# Patient Record
Sex: Male | Born: 1935 | Race: White | Hispanic: No | Marital: Single | Smoking: Former smoker
Health system: Southern US, Community
[De-identification: ages and names within clinical notes are randomized; demographics above are authoritative.]

## PROBLEM LIST (undated history)

## (undated) DIAGNOSIS — L308 Other specified dermatitis: Secondary | ICD-10-CM

## (undated) DIAGNOSIS — I509 Heart failure, unspecified: Secondary | ICD-10-CM

## (undated) DIAGNOSIS — I781 Nevus, non-neoplastic: Secondary | ICD-10-CM

## (undated) DIAGNOSIS — C4A9 Merkel cell carcinoma, unspecified: Principal | ICD-10-CM

## (undated) DIAGNOSIS — R269 Unspecified abnormalities of gait and mobility: Secondary | ICD-10-CM

## (undated) DIAGNOSIS — Z973 Presence of spectacles and contact lenses: Secondary | ICD-10-CM

## (undated) DIAGNOSIS — C4359 Malignant melanoma of other part of trunk: Secondary | ICD-10-CM

## (undated) DIAGNOSIS — F419 Anxiety disorder, unspecified: Secondary | ICD-10-CM

## (undated) DIAGNOSIS — K219 Gastro-esophageal reflux disease without esophagitis: Secondary | ICD-10-CM

## (undated) DIAGNOSIS — R296 Repeated falls: Secondary | ICD-10-CM

## (undated) DIAGNOSIS — I10 Essential (primary) hypertension: Secondary | ICD-10-CM

## (undated) DIAGNOSIS — M1990 Unspecified osteoarthritis, unspecified site: Secondary | ICD-10-CM

## (undated) DIAGNOSIS — L814 Other melanin hyperpigmentation: Secondary | ICD-10-CM

## (undated) DIAGNOSIS — M21969 Unspecified acquired deformity of unspecified lower leg: Secondary | ICD-10-CM

## (undated) DIAGNOSIS — L578 Other skin changes due to chronic exposure to nonionizing radiation: Secondary | ICD-10-CM

## (undated) DIAGNOSIS — H3530 Unspecified macular degeneration: Secondary | ICD-10-CM

## (undated) DIAGNOSIS — D649 Anemia, unspecified: Secondary | ICD-10-CM

## (undated) DIAGNOSIS — C4490 Unspecified malignant neoplasm of skin, unspecified: Secondary | ICD-10-CM

## (undated) DIAGNOSIS — Z923 Personal history of irradiation: Secondary | ICD-10-CM

## (undated) DIAGNOSIS — L821 Other seborrheic keratosis: Secondary | ICD-10-CM

## (undated) DIAGNOSIS — K5900 Constipation, unspecified: Secondary | ICD-10-CM

## (undated) DIAGNOSIS — Z8709 Personal history of other diseases of the respiratory system: Secondary | ICD-10-CM

## (undated) DIAGNOSIS — E871 Hypo-osmolality and hyponatremia: Secondary | ICD-10-CM

## (undated) DIAGNOSIS — Z8619 Personal history of other infectious and parasitic diseases: Secondary | ICD-10-CM

## (undated) DIAGNOSIS — E876 Hypokalemia: Secondary | ICD-10-CM

## (undated) DIAGNOSIS — K449 Diaphragmatic hernia without obstruction or gangrene: Secondary | ICD-10-CM

## (undated) DIAGNOSIS — D229 Melanocytic nevi, unspecified: Secondary | ICD-10-CM

## (undated) DIAGNOSIS — I350 Nonrheumatic aortic (valve) stenosis: Secondary | ICD-10-CM

## (undated) DIAGNOSIS — R0602 Shortness of breath: Secondary | ICD-10-CM

## (undated) DIAGNOSIS — I4891 Unspecified atrial fibrillation: Secondary | ICD-10-CM

## (undated) DIAGNOSIS — J189 Pneumonia, unspecified organism: Secondary | ICD-10-CM

## (undated) DIAGNOSIS — L0390 Cellulitis, unspecified: Secondary | ICD-10-CM

## (undated) DIAGNOSIS — D1801 Hemangioma of skin and subcutaneous tissue: Secondary | ICD-10-CM

## (undated) HISTORY — DX: Repeated falls: R29.6

## (undated) HISTORY — DX: Hypo-osmolality and hyponatremia: E87.1

## (undated) HISTORY — DX: Diaphragmatic hernia without obstruction or gangrene: K44.9

## (undated) HISTORY — DX: Anemia, unspecified: D64.9

## (undated) HISTORY — DX: Constipation, unspecified: K59.00

## (undated) HISTORY — PX: KNEE SURGERY: SHX244

## (undated) HISTORY — PX: MELANOMA EXCISION: SHX5266

## (undated) HISTORY — DX: Unspecified abnormalities of gait and mobility: R26.9

## (undated) HISTORY — PX: CATARACT EXTRACTION W/ INTRAOCULAR LENS  IMPLANT, BILATERAL: SHX1307

## (undated) HISTORY — DX: Nonrheumatic aortic (valve) stenosis: I35.0

## (undated) HISTORY — DX: Personal history of irradiation: Z92.3

---

## 1998-10-08 ENCOUNTER — Encounter: Payer: Self-pay | Admitting: Emergency Medicine

## 1998-10-08 ENCOUNTER — Emergency Department (HOSPITAL_COMMUNITY): Admission: EM | Admit: 1998-10-08 | Discharge: 1998-10-08 | Payer: Self-pay | Admitting: Emergency Medicine

## 1999-11-05 ENCOUNTER — Ambulatory Visit (HOSPITAL_COMMUNITY): Admission: RE | Admit: 1999-11-05 | Discharge: 1999-11-05 | Payer: Self-pay | Admitting: Gastroenterology

## 1999-11-05 ENCOUNTER — Encounter: Payer: Self-pay | Admitting: Gastroenterology

## 1999-11-12 ENCOUNTER — Encounter (INDEPENDENT_AMBULATORY_CARE_PROVIDER_SITE_OTHER): Payer: Self-pay | Admitting: Specialist

## 1999-11-12 ENCOUNTER — Other Ambulatory Visit: Admission: RE | Admit: 1999-11-12 | Discharge: 1999-11-12 | Payer: Self-pay | Admitting: Urology

## 1999-12-01 ENCOUNTER — Encounter: Payer: Self-pay | Admitting: Gastroenterology

## 1999-12-01 ENCOUNTER — Encounter: Admission: RE | Admit: 1999-12-01 | Discharge: 1999-12-01 | Payer: Self-pay | Admitting: Gastroenterology

## 2000-02-02 ENCOUNTER — Encounter: Admission: RE | Admit: 2000-02-02 | Discharge: 2000-05-02 | Payer: Self-pay | Admitting: Internal Medicine

## 2000-02-08 ENCOUNTER — Encounter: Payer: Self-pay | Admitting: Orthopedic Surgery

## 2000-05-10 ENCOUNTER — Encounter: Admission: RE | Admit: 2000-05-10 | Discharge: 2000-08-08 | Payer: Self-pay | Admitting: Internal Medicine

## 2000-08-08 ENCOUNTER — Encounter: Admission: RE | Admit: 2000-08-08 | Discharge: 2000-11-06 | Payer: Self-pay | Admitting: Orthopedic Surgery

## 2000-08-15 ENCOUNTER — Encounter: Payer: Self-pay | Admitting: Orthopedic Surgery

## 2000-11-14 ENCOUNTER — Encounter: Admission: RE | Admit: 2000-11-14 | Discharge: 2000-12-13 | Payer: Self-pay | Admitting: Orthopedic Surgery

## 2000-12-14 ENCOUNTER — Encounter: Admission: RE | Admit: 2000-12-14 | Discharge: 2001-03-14 | Payer: Self-pay | Admitting: Orthopedic Surgery

## 2001-03-17 ENCOUNTER — Encounter: Admission: RE | Admit: 2001-03-17 | Discharge: 2001-06-15 | Payer: Self-pay | Admitting: Orthopedic Surgery

## 2001-06-21 ENCOUNTER — Encounter: Admission: RE | Admit: 2001-06-21 | Discharge: 2001-08-17 | Payer: Self-pay | Admitting: Orthopedic Surgery

## 2001-08-21 ENCOUNTER — Encounter (HOSPITAL_BASED_OUTPATIENT_CLINIC_OR_DEPARTMENT_OTHER): Admission: RE | Admit: 2001-08-21 | Discharge: 2001-11-19 | Payer: Self-pay | Admitting: Orthopedic Surgery

## 2001-12-10 ENCOUNTER — Encounter (HOSPITAL_BASED_OUTPATIENT_CLINIC_OR_DEPARTMENT_OTHER): Admission: RE | Admit: 2001-12-10 | Discharge: 2001-12-13 | Payer: Self-pay | Admitting: Internal Medicine

## 2002-01-29 ENCOUNTER — Encounter (HOSPITAL_BASED_OUTPATIENT_CLINIC_OR_DEPARTMENT_OTHER): Admission: RE | Admit: 2002-01-29 | Discharge: 2002-04-17 | Payer: Self-pay | Admitting: Internal Medicine

## 2002-02-26 ENCOUNTER — Encounter (HOSPITAL_BASED_OUTPATIENT_CLINIC_OR_DEPARTMENT_OTHER): Admission: RE | Admit: 2002-02-26 | Discharge: 2002-05-27 | Payer: Self-pay | Admitting: Internal Medicine

## 2002-06-18 ENCOUNTER — Encounter (HOSPITAL_BASED_OUTPATIENT_CLINIC_OR_DEPARTMENT_OTHER): Admission: RE | Admit: 2002-06-18 | Discharge: 2002-09-16 | Payer: Self-pay | Admitting: Internal Medicine

## 2002-09-30 ENCOUNTER — Encounter (HOSPITAL_BASED_OUTPATIENT_CLINIC_OR_DEPARTMENT_OTHER): Admission: RE | Admit: 2002-09-30 | Discharge: 2002-12-29 | Payer: Self-pay | Admitting: Internal Medicine

## 2003-01-01 ENCOUNTER — Encounter (HOSPITAL_BASED_OUTPATIENT_CLINIC_OR_DEPARTMENT_OTHER): Admission: RE | Admit: 2003-01-01 | Discharge: 2003-04-01 | Payer: Self-pay | Admitting: Internal Medicine

## 2003-04-03 ENCOUNTER — Encounter (HOSPITAL_BASED_OUTPATIENT_CLINIC_OR_DEPARTMENT_OTHER): Admission: RE | Admit: 2003-04-03 | Discharge: 2003-06-27 | Payer: Self-pay | Admitting: Internal Medicine

## 2003-07-04 ENCOUNTER — Encounter (HOSPITAL_BASED_OUTPATIENT_CLINIC_OR_DEPARTMENT_OTHER): Admission: RE | Admit: 2003-07-04 | Discharge: 2003-10-02 | Payer: Self-pay | Admitting: Internal Medicine

## 2004-01-19 ENCOUNTER — Emergency Department (HOSPITAL_COMMUNITY): Admission: EM | Admit: 2004-01-19 | Discharge: 2004-01-19 | Payer: Self-pay | Admitting: Emergency Medicine

## 2004-04-06 ENCOUNTER — Encounter (HOSPITAL_BASED_OUTPATIENT_CLINIC_OR_DEPARTMENT_OTHER): Admission: RE | Admit: 2004-04-06 | Discharge: 2004-07-05 | Payer: Self-pay | Admitting: Internal Medicine

## 2004-07-06 ENCOUNTER — Encounter (HOSPITAL_BASED_OUTPATIENT_CLINIC_OR_DEPARTMENT_OTHER): Admission: RE | Admit: 2004-07-06 | Discharge: 2004-08-06 | Payer: Self-pay | Admitting: Internal Medicine

## 2004-07-21 ENCOUNTER — Ambulatory Visit: Payer: Self-pay | Admitting: Family Medicine

## 2004-08-16 ENCOUNTER — Encounter (HOSPITAL_BASED_OUTPATIENT_CLINIC_OR_DEPARTMENT_OTHER): Admission: RE | Admit: 2004-08-16 | Discharge: 2004-11-14 | Payer: Self-pay | Admitting: Surgery

## 2004-11-22 ENCOUNTER — Encounter (HOSPITAL_BASED_OUTPATIENT_CLINIC_OR_DEPARTMENT_OTHER): Admission: RE | Admit: 2004-11-22 | Discharge: 2005-02-20 | Payer: Self-pay | Admitting: Surgery

## 2005-02-16 ENCOUNTER — Encounter (HOSPITAL_BASED_OUTPATIENT_CLINIC_OR_DEPARTMENT_OTHER): Admission: RE | Admit: 2005-02-16 | Discharge: 2005-05-17 | Payer: Self-pay | Admitting: Surgery

## 2005-02-18 ENCOUNTER — Ambulatory Visit: Payer: Self-pay | Admitting: Family Medicine

## 2005-05-20 ENCOUNTER — Encounter (HOSPITAL_BASED_OUTPATIENT_CLINIC_OR_DEPARTMENT_OTHER): Admission: RE | Admit: 2005-05-20 | Discharge: 2005-08-18 | Payer: Self-pay | Admitting: Surgery

## 2005-07-19 ENCOUNTER — Ambulatory Visit: Payer: Self-pay | Admitting: Family Medicine

## 2005-08-19 ENCOUNTER — Encounter (HOSPITAL_BASED_OUTPATIENT_CLINIC_OR_DEPARTMENT_OTHER): Admission: RE | Admit: 2005-08-19 | Discharge: 2005-11-17 | Payer: Self-pay | Admitting: Internal Medicine

## 2005-11-22 ENCOUNTER — Encounter (HOSPITAL_BASED_OUTPATIENT_CLINIC_OR_DEPARTMENT_OTHER): Admission: RE | Admit: 2005-11-22 | Discharge: 2006-02-02 | Payer: Self-pay | Admitting: Surgery

## 2006-01-12 ENCOUNTER — Ambulatory Visit: Payer: Self-pay | Admitting: Family Medicine

## 2006-02-07 ENCOUNTER — Encounter (HOSPITAL_BASED_OUTPATIENT_CLINIC_OR_DEPARTMENT_OTHER): Admission: RE | Admit: 2006-02-07 | Discharge: 2006-02-09 | Payer: Self-pay | Admitting: Surgery

## 2006-02-15 ENCOUNTER — Encounter (HOSPITAL_BASED_OUTPATIENT_CLINIC_OR_DEPARTMENT_OTHER): Admission: RE | Admit: 2006-02-15 | Discharge: 2006-02-22 | Payer: Self-pay | Admitting: Surgery

## 2006-03-03 ENCOUNTER — Ambulatory Visit: Payer: Self-pay | Admitting: Family Medicine

## 2006-03-28 ENCOUNTER — Ambulatory Visit: Payer: Self-pay | Admitting: Family Medicine

## 2006-03-31 ENCOUNTER — Encounter (HOSPITAL_BASED_OUTPATIENT_CLINIC_OR_DEPARTMENT_OTHER): Admission: RE | Admit: 2006-03-31 | Discharge: 2006-05-15 | Payer: Self-pay | Admitting: Internal Medicine

## 2006-04-14 ENCOUNTER — Ambulatory Visit: Payer: Self-pay | Admitting: Family Medicine

## 2006-05-16 ENCOUNTER — Encounter (HOSPITAL_BASED_OUTPATIENT_CLINIC_OR_DEPARTMENT_OTHER): Admission: RE | Admit: 2006-05-16 | Discharge: 2006-08-14 | Payer: Self-pay | Admitting: Surgery

## 2006-07-27 ENCOUNTER — Ambulatory Visit: Payer: Self-pay | Admitting: Family Medicine

## 2006-08-17 ENCOUNTER — Encounter (HOSPITAL_BASED_OUTPATIENT_CLINIC_OR_DEPARTMENT_OTHER): Admission: RE | Admit: 2006-08-17 | Discharge: 2006-11-15 | Payer: Self-pay | Admitting: Internal Medicine

## 2006-11-16 ENCOUNTER — Encounter (HOSPITAL_BASED_OUTPATIENT_CLINIC_OR_DEPARTMENT_OTHER): Admission: RE | Admit: 2006-11-16 | Discharge: 2007-02-01 | Payer: Self-pay | Admitting: Surgery

## 2007-01-17 ENCOUNTER — Inpatient Hospital Stay (HOSPITAL_COMMUNITY): Admission: EM | Admit: 2007-01-17 | Discharge: 2007-01-25 | Payer: Self-pay | Admitting: Emergency Medicine

## 2007-01-17 ENCOUNTER — Ambulatory Visit: Payer: Self-pay | Admitting: Internal Medicine

## 2007-01-18 ENCOUNTER — Ambulatory Visit: Payer: Self-pay | Admitting: Vascular Surgery

## 2007-07-02 ENCOUNTER — Ambulatory Visit: Payer: Self-pay | Admitting: Internal Medicine

## 2007-07-02 ENCOUNTER — Encounter (INDEPENDENT_AMBULATORY_CARE_PROVIDER_SITE_OTHER): Payer: Self-pay | Admitting: Family Medicine

## 2007-07-02 LAB — CONVERTED CEMR LAB
ALT: 13 units/L (ref 0–53)
AST: 17 units/L (ref 0–37)
Albumin: 4.7 g/dL (ref 3.5–5.2)
Alkaline Phosphatase: 57 units/L (ref 39–117)
BUN: 15 mg/dL (ref 6–23)
Basophils Absolute: 0.1 10*3/uL (ref 0.0–0.1)
Basophils Relative: 1 % (ref 0–1)
CO2: 23 meq/L (ref 19–32)
Calcium: 9.4 mg/dL (ref 8.4–10.5)
Chloride: 94 meq/L — ABNORMAL LOW (ref 96–112)
Cholesterol: 182 mg/dL (ref 0–200)
Creatinine, Ser: 0.76 mg/dL (ref 0.40–1.50)
Eosinophils Absolute: 0.4 10*3/uL (ref 0.0–0.7)
Eosinophils Relative: 3 % (ref 0–5)
Glucose, Bld: 91 mg/dL (ref 70–99)
HCT: 37 % — ABNORMAL LOW (ref 39.0–52.0)
HDL: 65 mg/dL (ref >39)
Hemoglobin: 12.5 g/dL — ABNORMAL LOW (ref 13.0–17.0)
LDL Cholesterol: 95 mg/dL (ref 0–99)
Lymphocytes Relative: 21 % (ref 12–46)
Lymphs Abs: 2.2 10*3/uL (ref 0.7–4.0)
MCHC: 33.8 g/dL (ref 30.0–36.0)
MCV: 87.5 fL (ref 78.0–100.0)
Monocytes Absolute: 0.7 10*3/uL (ref 0.1–1.0)
Monocytes Relative: 6 % (ref 3–12)
Neutro Abs: 7.3 10*3/uL (ref 1.7–7.7)
Neutrophils Relative %: 69 % (ref 43–77)
Platelets: 319 10*3/uL (ref 150–400)
Potassium: 4.4 meq/L (ref 3.5–5.3)
RBC: 4.23 M/uL (ref 4.22–5.81)
RDW: 12.8 % (ref 11.5–15.5)
Sodium: 133 meq/L — ABNORMAL LOW (ref 135–145)
Total Bilirubin: 0.4 mg/dL (ref 0.3–1.2)
Total CHOL/HDL Ratio: 2.8
Total Protein: 7.4 g/dL (ref 6.0–8.3)
Triglycerides: 112 mg/dL (ref <150)
VLDL: 22 mg/dL (ref 0–40)
WBC: 10.6 10*3/uL — ABNORMAL HIGH (ref 4.0–10.5)

## 2007-07-11 ENCOUNTER — Encounter (INDEPENDENT_AMBULATORY_CARE_PROVIDER_SITE_OTHER): Payer: Self-pay | Admitting: Nurse Practitioner

## 2007-07-11 ENCOUNTER — Ambulatory Visit: Payer: Self-pay | Admitting: Internal Medicine

## 2007-07-11 LAB — CONVERTED CEMR LAB
Ferritin: 142 ng/mL (ref 22–322)
Iron: 33 ug/dL — ABNORMAL LOW (ref 42–165)
Osmolality: 275 mOsm/kg (ref 275–300)
Saturation Ratios: 11 % — ABNORMAL LOW (ref 20–55)
TIBC: 291 ug/dL (ref 215–435)
UIBC: 258 ug/dL

## 2009-04-02 ENCOUNTER — Encounter (HOSPITAL_BASED_OUTPATIENT_CLINIC_OR_DEPARTMENT_OTHER): Admission: RE | Admit: 2009-04-02 | Discharge: 2009-05-18 | Payer: Self-pay | Admitting: Internal Medicine

## 2009-04-08 ENCOUNTER — Ambulatory Visit (HOSPITAL_COMMUNITY): Admission: RE | Admit: 2009-04-08 | Discharge: 2009-04-08 | Payer: Self-pay | Admitting: General Surgery

## 2009-05-19 ENCOUNTER — Encounter (HOSPITAL_BASED_OUTPATIENT_CLINIC_OR_DEPARTMENT_OTHER): Admission: RE | Admit: 2009-05-19 | Discharge: 2009-08-17 | Payer: Self-pay | Admitting: Internal Medicine

## 2009-07-20 ENCOUNTER — Encounter (HOSPITAL_BASED_OUTPATIENT_CLINIC_OR_DEPARTMENT_OTHER): Payer: Self-pay | Admitting: Internal Medicine

## 2009-07-20 ENCOUNTER — Ambulatory Visit: Payer: Self-pay | Admitting: Vascular Surgery

## 2009-07-20 ENCOUNTER — Ambulatory Visit: Admission: RE | Admit: 2009-07-20 | Discharge: 2009-07-20 | Payer: Self-pay | Admitting: Internal Medicine

## 2009-07-22 ENCOUNTER — Inpatient Hospital Stay (HOSPITAL_COMMUNITY): Admission: EM | Admit: 2009-07-22 | Discharge: 2009-07-27 | Payer: Self-pay | Admitting: Emergency Medicine

## 2009-08-21 ENCOUNTER — Encounter (HOSPITAL_BASED_OUTPATIENT_CLINIC_OR_DEPARTMENT_OTHER): Admission: RE | Admit: 2009-08-21 | Discharge: 2009-11-13 | Payer: Self-pay | Admitting: Internal Medicine

## 2009-08-31 ENCOUNTER — Emergency Department (HOSPITAL_COMMUNITY): Admission: EM | Admit: 2009-08-31 | Discharge: 2009-08-31 | Payer: Self-pay | Admitting: Emergency Medicine

## 2009-09-10 ENCOUNTER — Ambulatory Visit: Payer: Self-pay | Admitting: Surgery

## 2009-09-10 ENCOUNTER — Inpatient Hospital Stay (HOSPITAL_COMMUNITY): Admission: EM | Admit: 2009-09-10 | Discharge: 2009-09-15 | Payer: Self-pay | Admitting: Emergency Medicine

## 2009-09-10 ENCOUNTER — Encounter (INDEPENDENT_AMBULATORY_CARE_PROVIDER_SITE_OTHER): Payer: Self-pay | Admitting: Internal Medicine

## 2009-09-14 ENCOUNTER — Encounter (INDEPENDENT_AMBULATORY_CARE_PROVIDER_SITE_OTHER): Payer: Self-pay | Admitting: Internal Medicine

## 2009-11-20 ENCOUNTER — Encounter (HOSPITAL_BASED_OUTPATIENT_CLINIC_OR_DEPARTMENT_OTHER): Admission: RE | Admit: 2009-11-20 | Discharge: 2010-02-18 | Payer: Self-pay | Admitting: Internal Medicine

## 2010-02-22 ENCOUNTER — Encounter (HOSPITAL_BASED_OUTPATIENT_CLINIC_OR_DEPARTMENT_OTHER)
Admission: RE | Admit: 2010-02-22 | Discharge: 2010-05-20 | Payer: Self-pay | Source: Home / Self Care | Attending: Internal Medicine | Admitting: Internal Medicine

## 2010-05-21 ENCOUNTER — Encounter (HOSPITAL_BASED_OUTPATIENT_CLINIC_OR_DEPARTMENT_OTHER)
Admission: RE | Admit: 2010-05-21 | Discharge: 2010-06-15 | Payer: Self-pay | Source: Home / Self Care | Attending: Internal Medicine | Admitting: Internal Medicine

## 2010-05-30 ENCOUNTER — Emergency Department (HOSPITAL_COMMUNITY)
Admission: EM | Admit: 2010-05-30 | Discharge: 2010-05-31 | Disposition: A | Discharge status: Other Acute Inpatient Hospital | Payer: Self-pay | Source: Home / Self Care | Admitting: Emergency Medicine

## 2010-05-31 LAB — CBC
HCT: 32.5 % — ABNORMAL LOW (ref 39.0–52.0)
Hemoglobin: 10.8 g/dL — ABNORMAL LOW (ref 13.0–17.0)
MCH: 30.2 pg (ref 26.0–34.0)
MCHC: 33.2 g/dL (ref 30.0–36.0)
MCV: 90.8 fL (ref 78.0–100.0)
Platelets: 214 10*3/uL (ref 150–400)
RBC: 3.58 MIL/uL — ABNORMAL LOW (ref 4.22–5.81)
RDW: 13.7 % (ref 11.5–15.5)
WBC: 8.9 10*3/uL (ref 4.0–10.5)

## 2010-05-31 LAB — URINALYSIS, ROUTINE W REFLEX MICROSCOPIC
Bilirubin Urine: NEGATIVE
Hgb urine dipstick: NEGATIVE
Ketones, ur: NEGATIVE mg/dL
Nitrite: NEGATIVE
Protein, ur: NEGATIVE mg/dL
Specific Gravity, Urine: 1.009 (ref 1.005–1.030)
Urine Glucose, Fasting: NEGATIVE mg/dL
Urobilinogen, UA: 0.2 mg/dL (ref 0.0–1.0)
pH: 7.5 (ref 5.0–8.0)

## 2010-05-31 LAB — COMPREHENSIVE METABOLIC PANEL
ALT: 19 U/L (ref 0–53)
AST: 35 U/L (ref 0–37)
Albumin: 3.7 g/dL (ref 3.5–5.2)
Alkaline Phosphatase: 53 U/L (ref 39–117)
BUN: 18 mg/dL (ref 6–23)
CO2: 27 mEq/L (ref 19–32)
Calcium: 8.9 mg/dL (ref 8.4–10.5)
Chloride: 102 mEq/L (ref 96–112)
Creatinine, Ser: 0.81 mg/dL (ref 0.4–1.5)
GFR calc Af Amer: >60 mL/min (ref >60)
GFR calc non Af Amer: >60 mL/min (ref >60)
Glucose, Bld: 113 mg/dL — ABNORMAL HIGH (ref 70–99)
Potassium: 4.5 mEq/L (ref 3.5–5.1)
Sodium: 137 mEq/L (ref 135–145)
Total Bilirubin: 0.9 mg/dL (ref 0.3–1.2)
Total Protein: 7.1 g/dL (ref 6.0–8.3)

## 2010-05-31 LAB — POCT CARDIAC MARKERS
CKMB, poc: 2.3 ng/mL (ref 1.0–8.0)
CKMB, poc: 2.7 ng/mL (ref 1.0–8.0)
Myoglobin, poc: 129 ng/mL (ref 12–200)
Myoglobin, poc: 81 ng/mL (ref 12–200)
Troponin i, poc: <0.05 ng/mL (ref 0.00–0.09)
Troponin i, poc: <0.05 ng/mL (ref 0.00–0.09)

## 2010-05-31 LAB — DIFFERENTIAL
Basophils Absolute: 0 10*3/uL (ref 0.0–0.1)
Basophils Relative: 0 % (ref 0–1)
Eosinophils Absolute: 0.2 10*3/uL (ref 0.0–0.7)
Eosinophils Relative: 2 % (ref 0–5)
Lymphocytes Relative: 12 % (ref 12–46)
Lymphs Abs: 1.1 10*3/uL (ref 0.7–4.0)
Monocytes Absolute: 0.5 10*3/uL (ref 0.1–1.0)
Monocytes Relative: 6 % (ref 3–12)
Neutro Abs: 7 10*3/uL (ref 1.7–7.7)
Neutrophils Relative %: 79 % — ABNORMAL HIGH (ref 43–77)

## 2010-06-02 LAB — BRAIN NATRIURETIC PEPTIDE: Pro B Natriuretic peptide (BNP): 407 pg/mL — ABNORMAL HIGH (ref 0.0–100.0)

## 2010-07-05 ENCOUNTER — Encounter (HOSPITAL_BASED_OUTPATIENT_CLINIC_OR_DEPARTMENT_OTHER): Payer: Medicare Other | Attending: Plastic Surgery

## 2010-07-05 DIAGNOSIS — L97409 Non-pressure chronic ulcer of unspecified heel and midfoot with unspecified severity: Secondary | ICD-10-CM | POA: Insufficient documentation

## 2010-07-05 DIAGNOSIS — G5790 Unspecified mononeuropathy of unspecified lower limb: Secondary | ICD-10-CM | POA: Insufficient documentation

## 2010-08-02 ENCOUNTER — Encounter (HOSPITAL_BASED_OUTPATIENT_CLINIC_OR_DEPARTMENT_OTHER): Payer: Medicare Other | Attending: Plastic Surgery

## 2010-08-02 DIAGNOSIS — G5790 Unspecified mononeuropathy of unspecified lower limb: Secondary | ICD-10-CM | POA: Insufficient documentation

## 2010-08-02 DIAGNOSIS — L97409 Non-pressure chronic ulcer of unspecified heel and midfoot with unspecified severity: Secondary | ICD-10-CM | POA: Insufficient documentation

## 2010-08-03 LAB — DIFFERENTIAL
Basophils Absolute: 0 10*3/uL (ref 0.0–0.1)
Basophils Relative: 0 % (ref 0–1)
Eosinophils Absolute: 0 10*3/uL (ref 0.0–0.7)
Lymphocytes Relative: 1 % — ABNORMAL LOW (ref 12–46)
Lymphocytes Relative: 2 % — ABNORMAL LOW (ref 12–46)
Monocytes Relative: 2 % — ABNORMAL LOW (ref 3–12)
Monocytes Relative: 3 % (ref 3–12)
Neutro Abs: 21.5 10*3/uL — ABNORMAL HIGH (ref 1.7–7.7)
Neutro Abs: 26 10*3/uL — ABNORMAL HIGH (ref 1.7–7.7)
Neutrophils Relative %: 96 % — ABNORMAL HIGH (ref 43–77)
WBC Morphology: INCREASED

## 2010-08-03 LAB — HEMOGLOBIN A1C: Mean Plasma Glucose: 123 mg/dL — ABNORMAL HIGH (ref <117)

## 2010-08-03 LAB — BASIC METABOLIC PANEL
BUN: 14 mg/dL (ref 6–23)
BUN: 18 mg/dL (ref 6–23)
CO2: 30 mEq/L (ref 19–32)
Calcium: 8.1 mg/dL — ABNORMAL LOW (ref 8.4–10.5)
Calcium: 8.2 mg/dL — ABNORMAL LOW (ref 8.4–10.5)
Calcium: 8.9 mg/dL (ref 8.4–10.5)
Creatinine, Ser: 0.96 mg/dL (ref 0.4–1.5)
Creatinine, Ser: 1.04 mg/dL (ref 0.4–1.5)
GFR calc Af Amer: >60 mL/min (ref >60)
GFR calc non Af Amer: >60 mL/min (ref >60)
GFR calc non Af Amer: >60 mL/min (ref >60)
Glucose, Bld: 142 mg/dL — ABNORMAL HIGH (ref 70–99)
Potassium: 3.7 mEq/L (ref 3.5–5.1)
Sodium: 137 mEq/L (ref 135–145)

## 2010-08-03 LAB — CBC
HCT: 26.3 % — ABNORMAL LOW (ref 39.0–52.0)
HCT: 22.2 % — ABNORMAL LOW (ref 39.0–52.0)
Hemoglobin: 8.8 g/dL — ABNORMAL LOW (ref 13.0–17.0)
Hemoglobin: 7.7 g/dL — ABNORMAL LOW (ref 13.0–17.0)
MCHC: 33.3 g/dL (ref 30.0–36.0)
MCHC: 33.7 g/dL (ref 30.0–36.0)
MCV: 91 fL (ref 78.0–100.0)
MCV: 90.6 fL (ref 78.0–100.0)
Platelets: 232 10*3/uL (ref 150–400)
Platelets: 174 10*3/uL (ref 150–400)
Platelets: 200 10*3/uL (ref 150–400)
Platelets: 235 10*3/uL (ref 150–400)
RBC: 2.89 MIL/uL — ABNORMAL LOW (ref 4.22–5.81)
RBC: 2.5 MIL/uL — ABNORMAL LOW (ref 4.22–5.81)
RDW: 15.6 % — ABNORMAL HIGH (ref 11.5–15.5)
RDW: 15.7 % — ABNORMAL HIGH (ref 11.5–15.5)
RDW: 16.1 % — ABNORMAL HIGH (ref 11.5–15.5)
WBC: 7.4 10*3/uL (ref 4.0–10.5)
WBC: 11.7 10*3/uL — ABNORMAL HIGH (ref 4.0–10.5)
WBC: 27.1 10*3/uL — ABNORMAL HIGH (ref 4.0–10.5)

## 2010-08-03 LAB — CULTURE, BLOOD (ROUTINE X 2)

## 2010-08-03 LAB — GLUCOSE, CAPILLARY
Glucose-Capillary: 117 mg/dL — ABNORMAL HIGH (ref 70–99)
Glucose-Capillary: 124 mg/dL — ABNORMAL HIGH (ref 70–99)

## 2010-08-03 LAB — POCT CARDIAC MARKERS
CKMB, poc: 1.1 ng/mL (ref 1.0–8.0)
Myoglobin, poc: 112 ng/mL (ref 12–200)

## 2010-08-03 LAB — COMPREHENSIVE METABOLIC PANEL
AST: 17 U/L (ref 0–37)
Albumin: 3 g/dL — ABNORMAL LOW (ref 3.5–5.2)
Alkaline Phosphatase: 38 U/L — ABNORMAL LOW (ref 39–117)
BUN: 17 mg/dL (ref 6–23)
Creatinine, Ser: 1.17 mg/dL (ref 0.4–1.5)
GFR calc Af Amer: >60 mL/min (ref >60)
Potassium: 3.7 mEq/L (ref 3.5–5.1)
Total Protein: 6.2 g/dL (ref 6.0–8.3)

## 2010-08-03 LAB — BRAIN NATRIURETIC PEPTIDE: Pro B Natriuretic peptide (BNP): 682 pg/mL — ABNORMAL HIGH (ref 0.0–100.0)

## 2010-08-03 LAB — URINE CULTURE: Colony Count: 7000

## 2010-08-03 LAB — IRON AND TIBC
Iron: 17 ug/dL — ABNORMAL LOW (ref 42–135)
Saturation Ratios: 10 % — ABNORMAL LOW (ref 20–55)
TIBC: 178 ug/dL — ABNORMAL LOW (ref 215–435)

## 2010-08-03 LAB — URINALYSIS, ROUTINE W REFLEX MICROSCOPIC
Bilirubin Urine: NEGATIVE
Glucose, UA: NEGATIVE mg/dL
Specific Gravity, Urine: 1.014 (ref 1.005–1.030)
Urobilinogen, UA: 0.2 mg/dL (ref 0.0–1.0)

## 2010-08-03 LAB — VITAMIN B12: Vitamin B-12: 434 pg/mL (ref 211–911)

## 2010-08-03 LAB — RETICULOCYTES: Retic Ct Pct: 1.5 % (ref 0.4–3.1)

## 2010-08-03 LAB — LACTIC ACID, PLASMA: Lactic Acid, Venous: 1.4 mmol/L (ref 0.5–2.2)

## 2010-08-03 LAB — TSH: TSH: 0.86 u[IU]/mL (ref 0.350–4.500)

## 2010-08-03 LAB — URINE MICROSCOPIC-ADD ON

## 2010-08-08 LAB — BASIC METABOLIC PANEL
BUN: 12 mg/dL (ref 6–23)
CO2: 25 mEq/L (ref 19–32)
CO2: 25 mEq/L (ref 19–32)
CO2: 26 mEq/L (ref 19–32)
Calcium: 8 mg/dL — ABNORMAL LOW (ref 8.4–10.5)
Calcium: 8.2 mg/dL — ABNORMAL LOW (ref 8.4–10.5)
Calcium: 8.2 mg/dL — ABNORMAL LOW (ref 8.4–10.5)
Chloride: 95 mEq/L — ABNORMAL LOW (ref 96–112)
Chloride: 98 mEq/L (ref 96–112)
Chloride: 98 mEq/L (ref 96–112)
Creatinine, Ser: 1.47 mg/dL (ref 0.4–1.5)
Creatinine, Ser: 1.5 mg/dL (ref 0.4–1.5)
Creatinine, Ser: 1.62 mg/dL — ABNORMAL HIGH (ref 0.4–1.5)
GFR calc Af Amer: 50 mL/min — ABNORMAL LOW (ref >60)
GFR calc Af Amer: 54 mL/min — ABNORMAL LOW (ref >60)
GFR calc Af Amer: 54 mL/min — ABNORMAL LOW (ref >60)
GFR calc non Af Amer: 41 mL/min — ABNORMAL LOW (ref >60)
GFR calc non Af Amer: 45 mL/min — ABNORMAL LOW (ref >60)
Glucose, Bld: 109 mg/dL — ABNORMAL HIGH (ref 70–99)
Glucose, Bld: 118 mg/dL — ABNORMAL HIGH (ref 70–99)
Glucose, Bld: 89 mg/dL (ref 70–99)
Potassium: 4.3 mEq/L (ref 3.5–5.1)
Potassium: 4.4 mEq/L (ref 3.5–5.1)
Potassium: 4.6 mEq/L (ref 3.5–5.1)
Sodium: 129 mEq/L — ABNORMAL LOW (ref 135–145)
Sodium: 129 mEq/L — ABNORMAL LOW (ref 135–145)
Sodium: 131 mEq/L — ABNORMAL LOW (ref 135–145)
Sodium: 133 mEq/L — ABNORMAL LOW (ref 135–145)

## 2010-08-08 LAB — URINE CULTURE: Culture: NO GROWTH

## 2010-08-08 LAB — CULTURE, BLOOD (ROUTINE X 2): Culture: NO GROWTH

## 2010-08-08 LAB — URINALYSIS, ROUTINE W REFLEX MICROSCOPIC
Leukocytes, UA: NEGATIVE
Nitrite: NEGATIVE
Protein, ur: NEGATIVE mg/dL
Specific Gravity, Urine: 1.011 (ref 1.005–1.030)
Urobilinogen, UA: 0.2 mg/dL (ref 0.0–1.0)

## 2010-08-08 LAB — DIFFERENTIAL
Basophils Absolute: 0 10*3/uL (ref 0.0–0.1)
Basophils Relative: 0 % (ref 0–1)
Eosinophils Absolute: 0.2 10*3/uL (ref 0.0–0.7)
Eosinophils Relative: 2 % (ref 0–5)
Monocytes Absolute: 0.6 10*3/uL (ref 0.1–1.0)

## 2010-08-08 LAB — CBC
HCT: 27.9 % — ABNORMAL LOW (ref 39.0–52.0)
HCT: 30.4 % — ABNORMAL LOW (ref 39.0–52.0)
Hemoglobin: 9.1 g/dL — ABNORMAL LOW (ref 13.0–17.0)
Hemoglobin: 9.2 g/dL — ABNORMAL LOW (ref 13.0–17.0)
Hemoglobin: 9.9 g/dL — ABNORMAL LOW (ref 13.0–17.0)
MCHC: 32.4 g/dL (ref 30.0–36.0)
MCV: 90 fL (ref 78.0–100.0)
MCV: 90.7 fL (ref 78.0–100.0)
Platelets: 393 10*3/uL (ref 150–400)
RBC: 3.09 MIL/uL — ABNORMAL LOW (ref 4.22–5.81)
RDW: 14.2 % (ref 11.5–15.5)
RDW: 14.2 % (ref 11.5–15.5)
WBC: 9 10*3/uL (ref 4.0–10.5)

## 2010-08-08 LAB — WOUND CULTURE: Culture: NO GROWTH

## 2010-08-08 LAB — TSH: TSH: 0.721 u[IU]/mL (ref 0.350–4.500)

## 2010-08-08 LAB — URINE MICROSCOPIC-ADD ON

## 2010-08-21 ENCOUNTER — Inpatient Hospital Stay (HOSPITAL_COMMUNITY)
Admission: EM | Admit: 2010-08-21 | Discharge: 2010-08-26 | DRG: 309 | Disposition: A | Discharge status: Home Health | Payer: Medicare Other | Attending: Internal Medicine | Admitting: Internal Medicine

## 2010-08-21 ENCOUNTER — Emergency Department (HOSPITAL_COMMUNITY): Payer: Medicare Other

## 2010-08-21 DIAGNOSIS — I4891 Unspecified atrial fibrillation: Principal | ICD-10-CM | POA: Diagnosis present

## 2010-08-21 DIAGNOSIS — J209 Acute bronchitis, unspecified: Secondary | ICD-10-CM | POA: Diagnosis present

## 2010-08-21 DIAGNOSIS — I080 Rheumatic disorders of both mitral and aortic valves: Secondary | ICD-10-CM | POA: Diagnosis present

## 2010-08-21 DIAGNOSIS — A5211 Tabes dorsalis: Secondary | ICD-10-CM | POA: Diagnosis present

## 2010-08-21 DIAGNOSIS — K7689 Other specified diseases of liver: Secondary | ICD-10-CM | POA: Diagnosis present

## 2010-08-21 DIAGNOSIS — E871 Hypo-osmolality and hyponatremia: Secondary | ICD-10-CM | POA: Diagnosis present

## 2010-08-21 DIAGNOSIS — N400 Enlarged prostate without lower urinary tract symptoms: Secondary | ICD-10-CM | POA: Diagnosis present

## 2010-08-21 DIAGNOSIS — F411 Generalized anxiety disorder: Secondary | ICD-10-CM | POA: Diagnosis present

## 2010-08-21 DIAGNOSIS — F341 Dysthymic disorder: Secondary | ICD-10-CM | POA: Diagnosis present

## 2010-08-21 DIAGNOSIS — D649 Anemia, unspecified: Secondary | ICD-10-CM | POA: Diagnosis present

## 2010-08-21 DIAGNOSIS — I10 Essential (primary) hypertension: Secondary | ICD-10-CM | POA: Diagnosis present

## 2010-08-21 DIAGNOSIS — K449 Diaphragmatic hernia without obstruction or gangrene: Secondary | ICD-10-CM | POA: Diagnosis present

## 2010-08-22 ENCOUNTER — Inpatient Hospital Stay (HOSPITAL_COMMUNITY): Payer: Medicare Other

## 2010-08-22 DIAGNOSIS — I059 Rheumatic mitral valve disease, unspecified: Secondary | ICD-10-CM

## 2010-08-22 DIAGNOSIS — I4891 Unspecified atrial fibrillation: Secondary | ICD-10-CM

## 2010-08-22 LAB — POCT I-STAT, CHEM 8
Calcium, Ion: 1.14 mmol/L (ref 1.12–1.32)
Chloride: 97 mEq/L (ref 96–112)
Creatinine, Ser: 1 mg/dL (ref 0.4–1.5)
Glucose, Bld: 102 mg/dL — ABNORMAL HIGH (ref 70–99)
HCT: 35 % — ABNORMAL LOW (ref 39.0–52.0)
Potassium: 4 mEq/L (ref 3.5–5.1)

## 2010-08-22 LAB — CBC
HCT: 33.1 % — ABNORMAL LOW (ref 39.0–52.0)
HCT: 33.3 % — ABNORMAL LOW (ref 39.0–52.0)
MCH: 30.3 pg (ref 26.0–34.0)
MCHC: 32.6 g/dL (ref 30.0–36.0)
MCHC: 33 g/dL (ref 30.0–36.0)
MCV: 91.7 fL (ref 78.0–100.0)
Platelets: 198 10*3/uL (ref 150–400)
Platelets: 209 10*3/uL (ref 150–400)
RDW: 13 % (ref 11.5–15.5)
RDW: 13.1 % (ref 11.5–15.5)

## 2010-08-22 LAB — PROTIME-INR: INR: 1.07 (ref 0.00–1.49)

## 2010-08-22 LAB — BASIC METABOLIC PANEL
BUN: 15 mg/dL (ref 6–23)
Calcium: 8.3 mg/dL — ABNORMAL LOW (ref 8.4–10.5)
Chloride: 97 mEq/L (ref 96–112)
Creatinine, Ser: 0.78 mg/dL (ref 0.4–1.5)

## 2010-08-22 LAB — CARDIAC PANEL(CRET KIN+CKTOT+MB+TROPI)
Relative Index: INVALID (ref 0.0–2.5)
Total CK: 92 U/L (ref 7–232)

## 2010-08-22 LAB — CK TOTAL AND CKMB (NOT AT ARMC)
CK, MB: 3.6 ng/mL (ref 0.3–4.0)
Relative Index: INVALID (ref 0.0–2.5)
Relative Index: INVALID (ref 0.0–2.5)
Total CK: 85 U/L (ref 7–232)

## 2010-08-22 LAB — DIFFERENTIAL
Eosinophils Absolute: 0.2 10*3/uL (ref 0.0–0.7)
Eosinophils Relative: 2 % (ref 0–5)
Lymphocytes Relative: 18 % (ref 12–46)
Lymphs Abs: 1.6 10*3/uL (ref 0.7–4.0)
Monocytes Absolute: 0.6 10*3/uL (ref 0.1–1.0)

## 2010-08-22 LAB — TROPONIN I: Troponin I: 0.01 ng/mL (ref 0.00–0.06)

## 2010-08-22 LAB — MRSA PCR SCREENING: MRSA by PCR: NEGATIVE

## 2010-08-22 LAB — HEPARIN LEVEL (UNFRACTIONATED): Heparin Unfractionated: 0.34 IU/mL (ref 0.30–0.70)

## 2010-08-22 LAB — TSH: TSH: 1.179 u[IU]/mL (ref 0.350–4.500)

## 2010-08-22 MED ORDER — IOHEXOL 300 MG/ML  SOLN
100.0000 mL | Freq: Once | INTRAMUSCULAR | Status: AC | PRN
  Administered 2010-08-22: 100 mL via INTRAVENOUS

## 2010-08-23 LAB — CBC
MCH: 29.8 pg (ref 26.0–34.0)
MCHC: 32.8 g/dL (ref 30.0–36.0)
Platelets: 234 10*3/uL (ref 150–400)
RBC: 3.79 MIL/uL — ABNORMAL LOW (ref 4.22–5.81)

## 2010-08-23 LAB — PROTIME-INR: INR: 1.07 (ref 0.00–1.49)

## 2010-08-23 LAB — BASIC METABOLIC PANEL
BUN: 11 mg/dL (ref 6–23)
Creatinine, Ser: 0.72 mg/dL (ref 0.4–1.5)
GFR calc non Af Amer: >60 mL/min (ref >60)

## 2010-08-23 LAB — HEPARIN LEVEL (UNFRACTIONATED)
Heparin Unfractionated: <0.1 IU/mL — ABNORMAL LOW (ref 0.30–0.70)
Heparin Unfractionated: 0.25 IU/mL — ABNORMAL LOW (ref 0.30–0.70)

## 2010-08-23 LAB — LIPID PANEL: Cholesterol: 122 mg/dL (ref 0–200)

## 2010-08-23 LAB — CARDIAC PANEL(CRET KIN+CKTOT+MB+TROPI)
CK, MB: 5.3 ng/mL — ABNORMAL HIGH (ref 0.3–4.0)
Total CK: 253 U/L — ABNORMAL HIGH (ref 7–232)
Troponin I: 0.02 ng/mL (ref 0.00–0.06)

## 2010-08-24 ENCOUNTER — Inpatient Hospital Stay (HOSPITAL_COMMUNITY): Payer: Medicare Other

## 2010-08-24 LAB — CARDIAC PANEL(CRET KIN+CKTOT+MB+TROPI)
CK, MB: 3.9 ng/mL (ref 0.3–4.0)
Relative Index: 2.1 (ref 0.0–2.5)
Total CK: 204 U/L (ref 7–232)
Troponin I: 0.03 ng/mL (ref 0.00–0.06)

## 2010-08-24 LAB — BASIC METABOLIC PANEL
BUN: 10 mg/dL (ref 6–23)
CO2: 28 mEq/L (ref 19–32)
Calcium: 8 mg/dL — ABNORMAL LOW (ref 8.4–10.5)
Chloride: 96 mEq/L (ref 96–112)
Creatinine, Ser: 0.74 mg/dL (ref 0.4–1.5)

## 2010-08-24 LAB — OSMOLALITY: Osmolality: 260 mOsm/kg — ABNORMAL LOW (ref 275–300)

## 2010-08-25 LAB — CBC
HCT: 34 % — ABNORMAL LOW (ref 39.0–52.0)
Hemoglobin: 11.3 g/dL — ABNORMAL LOW (ref 13.0–17.0)
MCH: 29.8 pg (ref 26.0–34.0)
MCV: 89.7 fL (ref 78.0–100.0)
RBC: 3.79 MIL/uL — ABNORMAL LOW (ref 4.22–5.81)

## 2010-08-25 LAB — BASIC METABOLIC PANEL
Chloride: 96 mEq/L (ref 96–112)
Creatinine, Ser: 0.73 mg/dL (ref 0.4–1.5)
GFR calc Af Amer: >60 mL/min (ref >60)
Potassium: 4.4 mEq/L (ref 3.5–5.1)
Sodium: 132 mEq/L — ABNORMAL LOW (ref 135–145)

## 2010-08-25 LAB — PROTIME-INR: Prothrombin Time: 16.6 seconds — ABNORMAL HIGH (ref 11.6–15.2)

## 2010-08-25 LAB — BRAIN NATRIURETIC PEPTIDE: Pro B Natriuretic peptide (BNP): 215 pg/mL — ABNORMAL HIGH (ref 0.0–100.0)

## 2010-08-26 LAB — BASIC METABOLIC PANEL
BUN: 10 mg/dL (ref 6–23)
Calcium: 8.9 mg/dL (ref 8.4–10.5)
GFR calc non Af Amer: >60 mL/min (ref >60)
Glucose, Bld: 96 mg/dL (ref 70–99)
Sodium: 132 mEq/L — ABNORMAL LOW (ref 135–145)

## 2010-08-26 LAB — PROTIME-INR
INR: 1.47 (ref 0.00–1.49)
Prothrombin Time: 18 seconds — ABNORMAL HIGH (ref 11.6–15.2)

## 2010-08-28 NOTE — Discharge Summary (Signed)
NAME:  [name], [name]               ACCOUNT NO.:  [id number]  MEDICAL RECORD NO.:  [id number]           PATIENT TYPE:  I  LOCATION:  1424                         FACILITY:  [facility]  PHYSICIAN:  Hillery Aldo, M.D.   DATE OF BIRTH:  [birth year]  DATE OF ADMISSION:  08/21/2010 DATE OF DISCHARGE:  08/26/2010                              DISCHARGE SUMMARY   PRIMARY CARE PHYSICIAN:  Dr. Florentina Jenny.  CARDIOLOGIST:  Dr. Roger Shelter.  DISCHARGE DIAGNOSES: 1. Atrial fibrillation with rapid ventricular response. 2. Acute bronchitis. 3. Chronic mild hyponatremia. 4. Charcot joint with chronic wound of the bilateral plantar surface     of the feet. 5. Hypertension. 6. Benign prostatic hypertrophy. 7. Hiatal hernia. 8. Chronic normocytic anemia. 9. Generalized anxiety disorder. 10.Indeterminate liver lesion, outpatient MRI with contrast     recommended for further characterization.  DISCHARGE MEDICATIONS: 1. Azithromycin 250 mg 2 tabs p.o. daily x2 more days. 2. Tessalon Perles 100 mg p.o. q.8 h. p.r.n. cough. 3. Lovenox 70 mg subcutaneously b.i.d. until INR is therapeutic x48     hours. 4. Warfarin 7.5 mg p.o. daily or as directed by primary care physician     to maintain an INR between 2 and 3. 5. Xanax 0.25 mg p.o. b.i.d. p.r.n. anxiety. 6. Atenolol 25 mg p.o. b.i.d. 7. Tylenol 500 mg p.o. q.6 h. p.r.n. pain. 8. Aspirin 81 mg p.o. daily. 9. Avodart 0.5 mg p.o. daily. 10.Bacitracin one application topically to feet three times a week. 11.Chloraseptic throat spray, 2 sprays by mouth q.2 h. p.r.n. throat     pain. 12.Decubi-Vite 400/50/500 one capsule p.o. daily. 13.Docusate sodium 100 mg p.o. b.i.d. 14.Ferrous sulfate 325 mg p.o. b.i.d. 15.Hydrochlorothiazide 12.5 mg p.o. daily. 16.Losartan 100 mg p.o. daily. 17.Mylanta 30 cc p.o. q.4 h. p.r.n. indigestion. 18.Ocuvite 1 tablet p.o. daily. 19.Polyethylene glycol 3350, 17 g p.o. daily. 20.Protein powder, one scoop in 8  ounces of liquid p.o. b.i.d. 21.Systane Ultra Lubricant Eye Drops, one drop in both eyes daily     p.r.n. 22.Triamcinolone acetonide 0.1% lotion/Cetaphil, one lotion to rash     topically b.i.d.  CONSULTATIONS:  Dr. Roger Shelter of Cardiology.  BRIEF ADMISSION HPI:  The patient is an 77 year old male who was brought to the hospital for evaluation due to complaints of palpitations and racing heart.  He was also short of breath, and upon initial evaluation in the emergency department, was noted to be in atrial fibrillation. The patient subsequently was referred to the hospitalist service for further inpatient evaluation and treatment.  For the full details, please see the dictated report done by Dr. Lovell Sheehan.  PROCEDURES AND DIAGNOSTIC STUDIES: 1. Chest x-ray on August 22, 2010, showed bibasilar atelectasis     reflecting the patient's very large hiatal hernia.  Lungs are     otherwise grossly clear.  Cardiomegaly. 2. CT angiogram of the chest on August 22, 2010, showed no evidence of     pulmonary embolism.  Similar appearance of large hiatal hernia     containing the majority of the stomach and a large portion of the     transverse colon.  Gastroesophageal reflux versus esophageal     dysmotility.  Indeterminate liver lesion, for which malignancy is     not excluded.  Outpatient nonemergent contrast-enhanced abdominal     MRI should be considered. 3. Two-dimensional echocardiogram on August 22, 2010, showed normal     systolic function with an estimated ejection fraction of 55% to     60%.  No regional wall motion abnormalities.  Mild concentric     hypertrophy. 4. Chest x-ray on August 24, 2010, showed stable large hiatal hernia     with mild cardiomegaly and no active lung disease.  DISCHARGE LABORATORY VALUES:  Sodium was 132, potassium 4.6, chloride 96, bicarb 28, BUN 10, creatinine 0.70, glucose 96, calcium 8.9.  PT was 18 with an INR of 1.47.  BNP was 215.  Cardiac markers were  negative x3 sets.  Cholesterol was 122, triglycerides 39, HDL 56, and LDL 58.  TSH was 1.179.  HOSPITAL COURSE BY PROBLEM: 1. Atrial fibrillation with rapid ventricular response:  This is most     likely triggered by his acute bronchitis and is new in onset.  The     only risk factor for thromboembolic disease is his age.     Nevertheless, the patient was put on therapeutic anticoagulation     with heparin and Cardiology consultation was requested.  Dr.     Dietrich Pates initially saw the patient in consultation and recommended     Coumadin versus aspirin long term and recommended a two-dimensional     echocardiogram.  Because of findings on the two-dimensional     echocardiogram, Cardiology did recommend that the patient be     anticoagulated due to an increased risk in thromboembolic disease     and the patient was put on both heparin and Coumadin, which is     managed by Pharmacy.  The patient's INR is not yet currently     therapeutic and he will be discharged home on a combination of     subcutaneous Lovenox and Coumadin until his INR has been     therapeutic x48 hours.  He was monitored closely while initiating     therapeutic anticoagulation, without any adverse effects.  His rate     was mostly controlled through the hospital stay, but he did have     periods of rapid ventricular response and, accordingly, we have     gone up on his atenolol to help reduce these occurrences. 2. Acute bronchitis:  The patient did develop bronchitis-type symptoms     with cough and coarse breath sounds.  He was put initially on both     Rocephin and azithromycin.  At this point, his antibiotic coverage     has been narrowed to azithromycin monotherapy, as repeat chest     radiographs have not shown any evidence of underlying pneumonia. 3. Chronic mild hyponatremia:  The patient's hyponatremia is stable.     He does have a dilute urine suggestive that this is probably from     SIADH. 4. Charcot joint:   The patient does have bilateral foot wounds, for     which the wound care nurse has seen and evaluated him.     Recommendations were to apply Adaptic, contact layer with Vaseline     gauze and Kerlix, covered by Coban dressings, and to be changed     three times per week.  This wound care should continue post     discharge. 5. Hypertension:  The patient  does have some elevated blood pressures,     for which we have gone up on his atenolol.  He will need close     followup by his outpatient treating physicians. 6. Benign prostatic hypertrophy:  The patient was maintained on     Avodart and had no problems with voiding while in the hospital. 7. Hiatal hernia:  The patient is stable with regard to his hiatal     hernia.  He should follow up with his primary care physician for     further treatment recommendations as needed. 8. Chronic normocytic anemia:  The patient is on iron and his     hemoglobin/hematocrit have been stable.  If he has not had a GI     evaluation, recommend outpatient GI evaluation. 9. Anxiety:  The patient did confide that he has problems with     intermittent depression and feeling anxious.  The patient was put     on low-dose Xanax to see if this helps alleviate his symptoms. 10.Indeterminate liver lesion:  The patient does have an indeterminate     liver lesion noted on CT angiography.  Recommendations are for an     outpatient nonemergent contrast-enhanced abdominal MRI to rule out     underlying malignancy.  DISPOSITION:  The patient is medically stable and will be discharged back to his assisted living facility.  Time spent on discharging the patient including face-to-face time equals 40 minutes.  CONDITION ON DISCHARGE:  Improved.     Hillery Aldo, M.D.     CR/MEDQ  D:  08/26/2010  T:  08/26/2010  Job:  [id number]  cc:   Dr. Neita Garnet. Deborah Chalk, M.D. Fax: [phone]  Electronically Signed by Hillery Aldo M.D. on 08/28/2010 07:14:54  AM

## 2010-09-02 ENCOUNTER — Emergency Department (HOSPITAL_COMMUNITY)
Admission: EM | Admit: 2010-09-02 | Discharge: 2010-09-02 | Disposition: A | Discharge status: Home / Self Care | Payer: Medicare Other | Attending: Emergency Medicine | Admitting: Emergency Medicine

## 2010-09-02 DIAGNOSIS — R5383 Other fatigue: Secondary | ICD-10-CM | POA: Insufficient documentation

## 2010-09-02 DIAGNOSIS — R5381 Other malaise: Secondary | ICD-10-CM | POA: Insufficient documentation

## 2010-09-02 DIAGNOSIS — I10 Essential (primary) hypertension: Secondary | ICD-10-CM | POA: Insufficient documentation

## 2010-09-02 DIAGNOSIS — I4891 Unspecified atrial fibrillation: Secondary | ICD-10-CM | POA: Insufficient documentation

## 2010-09-02 DIAGNOSIS — R42 Dizziness and giddiness: Secondary | ICD-10-CM | POA: Insufficient documentation

## 2010-09-02 DIAGNOSIS — E871 Hypo-osmolality and hyponatremia: Secondary | ICD-10-CM | POA: Insufficient documentation

## 2010-09-02 LAB — URINALYSIS, ROUTINE W REFLEX MICROSCOPIC
Bilirubin Urine: NEGATIVE
Glucose, UA: NEGATIVE mg/dL
Ketones, ur: NEGATIVE mg/dL
pH: 7 (ref 5.0–8.0)

## 2010-09-02 LAB — BASIC METABOLIC PANEL
BUN: 16 mg/dL (ref 6–23)
Chloride: 89 mEq/L — ABNORMAL LOW (ref 96–112)
GFR calc non Af Amer: >60 mL/min (ref >60)
Glucose, Bld: 100 mg/dL — ABNORMAL HIGH (ref 70–99)
Potassium: 4.2 mEq/L (ref 3.5–5.1)

## 2010-09-02 LAB — DIFFERENTIAL
Basophils Absolute: 0 10*3/uL (ref 0.0–0.1)
Eosinophils Absolute: 0.1 10*3/uL (ref 0.0–0.7)
Eosinophils Relative: 2 % (ref 0–5)
Lymphocytes Relative: 14 % (ref 12–46)

## 2010-09-02 LAB — POCT CARDIAC MARKERS
CKMB, poc: 2.8 ng/mL (ref 1.0–8.0)
Troponin i, poc: <0.05 ng/mL (ref 0.00–0.09)

## 2010-09-02 LAB — CBC
Platelets: 266 10*3/uL (ref 150–400)
RDW: 13 % (ref 11.5–15.5)
WBC: 9.5 10*3/uL (ref 4.0–10.5)

## 2010-09-02 LAB — PROTIME-INR
INR: 2.55 — ABNORMAL HIGH (ref 0.00–1.49)
Prothrombin Time: 27.5 seconds — ABNORMAL HIGH (ref 11.6–15.2)

## 2010-09-06 ENCOUNTER — Encounter (HOSPITAL_BASED_OUTPATIENT_CLINIC_OR_DEPARTMENT_OTHER): Payer: Medicare Other | Attending: Internal Medicine

## 2010-09-06 DIAGNOSIS — L97509 Non-pressure chronic ulcer of other part of unspecified foot with unspecified severity: Secondary | ICD-10-CM | POA: Insufficient documentation

## 2010-09-06 DIAGNOSIS — B965 Pseudomonas (aeruginosa) (mallei) (pseudomallei) as the cause of diseases classified elsewhere: Secondary | ICD-10-CM | POA: Insufficient documentation

## 2010-09-06 DIAGNOSIS — E1169 Type 2 diabetes mellitus with other specified complication: Secondary | ICD-10-CM | POA: Insufficient documentation

## 2010-09-06 DIAGNOSIS — L97409 Non-pressure chronic ulcer of unspecified heel and midfoot with unspecified severity: Secondary | ICD-10-CM | POA: Insufficient documentation
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
## 2010-09-20 ENCOUNTER — Encounter (HOSPITAL_BASED_OUTPATIENT_CLINIC_OR_DEPARTMENT_OTHER): Payer: Medicare Other | Attending: Internal Medicine

## 2010-09-20 DIAGNOSIS — R609 Edema, unspecified: Secondary | ICD-10-CM | POA: Insufficient documentation

## 2010-09-20 DIAGNOSIS — L97509 Non-pressure chronic ulcer of other part of unspecified foot with unspecified severity: Secondary | ICD-10-CM | POA: Insufficient documentation

## 2010-09-25 ENCOUNTER — Emergency Department (HOSPITAL_COMMUNITY)
Admission: EM | Admit: 2010-09-25 | Discharge: 2010-09-26 | Disposition: A | Discharge status: Home / Self Care | Payer: Medicare Other | Attending: Emergency Medicine | Admitting: Emergency Medicine

## 2010-09-25 DIAGNOSIS — R109 Unspecified abdominal pain: Secondary | ICD-10-CM | POA: Insufficient documentation

## 2010-09-25 DIAGNOSIS — I4891 Unspecified atrial fibrillation: Secondary | ICD-10-CM | POA: Insufficient documentation

## 2010-09-25 DIAGNOSIS — R0602 Shortness of breath: Secondary | ICD-10-CM | POA: Insufficient documentation

## 2010-09-25 DIAGNOSIS — K449 Diaphragmatic hernia without obstruction or gangrene: Secondary | ICD-10-CM | POA: Insufficient documentation

## 2010-09-25 DIAGNOSIS — Z7901 Long term (current) use of anticoagulants: Secondary | ICD-10-CM | POA: Insufficient documentation

## 2010-09-25 DIAGNOSIS — K4090 Unilateral inguinal hernia, without obstruction or gangrene, not specified as recurrent: Secondary | ICD-10-CM | POA: Insufficient documentation

## 2010-09-25 NOTE — H&P (Signed)
NAME:  GIOVAN, PINSKY NO.:  [id number]  MEDICAL RECORD NO.:  [id number]           PATIENT TYPE:  E  LOCATION:  WLED                         FACILITY:  [facility]  PHYSICIAN:  Della Goo, M.D. DATE OF BIRTH:  17-Feb-1927  DATE OF ADMISSION:  08/22/2010 DATE OF DISCHARGE:                             HISTORY & PHYSICAL   DATE OF ADMISSION:  August 22, 2010.  PRIMARY CARE PHYSICIAN:  Drue Dun. Tripp, M.D.  CHIEF COMPLAINT:  Palpitations.  HISTORY OF PRESENT ILLNESS:  This is an 77 year old male who resides at the [facility], who was brought to the emergency department secondary to complaints of palpitations, a feeling that his heart was racing.  The patient also had shortness of breath. He denies having any chest pain.  The patient denies having any fevers, chills, nausea, vomiting or diarrhea.  The patient thought his shortness of breath may have been due to the pollen in the air.  The patient was seen and evaluated in the emergency department.  He was found to be in atrial fibrillation.  The initial EKGs had rates of 60 and 80; however, he was observed and his heart rate did on occasion go up to the 130s.  The patient was placed on an IV Cardizem drip and referred for medical admission.  The patient denies having any nausea, vomiting, diarrhea.  PAST MEDICAL HISTORY:  Significant for: 1. Hypertension. 2. Venous stasis ulcers, bilateral lower extremities and feet. 3. Charcot deformity in both feet with neuropathy. 4. Hiatal hernia and esophageal ring. 5. Benign prostatic hypertrophy. 6. The patient has anemia as well.  PAST SURGICAL HISTORY:  None.  MEDICATIONS:  At this time include aspirin, atenolol, Avodart, Colace, ferrous sulfate, hydrochlorothiazide, losartan, MiraLax and Mylanta.  ALLERGIES:  ADHESIVE TAPE.  SOCIAL HISTORY:  The patient resides at the [facility].  He is a nonsmoker.   He reports occasional alcohol usage.  He denies any illicit drug usage.  FAMILY HISTORY:  Positive for hypertension in both parents.  No coronary artery disease, diabetes or cancer in his family that he knows of.  REVIEW OF SYSTEMS:  Pertinents are mentioned above.  PHYSICAL EXAMINATION FINDINGS:  GENERAL:  This is a thin elderly 77 year old Caucasian male who is in no acute distress. VITAL SIGNS:  Initially, temperature 98.4, blood pressure 131/90, heart rate 91 but ranging to 130, and respirations 18.  O2 saturations 94-96%. HEENT:  Normocephalic, atraumatic.  Pupils equally round and reactive to light.  Extraocular movements are intact.  Funduscopic benign.  Nares are patent bilaterally.  Oropharynx is clear.  The patient is edentulous with dentures on both levels. NECK:  Supple.  Full range of motion.  No thyromegaly, adenopathy, jugular venous distention. CARDIOVASCULAR:  Irregularly irregular rhythm.  No murmurs, gallops or rubs appreciated. LUNGS:  Clear to auscultation bilaterally.  No rales, rhonchi or wheezes.ABDOMEN:  Positive bowel sounds.  Soft, nontender, nondistended.  No hepatosplenomegaly or edema. EXTREMITIES:  The lower extremities are wrapped in compression bandages secondary to his venous stasis ulcers and Charcot foot deformities. NEUROLOGIC:  The patient is  alert and oriented x3.  He is able to move all 4 of his extremities but has generalized weakness.  LABORATORY STUDIES:  White blood cell count 8.8, hemoglobin 11.0, hematocrit 33.3, MCV 91.7, platelets 209, neutrophils 73%, lymphocytes 18%.  Sodium 132, potassium 4.0, chloride 97, CO2 26, BUN 21, creatinine 1.0 and glucose 102.  Ionized calcium 1.14.  Point-of-care cardiac markers with a myoglobin of 127, CK-MB 2.3 and troponin less than 0.05. EKG reveals an atrial fibrillation at a rate of 83 and an incomplete right bundle-branch block is seen and nonspecific ST-segment changes are also  seen.  ASSESSMENT:  An 77 year old male being admitted with: 1. New atrial fibrillation with rapid ventricular response. 2. Palpitations secondary to new atrial fibrillation with rapid     ventricular response. 3. Hypertension. 4. Bilateral lower extremity venous stasis ulcers. 5. Charcot foot deformities of both feet.  PLAN:  The patient will be admitted to the ICU and continued on the IV Cardizem drip.  The patient will also be placed on an IV heparin drip and Coumadin protocol for now.  Cardiac enzymes will also be performed, and a 2-D echo study will be ordered.  The patient's regular medications will be further reconciled and further workup will ensue pending results of the patient's clinical course and his studies.  The patient is a full code at this time.     Della Goo, M.D.     HJ/MEDQ  D:  08/22/2010  T:  08/22/2010  Job:  [id number]  Electronically Signed by Della Goo M.D. on 09/25/2010 07:45:04 PM

## 2010-09-26 ENCOUNTER — Emergency Department (HOSPITAL_COMMUNITY): Payer: Medicare Other

## 2010-09-26 LAB — LIPASE, BLOOD: Lipase: 34 U/L (ref 11–59)

## 2010-09-26 LAB — DIFFERENTIAL
Basophils Absolute: 0 10*3/uL (ref 0.0–0.1)
Basophils Relative: 0 % (ref 0–1)
Neutro Abs: 7 10*3/uL (ref 1.7–7.7)
Neutrophils Relative %: 75 % (ref 43–77)

## 2010-09-26 LAB — URINALYSIS, ROUTINE W REFLEX MICROSCOPIC
Nitrite: NEGATIVE
Specific Gravity, Urine: 1.01 (ref 1.005–1.030)
Urobilinogen, UA: 0.2 mg/dL (ref 0.0–1.0)

## 2010-09-26 LAB — COMPREHENSIVE METABOLIC PANEL
ALT: 41 U/L (ref 0–53)
Calcium: 8.9 mg/dL (ref 8.4–10.5)
Creatinine, Ser: 0.7 mg/dL (ref 0.4–1.5)
Glucose, Bld: 112 mg/dL — ABNORMAL HIGH (ref 70–99)
Sodium: 134 mEq/L — ABNORMAL LOW (ref 135–145)
Total Protein: 6.7 g/dL (ref 6.0–8.3)

## 2010-09-26 LAB — PROTIME-INR
INR: 2.09 — ABNORMAL HIGH (ref 0.00–1.49)
Prothrombin Time: 23.6 seconds — ABNORMAL HIGH (ref 11.6–15.2)

## 2010-09-26 LAB — CBC
Hemoglobin: 11.7 g/dL — ABNORMAL LOW (ref 13.0–17.0)
MCHC: 32.4 g/dL (ref 30.0–36.0)
Platelets: 220 10*3/uL (ref 150–400)
RBC: 3.94 MIL/uL — ABNORMAL LOW (ref 4.22–5.81)

## 2010-09-26 MED ORDER — IOHEXOL 300 MG/ML  SOLN
125.0000 mL | Freq: Once | INTRAMUSCULAR | Status: AC | PRN
  Administered 2010-09-26: 125 mL via INTRAVENOUS

## 2010-09-27 LAB — URINE CULTURE
Colony Count: 40000
Culture  Setup Time: 201205131114

## 2010-09-28 NOTE — Assessment & Plan Note (Signed)
[facility]   NAME:  SEDERICK, JACOBSEN NO.:  [id number]   MEDICAL RECORD NO.:  [id number]      DATE OF BIRTH:  18-Nov-1926   PHYSICIAN:  Maxwell Caul, M.D. VISIT DATE:  12/08/2006                                   OFFICE VISIT   [name] returns today for follow-up of his severe neuropathic ulcers  in both feet.  He finally has been able to get a hold of his custom-made  foot wear and inserts.  He also has his own graded pressure stockings.  He returns today in follow-up having no excessive drainage, malodor or  pain.   EXAMINATION:  Temperature is 98, pulse 88, respirations 22, blood  pressure 151/68.  He continues to have severe venous stasis changes in  his legs, however there are no ulcerations.  On the right foot there is  a linear ulceration in the middle of the Charcot deformity, however this  is  very small and thin, and the base of this appears clean.  There is  no associated wound on the left foot.   IMPRESSION:  Stable and improved neuropathic ulcers for both feet with  severe Charcot-like deformities.   He has received his shoes and his graded pressure stockings.  He is well  aware of his feet and I will give him one more appointment in 2 weeks  for [place] to review things.  After that hopefully if things are no worse he  can be discharged.           ______________________________  Maxwell Caul, M.D.     MGR/MEDQ  D:  12/08/2006  T:  12/09/2006  Job:  [id number]

## 2010-09-28 NOTE — Assessment & Plan Note (Signed)
[facility]   NAME:  [name], [name]               ACCOUNT NO.:  [id number]   MEDICAL RECORD NO.:  [id number]      DATE OF BIRTH:  Dec 09, 1926   PHYSICIAN:  Jake Shark A. Tanda Rockers, M.D. VISIT DATE:  12/01/2006                                   OFFICE VISIT   SUBJECTIVE:  [name] is a 77 year old man who we have followed for  neuropathic ulcers of both feet.  Over the past several months, we have  been assisting him with his attempts to procure custom footwear.  In the  interim, we have placed him in offloading healing sandals utilizing  modifications of felt padding.  He returns for followup.  There has been  no excessive drainage, malodor, pain or fever.   OBJECTIVE:  Blood pressure is 114/70, respirations 18, pulse rate 67,  temperature 98.6.  Inspection of the lower extremity shows the chronic  changes are persistent.  There are no ulcerations in the left foot.  On  the right foot, there is a linear superficial ulceration which is clean  with no evidence of active infection or ascending cellulitis.  There is  associated 1 to 2+ edema with chronic changes of stasis.  The pedal  pulse remains palpable.   ASSESSMENT:  Stable and  improved neuropathic ulcers, right lower foot.   PLAN:  During the clinic, we contacted [employer], his orthotics Saraih [employer]orton.  We have strongly encouraged the patient to make a visit to the company  to receive his shoes.  His wound is at the point now where he is ready  to go into his custom shoes.  We will reevaluate him in 2 weeks p.r.n.      [name], M.D.  Electronically Signed     HAN/MEDQ  D:  12/01/2006  T:  12/02/2006  Job:  [id number]

## 2010-09-28 NOTE — Assessment & Plan Note (Signed)
[facility]   NAME:  [name], [name]               ACCOUNT NO.:  [id number]   MEDICAL RECORD NO.:  [id number]      DATE OF BIRTH:  04/16/1927   PHYSICIAN:  Jake Shark A. Tanda Rockers, M.D. VISIT DATE:  01/02/2007                                   OFFICE VISIT   SUBJECTIVE:  [name] is a 77 year old man who is well-known in the  Wound Center.  We have followed him for neuropathic ulcers involving  both lower extremities.  He procured custom shoes and inserts  approximately a month ago.  He was most recently seen in the clinic on  August 8, at which time he had a blister on the medial aspect of the  left lower extremity.  He was advised to be reevaluated by the orthotist  to have adjustment of his foot wear.  He returns for interim evaluation.  He has not been seen by the orthotist.  He has complained of the blister  enlarging, but there has been no excessive drainage, malodor or fever.   OBJECTIVE:  Inspection of the lower extremity shows there are persistent  changes of Charcot arthropathy.  There is no evidence of ascending  cellulitis, lymphangitis or abscess.  The previously numbered and  cataloged wounds were 12 and 13.  These wounds are on the medial aspect  with overriding of the medial malleolus and the mid foot bones.  The  previously described blister has ruptured and drained, there is no  evidence of active infection.  The residual blister shell was shortly  excised with minimum hemorrhage.  The previous ulcers numbers 12 and 13  are completely re-epithelialized but the tissue is quite thin and  friable.  Benzoin was used to place about the wound and a silver  colloidal dressing was applied and adhered with Benzoin.  Wound #14 was  cleansed and a Band-Aid was applied.   ASSESSMENT:  Neuropathic ulcer, left foot, clinically improving.   PLAN:  Again we have suggested that the patient make his appointment  with the orthotist for readjustment of his footwear as  to offload the  offending pressure point from his new shoes.   We will reevaluate the patient in 1 week.  We have assigned him for  follow-up in the nurse only clinic for the next 4 weekly visits.  He  will be reevaluated by the physician in 1 month.  We have explained this  change in follow-up management to the patient in terms that he seems to  understand.  He has been clearly advised that he can request that the  physician evaluate him during his follow-up visits if he is  uncomfortable.  He expresses gratitude for having been seen in the  clinic and indicates that he will be compliant, particularly with  attending the orthotist for adjustment of his footwear.      [name], M.D.  Electronically Signed     HAN/MEDQ  D:  01/02/2007  T:  01/03/2007  Job:  [id number]

## 2010-09-28 NOTE — H&P (Signed)
NAME:  Daniel, [name] NO.:  [id number]   MEDICAL RECORD NO.:  [id number]          PATIENT TYPE:  INP   LOCATION:  0106                         FACILITY:  [facility]   PHYSICIAN:  Lonia Blood, M.D.DATE OF BIRTH:  Oct 09, 1926   DATE OF ADMISSION:  01/17/2007  DATE OF DISCHARGE:                              HISTORY & PHYSICAL   PRIMARY CARE PHYSICIAN:  [facility]/unassigned.   CHIEF COMPLAINT:  Left foot wound with cellulitis.   HISTORY OF PRESENT ILLNESS:  [name] is a very pleasant 77-  year-old gentleman who lives in the [place] area.  He has a long  history of malformed ankle joints of bilateral feet resulting in  malformation of the feet as well.  He was a Hydrographic surveyor  for many years and also suffered a traumatic injury to one of his ankles  and this resulted in the malformation of his feet.  As a result he has  suffered with chronic wounds to the lateral aspects of both feet.  He is  followed routinely in the Wound Care Center.  He has had a wound on his  left foot for some months now.  It was initially improving nicely.  He  presented for evaluation at the [facility] Care Center today, however, and  was found to have read streaks running up the anterior aspect of the  left leg with purulent drainage from the wound on the plantar aspect of  the left foot in the lateral region.  After further evaluation it was  felt that this likely represented a deep abscess and that inpatient  hospitalization with IV antibiotics would be most appropriate.  The  patient was sent to the emergency room for evaluation.  At that time, I  was called to evaluate the patient and determine his ultimate  disposition.   At the time of my evaluation the patient was resting comfortably on the  hospital stretcher in the [facility] emergency room.  He has no systemic complaints.  He states that he has very little pain  in his feet because he  has very little sensation there.  There has been  no nausea, vomiting, fever, nightsweats, chills.  His appetite has been  good.   REVIEW OF SYSTEMS:  Comprehensive review of systems is unremarkable with  exception of positive elements noted in the history of present illness  above.   PAST MEDICAL HISTORY:  1. Chronic neuropathic ulcers of bilateral feet secondary to multiple      years of ballet dancing as well as traumatic injuries.  2. Known large hiatal hernia by EGD.  3. Normal colonoscopy by Bernette Redbird, M.D. in June of 2001.  4. History of traumatic fracture of the right ankle in 1943.  5. Hypertension.  6. BPH.   OUTPATIENT MEDICATIONS:  1. Avodart 0.5 mg p.o. daily.  2. Hyzaar 100/25 mg p.o. daily.   ALLERGIES:  No known drug allergies.   FAMILY HISTORY:  Noncontributory secondary to age.   SOCIAL HISTORY:  The patient lives in [place].  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  He also works  regularly as an extra in movies.  He does not smoke.  He occasionally  partakes of alcohol, but not to excess.   DATA REVIEW:  Hemoglobin is low at 11.1 with a normal MCV and normal  white count and platelet count.  CMET is remarkable for a low potassium  at 3.2, low albumin at 3.1, but is otherwise unremarkable.   Left foot x-ray reveals probable abscess of the medial aspect of the  left foot with severe disorganized joints of the mid foot and heel with  no clinical decision able to be made as to the probability of  osteomyelitis on the plain film view.   PHYSICAL EXAMINATION:  VITAL SIGNS:  Temperature 98.1, blood pressure  138/97, heart rate 84, respiratory rate 20, O2 saturations 99% on room  air.  GENERAL:  Well-developed, well-nourished male in no acute respiratory  distress.  NECK:  No JVD, no lymphadenopathy.  LUNGS:  Clear to auscultation bilaterally without wheezes or rhonchi.  CARDIOVASCULAR:  Regular rate and rhythm  without murmurs, rubs, or  gallops.  Normal S1 and S2.  ABDOMEN:  Nontender, nondistended, soft, bowel sounds present.  No  hepatosplenomegaly.  No rebound and no ascites.  EXTREMITIES:  No significant cyanosis or clubbing bilateral lower  extremities.  The patient has severe deformity of both ankles and feet.  There is significant erythema of the left foot with a dime-sized ulcer  at the medial aspect on the plantar area of the foot.  There is an  iodoform wick placed in one of these wounds.  Erythema spreads across  onto the dorsum of the foot and up the anterior aspect of the leg.  There is no fluctuance appreciable and no crepitus on palpation of the  skin itself.  NEUROLOGY:  Alert and oriented x4.  Cranial nerves II-XII grossly intact  bilaterally.  5/5 strength bilateral upper and lower extremities.   IMPRESSION:  1. Severe left foot cellulitis with possible osteomyelitis.  At the      present time it is not clear if the patient is suffering from      cellulitis or not.  We will order an MRI to evaluate the patient's      foot to better investigate the possibility of osteomyelitis.  If      osteomyelitis is present, surgical debridement may be necessary.      For now, I will treat the patient empirically with Rocephin at 1      gram IV q.24 hours.  We will obtain wound cultures to help focus      our antibiotic therapy.  We will await the results of his MRI      before proceeding further.  Given the fact that there is increased      edema in the leg, I will also check a left lower extremity Doppler      to rule out DVT.  The patient's edema, however, could very easily      be explained with his cellulitis alone.  2. Hypertension.  The patient's blood pressure is reasonably      controlled at the present time.  I will continue his Hyzaar and      follow his blood pressure closely.  3. Normocytic anemia.  The patient has a normocytic anemia that is      likely secondary to  chronic inflammatory state with his chronic  wound infections of both feet.  I will check an anemia panel as      well as stool guaiacs for completeness sake.  It is notable that      the patient had a normal colonoscopy in June of 2001.  4. Hypokalemia.  The patient is suffering with a mild hypokalemia.  I      will replete him with p.o. potassium as well as IV.  We will check      a magnesium level.  This is likely secondary to intermittent Lasix      therapy for lower extremity edema.      Lonia Blood, M.D.  Electronically Signed     JTM/MEDQ  D:  01/17/2007  T:  01/17/2007  Job:  [id number]   cc:   Karene Fry Day, MD  Fax: [phone]

## 2010-09-28 NOTE — Consult Note (Signed)
NAME:  MOXON, MESSLER NO.:  [id number]   MEDICAL RECORD NO.:  [id number]         PATIENT TYPE:  LINP   LOCATION:                               FACILITY:  [facility]   PHYSICIAN:  Myrtie Neither, MD      DATE OF BIRTH:  1927/01/09   DATE OF CONSULTATION:  01/20/2007  DATE OF DISCHARGE:                                 CONSULTATION   REASON FOR CONSULTATION:  Cellulitis, abscess left foot.   PERTINENT HISTORY:  This is a 77 year old gentleman who has a long  history of bilateral foot and ankle deformities with valgus deformities.  Patient has a history of being a Forensic psychologist.  Patient states  recently he had been followed by wound care center because of small  ulcerations about the left foot that had developed pain, swelling,  fever, drainage on the left foot after wearing some new shoes.  Patient  felt that the shoes had rubbed his foot.  Patient was admitted for the  above problems.   PAST MEDICAL HISTORY:  1. Chronic neuropathy with ulcerations bilateral feet.  2. History of large hiatal hernia by EGD.  3. History of a right ankle fractures.  4. Hypertension.  5. Benign prostatic hypertrophy.   MEDICATIONS:  1. Avodart 0.5 mg daily.  2. Hyzaar 100/25 milligrams daily.   ALLERGIES:  Not known.   FAMILY HISTORY:  Noncontributory.   SOCIAL HISTORY:  Negative.   PERTINENT PHYSICAL:  Left foot.  Large soft tissue mass draining  purulent material, 3 wounds.  One __________  without a gauze.  Erythema.  Pes planus valgus deformity with hallux valgus bunion  deformities of the toes.  Dry, peely skin with thick callosity medial  aspect of the entire foot.  X-rays reveal soft-tissue swelling.  No  evidence of osteomyelitis.  MRI demonstrates abscess.  No evidence of  osteomyelitis.  Patient's white cell count was normal.  Hemoglobin and  hematocrit 11.1 and 32.   IMPRESSION:  Cellulitis.  Abscess left foot.  Rule out osteomyelitis.  Recommend sed rate,  C-reactive protein, wound care with contact  precautions, wet-to-dry dressings to the left foot.  Will most probably  need incision, debridement and drainage of the left foot.  Will follow.      Myrtie Neither, MD  Electronically Signed     AC/MEDQ  D:  01/20/2007  T:  01/20/2007  Job:  [id number]

## 2010-09-28 NOTE — Op Note (Signed)
NAME:  ERIQUE, KASER NO.:  [id number]   MEDICAL RECORD NO.:  [id number]          PATIENT TYPE:  INP   LOCATION:  1538                         FACILITY:  [facility]   PHYSICIAN:  Myrtie Neither, MD      DATE OF BIRTH:  1927/02/22   DATE OF PROCEDURE:  01/22/2007  DATE OF DISCHARGE:                               OPERATIVE REPORT   PREOPERATIVE DIAGNOSIS:  Open wound, left foot.   POSTOPERATIVE DIAGNOSIS:  Open wound, left foot.   ANESTHESIA:  General.   PROCEDURE:  Irrigation and debridement open wound, left foot with  delayed wound closure.   DESCRIPTION OF PROCEDURE:  The patient was taken to the operating room.  After given adequate preoperative medication, given general anesthesia  and intubated, the left foot was scrubbed with Betadine scrub and  painted with Betadine solution, draped in a sterile manner.  Wound  itself was on the medial aspect of the mid foot, extending 5-1/2 inches.  The bed of the wound was pink and gray material.  Some local debridement  was done about the skin edges.  Undermining of the soft tissue was done  to allow closure.  A 0 Vicryl was used for the subcutaneous.  The wound  itself, after irrigation and debridement, was also irrigated with  antibiotic solution.  After adequate debridement, 0 Vicryl was used for  the subcutaneous. A #1 nylon was used in a self-retaining suture to  approximate the skin.  Bulky compressive dressing was applied.  The  patient tolerated the procedure quite well.  Minimal blood loss.  The  patient tolerated the procedure quite well and went to the recovery room  in stable and satisfactory condition.      Myrtie Neither, MD  Electronically Signed     AC/MEDQ  D:  01/22/2007  T:  01/23/2007  Job:  [id number]

## 2010-09-28 NOTE — Consult Note (Signed)
NAME:  [name], [name]               ACCOUNT NO.:  [id number]   MEDICAL RECORD NO.:  [id number]          PATIENT TYPE:  REC   LOCATION:  FOOT                         FACILITY:  [facility]   PHYSICIAN:  Jonelle Sports. Sevier, M.D. DATE OF BIRTH:  August 16, 1926   DATE OF CONSULTATION:  12/22/2006  DATE OF DISCHARGE:                                 CONSULTATION   HISTORY:  This 77 year old white male is well known to this clinic with  foot deformities characterized by medial overriding at the tarsal area  of the foot bilaterally with resulting callus and ulcerations in the  created pressure areas.   Until recently, all ulcerations had been healed, and he had been able to  go into special footwear but in an effort to control the weightbearing,  these have begun rubbing and areas of the medial deformities, and this  has resulted on the left in again a very superficial ulceration.   PHYSICAL EXAMINATION:  VITAL SIGNS:  Blood pressure 129/62, pulse 71,  respirations 16, temperature 98.2.   There is callus formation with slight hemorrhagic change on the plantar  aspect of the right foot at the area of the medial overriding and a one  at the linear crease in the center of this area.   On the left in the area of medial overriding, there is a small stage II  rubbed ulcer measuring 0.6 x 0.6 x 1.1 cm with no evidence of infection  or other complication.  There is also some persistent hemorrhagic callus  in that general area.   IMPRESSION:  Recurrent ulceration, left mid hind foot.   DISPOSITION:  1. The callus in both of these stressed areas is sharply debrided away      without incident.  The ulcer itself on the left foot does not      require immediate debridement.  2. The feet are treated with application of self-adherent Allevyn pads      to both these areas.  3. The patient is directed to see this afternoon the orthotist for      possible modifications that will prevent recurrence of these  events.   Followup here will be at a 2-week interval.           ______________________________  Jonelle Sports. Cheryll Cockayne, M.D.     RES/MEDQ  D:  12/22/2006  T:  12/22/2006  Job:  [id number]

## 2010-09-28 NOTE — Discharge Summary (Signed)
NAME:  [name], [name]               ACCOUNT NO.:  [id number]   MEDICAL RECORD NO.:  [id number]          PATIENT TYPE:  INP   LOCATION:  1538                         FACILITY:  [facility]   PHYSICIAN:  Mobolaji B. Bakare, M.D.DATE OF BIRTH:  1926-08-05   DATE OF ADMISSION:  01/17/2007  DATE OF DISCHARGE:  01/25/2007                               DISCHARGE SUMMARY   ADDENDUM:  This is an addendum to an earlier dictation done by Dr.  Eda Paschal.   PRIMARY CARE PHYSICIAN:  [facility].   DISCHARGE MEDICATIONS:  1. Doxycycline 100 mg p.o. b.i.d.  2. Norvasc 10 mg p.o. daily.  3. Aspirin 81 mg p.o. daily.  4. Avodart 0.5 mg p.o. daily.  5. Hyzaar 100/25 mg p.o. daily.  6. Tylenol 650 mg p.o. q.4h. p.r.n.   HOSPITAL COURSE:  Sequel to the previous discharge summary done on  January 23, 2007.   Problem 1.  DIARRHEA:  The patient's stool was sent for C. difficile,  which turned out to be negative.  Ova and parasites was unremarkable  except for abundant yeast.  Stool culture showed no growth.  The patient  has been on laxatives since admission.  This was discontinued.  In  addition, the patient was on Zosyn, which might have caused antibiotic-  associated diarrhea.  This again was discontinued.  The patient's  diarrhea lasted 24 hours.  It has resolved.  He is able to tolerate his  diet.   Problem 2.  METHICILLIN-RESISTANT STAPHYLOCOCCUS AUREUS ABSCESS, LEFT  FOOT:  Wound culture grew MRSA sensitive to Bactrim, tetracycline and  Levaquin.  The patient has been on IV vancomycin for 5 days and started  with a transition to p.o. doxycycline.  He should continue with daily  wound dry dressing at the nursing home.   Problem 3.  HYPOKALEMIA:  This was repleted.  The patient had  hypokalemia secondary to diarrhea.  Potassium at the time of discharge  was 4.1.   Problem 4.  HYPERTENSION:  This is well-controlled with the addition of  Norvasc to his home medication of Hyzaar.   CONDITION AT  DISCHARGE:  The patient is stable clinically, afebrile at  the time of discharge.  He needs to continue physical and occupational  therapy at the skilled nursing facility as recommended by therapists  here in the hospital.   DISCHARGE LABORATORY DATA:  Sodium 136, potassium 4.4, chloride 103,  bicarb 27, BUN 4, creatinine 0.86, glucose 118, calcium 8.8.  White  cells 6.2, platelets 358, hemoglobin 10.9, MCV 90.   RECOMMENDATIONS:  1. Continue PT/OT at the skilled nursing facility.  2. Daily wound care with dry dressings.      Mobolaji B. Corky Downs, M.D.  Electronically Signed     MBB/MEDQ  D:  01/25/2007  T:  01/25/2007  Job:  [id number]

## 2010-09-28 NOTE — Discharge Summary (Signed)
NAME:  [name], [name]               ACCOUNT NO.:  [id number]   MEDICAL RECORD NO.:  [id number]          PATIENT TYPE:  INP   LOCATION:  1538                         FACILITY:  [facility]   PHYSICIAN:  Hind I Elsaid, MD      DATE OF BIRTH:  27-Mar-1927   DATE OF ADMISSION:  01/17/2007  DATE OF DISCHARGE:                               DISCHARGE SUMMARY   DISCHARGE DIAGNOSES:  1. Left foot and ankle cellulitis and abscess, status post incision      and drainage.  2. Chronic neuropathic ulcers bilateral feet.  3. Congenital abnormalities of the feet.  4. Pes planovalgus deformity with hallux valgus bony deformities of      the toes.  5. Hypertension.  6. Diarrhea, ruling out Clostridium difficile colitis.   DISCHARGE MEDICATIONS:  Will be addressed on the day of discharge.   CONSULTATIONS:  Myrtie Neither, MD, was consulted for evaluation of  cellulitis and abscess of the left foot with possible osteomyelitis.   PROCEDURES:  1. X-ray of the left foot:  Extensive soft tissue swelling of the      medial left foot with soft tissue worrisome for cellulitis and      abscess.  Osteomyelitis difficult to exclude depending on the      clinical concern.  Three-phase nuclear medicine bone scan or MRI      would be useful.  Disorganized joint of the foot with some      localization of the calcaneonavicular joint and flat foot deformity      raising the question of Charcot joint  2. MRI of the left foot:  Severe subcondylar foot deformity with      subcondylar cystic change; however, I do not see any evidence of      osteomyelitis. Medial soft tissue ulcer with soft tissue abscess      probably some area of devascularization tissue.   HISTORY OF PRESENT ILLNESS:  A 77 year old man who lives in [place],  long history of malformed ankle joint of bilateral feet resulting in  malformation of the feet as well.  He had chronic wound to the ankle,  and this resulted in a period.  He is followed  routinely in the wound  care center.  He had a wound on his foot for some months now.  He  presented for evaluation at the wound care center; however, found to  have red streak running up anterior aspect of the left leg with drainage  from the wound on the plantar aspect of the left foot in the lateral  region.  After further evaluation, it was felt that this likely  represented a deep abscess and inpatient hospitalization for antibiotics  would be most appropriate.  The patient was sent to the ED for  evaluation.  Left foot abscess and cellulitis, rule out osteomyelitis.  The patient was admitted to the hospital and started on Zosyn.  X-rays  show possibility of an abscess, cannot rule out osteomyelitis.  MRI was  done, results as above, evidence of abscess, but there is no  osteomyelitis.  Dr. Montez Morita was  consulted from orthopedics for  possibility of incision and drainage.  Dr. Montez Morita  responded to the  consultation where he recommended incision and drainage of the wound.  Incision and drainage of the wound was done twice during  hospitalization.  Wound culture grew out methicillin-resistant  Staphylococcus aureus which is sensitive to Bactrim and doxycycline.  It  was thought that the left foot abscess was secondary to community-  acquired MRSA.  The patient may need to go with Bactrim double strength  for 3-4 weeks p.o.  The patient continued to have dressing changes by  Dr. Montez Morita on a daily basis.   #2.  DIARRHEA;  During hospitalization, the patient developed diarrhea  which could be secondary to Zosyn, and we cannot rule out C. difficile.  Zosyn was discontinued as growth sensitive to vancomycin. Will continue  with vancomycin.  C. difficile toxin was pending.  Treated supportively  until results of the C. difficile.   #3.  HYPERTENSION:  Continued adjustment for hypertension was done  during hospitalization.   #4.  CHRONIC NEUROPATHIC FOOT ULCER: The right foot seems okay,  and  there is no evidence of abscess formation.   DISPOSITION:  The patient decided he would like to go to assisted living  facility and SNF.  Social worker was contacted and is in the process of  finding suitable place for the patient.  Most probably the patient will  be able to discharge in 2-3 days after excluding C. difficile colitis.      [name], MD  Electronically Signed     HIE/MEDQ  D:  01/23/2007  T:  01/24/2007  Job:  [id number]

## 2010-09-28 NOTE — Op Note (Signed)
NAME:  [name], [name] NO.:  [id number]   MEDICAL RECORD NO.:  [id number]          PATIENT TYPE:  INP   LOCATION:  1538                         FACILITY:  [facility]   PHYSICIAN:  Myrtie Neither, MD      DATE OF BIRTH:  05-03-1927   DATE OF PROCEDURE:  01/20/2007  DATE OF DISCHARGE:                               OPERATIVE REPORT   PREOPERATIVE DIAGNOSIS:  Abscess and cellulitis, left foot.   POSTOPERATIVE DIAGNOSIS:  Abscess and cellulitis, left foot.   ANESTHESIA:  General.   PROCEDURE:  Incision, debridement, and irrigation abscess, left foot,  down to the plantar fascia, and wound packing.   The patient was taken to the operating room. After being given adequate  preop medications, given general anesthesia and intubated.  The left  foot with scrubbed with Betadine scrub and painted with Betadine  solution and draped in a sterile manner.  Bovie for hemostasis.  The  patient has a large 1 1/2 inch soft tissue mass at the medial aspect of  his left foot about the arch area with three draining holes.  Wide  opening of the wound of the area was done and complete debridement and  resection of the abscess was done. Copious irrigation with irrigator was  done after adequate debridement.  The wound was left open and packed  with 1/2 inch iodoform gauze, a compressive dressing was applied.  The  patient tolerated the procedure quite well and went to the recovery room  in stable and satisfactory condition.      Myrtie Neither, MD  Electronically Signed     AC/MEDQ  D:  01/20/2007  T:  01/20/2007  Job:  [id number]

## 2010-09-28 NOTE — Assessment & Plan Note (Signed)
[facility]   NAME:  [name], [name] NO.:  [id number]   MEDICAL RECORD NO.:  [id number]      DATE OF BIRTH:  21-Feb-1927   PHYSICIAN:  Maxwell Caul, M.D. VISIT DATE:  11/24/2006                                   OFFICE VISIT   HISTORY:  [name] is a gentleman whom we have been following here  chronically for severe neuropathic ulcers with severe Charcot  deformities of his feet.  Most recently he developed a recurrent  ulceration in the middle of the right Charcot deformity on the volar  aspect of his right foot.  He has been treated with modified healing  sandals with felt pressure relief.  He also has bilateral stasis edema.  Ultimately, the plan had been to procure diabetic foot wear and graded  pressure stockings.  For 1 reason or another we never seemed to have  gotten all of these things together at once.  He tells me that the  diabetic foot wear is in, but he still has some been able to get old the  company where he is to pick them up.   EXAMINATION:  His temperature is 98.3.  He is not a diabetic.  Actually his severely deformed feet look as good  as I have seen him in quite some time.  The 1-year ulcer in the middle  of the Charcot-like deformity in the right foot actually is healed over.  I see no open wounds here.   PLAN:  We have continued with our frequent follow-ups of this man  because in the absence of these he develops severe recurrent  ulcerations.  We have applied a he felt pressure relief and healing  sandal to the right leg with lamb's wool between the toes, TCA and a  Profore light to the right leg.  He has lamb's wool, TCA, Profore Light,  and a healing sandal on the left leg.  Hopefully at some point we can  like get all of these orthotics and graded pressure stockings together,  and give him a chance to see if he can manage home a protracted basis.  Right now his wounds are healed and he tells me his shoes  are in.  We  will see him as usual again in 1 week.           ______________________________  Maxwell Caul, M.D.     MGR/MEDQ  D:  11/24/2006  T:  11/25/2006  Job:  [id number]

## 2010-09-28 NOTE — Assessment & Plan Note (Signed)
[facility]   NAME:  [name], [name]               ACCOUNT NO.:  [id number]   MEDICAL RECORD NO.:  [id number]      DATE OF BIRTH:  10/10/1926   PHYSICIAN:  Jake Shark A. Tanda Rockers, M.D. VISIT DATE:  11/16/2006                                   OFFICE VISIT   SUBJECTIVE:  Ms. [name] is a 77 year old man with bilateral neuropathic  ulcers.  Over the course of several months he has resolved the ulcers on  his left foot.  He has one residual wound on the volar aspect of his  right foot. During the interim, we have treated him with off-loading,  modified healing sandal. He denies excessive drainage, malodor, pain or  fever.  He returns for follow-up.   OBJECTIVE:  Blood pressure is 127/72, respirations 18, pulse rate 72,  temperature is 98.  Inspection of the lower extremity shows a persistence of 2+ edema with  chronic changes of stasis. The skin is taut and shiny.  The pedal pulses  are readily palpable. The ulcer wound #4 on the volar aspect of the  right foot has a re growth of callus with minimum areas of necrosis  toward the central wound.  Sharp debridement was performed with  hemorrhage controlled with silver nitrate.  There is no evidence of  active infection.   ASSESSMENT:  Neuropathic ulcer.   PLAN:  We will return the patient to bilateral Prafo lights to provide  compression. We will add triamcinolone cream with continuation of the  modified healing sandals.  We will place lamb's wool between the toes to  avoid friction.  The patient has been strongly encouraged to proceed  with the procurement of his custom orthotics.  He has an appointment for  July 3, which is today, but the patient indicates that he has  transportation difficulties.  We have again emphasized the need for  orthotics.  He seems to understand and he indicates that he will proceed  with his orthotics appointment.  If issues are ready, these wraps can be  removed and he can assume the protocol  for adaptation to custom  orthotics.  The patient has been given an opportunity to ask questions.  He seems to understand indicates that he will be compliant.  We will  reevaluate the patient in 1 week p.r.n.      [name], M.D.  Electronically Signed     HAN/MEDQ  D:  11/16/2006  T:  11/16/2006  Job:  [id number]

## 2010-09-28 NOTE — Assessment & Plan Note (Signed)
[facility]   NAME:  [name], [name]               ACCOUNT NO.:  [id number]   MEDICAL RECORD NO.:  [id number]      DATE OF BIRTH:  [birth year]   PHYSICIAN:  Jake Shark A. Tanda Rockers, M.D. VISIT DATE:  01/17/2007                                   OFFICE VISIT   SUBJECTIVE:  [name] is a 77 year old man whom we have followed for  several years for the management of neuropathic ulcers on both feet.  He  most recently had procured custom shoes and inserts and, due to a small  area of ulceration on his left foot, he was being followed in the  nurses' clinic for serial exams.  In the interim, the patient had denied  excessive fever or malodor or drainage and he presents for his routine  nursing clinic followup.  Upon his examined it was noted that his foot  had had significant change with a excessive maceration, breakdown, and  ulceration.  The patient's nursing visit was converted to a physician  visit.   The patient denies interim fever.  He denies excessive activity in spite  of the Labor Day holiday.   OBJECTIVE:  Blood pressure is 127/69, respirations are 20, pulse rate  78, temperature is 98.5.  Inspection of the lower extremities shows that there has been  significant change in the left lower extremity with now 3+ edema on the  volar and medial aspect of the foot.  Bilateral DP,s are palpalble.  There is a large blister with 3-4 cm of undermining circumferentially.  There is a milky clear drainage with hyperemia.  There is local warmth.  There is no tenderness.  The patient has previously been insensate.  There is no extension into the dorsum of the foot nor in the heel.  The  wound was numbered #15.  On the left plantar proximally there is a  satellite wound which connects with the larger wound.  Wound number 12  is a distal plantar wound, and wound #14 involved the left second toe.  All of previous wounds were measured, photographed, and cataloged.  Please refer  to the database entries.  The right lower extremity is  remarkable for 1 to 2+ edema.  There are no new ulcerations.  No  breakdown.   ASSESSMENT:  Significant deterioration of the left neuropathic  ulceration.   PLAN:  We are referring the patient to [facility] for consideration of  inpatient management to include consultation with infectious disease and  orthopedics.  We have cultured this wound, but we have not started him  on antibiotics leaving that decision to the admitting physician.  We  have provided the patient with a hand written note describing today's  findings.  In addition we have packed the ulcerative wound with an  iodoform gauze and a Kerlix.  The patient is to be seen in [facility]  later this morning and we will make this note available through the  electronic medical record.   Administratively, we will discharge this patient from active management  as an outpatient in the wound center pending his evaluation by [facility] and the anticipated admission.  Once the patient has been  stabilized on the inpatient service and is ready for discharge we will  readmit him as an active outpatient management patient.  We have  explained this approach to the patient in terms that he seems to  understand.  We have reviewed the critical  nature of the current physical exam and its potential for major limb  loss.  The patient understands and indicates that he will be compliant.  We will transfer him to his awaiting ride via wheelchair, thus avoiding  any pressure whatsoever on his foot.      [name], M.D.  Electronically Signed     HAN/MEDQ  D:  01/17/2007  T:  01/17/2007  Job:  [id number]   cc:   Melvern Banker

## 2010-09-28 NOTE — Assessment & Plan Note (Signed)
[facility]   NAME:  [name]               ACCOUNT NO.:  [id number]   MEDICAL RECORD NO.:  [id number]      DATE OF BIRTH:  1927-05-02   PHYSICIAN:  Jake Shark A. Tanda Rockers, M.D.      VISIT DATE:                                   OFFICE VISIT   SUBJECTIVE:  [name] is a 77 year old man who we have followed for  several months with neuropathic ulcers involving both feet; most  recently, however, we have followed him for wound #11 involving the  plantar surface of the right foot.  In the interim, we have treated him  with a modified offloading healing sandal.  There has been no interim  excessive drainage, malodor, pain or fever.   OBJECTIVE:  Blood pressure is 128/70, respirations 18, pulse rate 74,  temperature is 98.2.  Inspection of the right plantar surface of the  foot shows that there is a halo of callus surrounding a break in the  skin in the area of the previous ulcer.  There is no evidence of  infection.  There is no excessive drainage.  The callus has been pared  along with some nonviable tissue from the center of the wound; this is a  full-thickness debridement resulted in clear, clean granulation tissue.  Hemorrhage was controlled with direct pressure.  There were no ulcers  discerned on the left foot.   ASSESSMENT:  Clinical improvement of neuropathic ulcer.   PLAN:  We have encouraged the patient to proceed with the procurement of  his custom orthotics.  As soon as he procures this for this foot, he is  to begin wearing.  We will reevaluate him in 1 week while we await his  orthotics      [name], M.D.  Electronically Signed     HAN/MEDQ  D:  11/08/2006  T:  11/09/2006  Job:  [id number]

## 2010-10-01 NOTE — Assessment & Plan Note (Signed)
[facility]   NAME:  [name], [name]               ACCOUNT NO.:  [id number]   MEDICAL RECORD NO.:  [id number]      DATE OF BIRTH:  [birth year]   PHYSICIAN:  Jake Shark A. Tanda Rockers, M.D. VISIT DATE:  05/02/2006                                   OFFICE VISIT   VITAL SIGNS:  Blood pressure is 140/80.  Respirations 18.  Pulse rate  76, and he is afebrile.   PURPOSE OF TODAY'S VISIT:  [name] returns for followup of bilateral  neuropathic ulcerations.  In the interim, he has been treated with  offloading, felt strips, Ultra Mide gel, lambswool and bilateral  Profores.  He denies excessive drainage, pain or malodor.   WOUND EXAM:   WOUND SINCE LAST VISIT:  Inspection of the wounds is as follows:  Wound  #5 on the right plantar surface shows contraction.  There is no  accelerated callus formation and no exudate.  Debridement is not needed.  Wound #6 on the 2nd great toe has a near hypergranulomatous  hypergranulation appearance.  There is no excessive drainage, however.  No malodor.  The wound was probed with Q-tip, and it does not extend to  the joint.  The granulation tissue is solidly adherent to the underlying  bone.  No debridement is needed.  Wound #7 on the left plantar surface  continues to recede.  There is no drainage.  No malodor.   CHANGE IN INTERVAL MEDICAL HISTORY:   DIAGNOSIS:  Improved wounds, responding to adequate offloading.   TREATMENT:   ANESTHETIC USED:   TISSUE DEBRIDED:   LEVEL:   CHANGE IN MEDS:   COMPRESSION BANDAGE:   OTHER:   MANAGEMENT PLAN & GOAL:  We have refashioned the felt strips.  We will  reapply them with Ultra Mide and resume the bilateral Profore lights to  control the presence of the bilateral 2+ edema.  We will reevaluate him  in 1 week p.r.n.           ______________________________  Theresia Majors. Tanda Rockers, M.D.     [name]  D:  05/02/2006  T:  05/03/2006  Job:  [id number]

## 2010-10-01 NOTE — Assessment & Plan Note (Signed)
[facility]   NAME:  [name], [name]               ACCOUNT NO.:  [id number]   MEDICAL RECORD NO.:  [id number]            DATE OF BIRTH:   PHYSICIAN:  Theresia Majors. Tanda Rockers, M.D. VISIT DATE:  06/14/2006                                   OFFICE VISIT   PURPOSE OF TODAY'S VISIT:  [name] is a 77 year old man who we  followed for years, for over a year, regarding neuropathic ulcerations  in both lower extremities.  He has been treated with serial  debridements, custom felt modifications of a healing sandal to offload,  antibiotics and compression wraps for periods of venous hypertension.  He returns for followup, having been interimly evaluated by [facility] for  prosthesis.  He denies excessive drainage, but continues to have  malodor.  He denies pain.  There has been no fever.   WOUND EXAM:  Inspection of his feet show that on the right, there has  been a build up of callus, which was paired without incident.  The  ulcer, itself, has decreased in the area in volume.  There is no  excessive drainage, malodor or evidence of infection.  The left plantar  ulcer is completely resolved.  There is evidence of inadequate  offloading manifested by callus buildup.  The great toe of the left  medially has near resolved.  There is no active infection.  There is a  superficial granulating wound with advance in epithelium.  Wound number  6 of the left toe is completely covered by healthy-appearing  granulation.   DIAGNOSIS:  There is clinically improvement of all the wounds.   MANAGEMENT PLAN AND GOAL:  The need for adequate offloading via custom  orthotics has been referred to [facility].  We will reapply modified healing  sandals, using felt strips to offload the feet as best as possible.  We  will reevaluate the patient in 1 week.  In the interim, he will also be  seen by [facility] for proceeding with the custom orthotics.           ______________________________  Theresia Majors Tanda Rockers,  M.D.     [name]  D:  06/14/2006  T:  06/14/2006  Job:  [id number]

## 2010-10-01 NOTE — Assessment & Plan Note (Signed)
[facility]   NAME:  [name], [name]               ACCOUNT NO.:  [id number]   MEDICAL RECORD NO.:  [id number]      DATE OF BIRTH:  February 17, 1927   PHYSICIAN:  Jonelle Sports. Sevier, M.D.  VISIT DATE:  10/12/2005                                     OFFICE VISIT   VITAL SIGNS:  Examination today:  Blood pressure 126/84, pulse 66 regular,  respirations 16 and temperature 98.4.   PURPOSE OF TODAY'S VISIT:  HISTORY:  This 77 year old white male with  chronic midfoot disarticulations related to degenerative arthritis, but  constituting almost a midfoot Charcot situation.  He has been treated here  for months on a palliative basis for plantar ulcerations.  The left foot is  currently healed, but he persists with a day plantar midfoot ulcer on the  right.   WOUND EXAM:  The ulcer on the plantar aspect of the midfoot on the right now  measures 1.3 x 1 x 0.3 cm in depth with moderate shears drainage, moderate  odor and no eschar, but 100% slough in the wound base.   WOUND SINCE LAST VISIT:  Since last visit, the patient reports no increase  in secretions or pain, or other clinical symptoms of concern.   TREATMENT:  The plan is to continue the doughnut offloading of that section  of the foot as well as possible and then placing the foot in an Unna wrap.   TISSUE DEBRIDED:  DISPOSITION:  The wound is full thickness debrided,  reducing some of the callous through margin and also to breathing the slough  from the wound base.  It now looks clean and granular.   MANAGEMENT PLAN & GOAL:  Followup visit will be here in 7-9 days.           ______________________________  Jonelle Sports Cheryll Cockayne, M.D.     RES/MEDQ  D:  10/12/2005  T:  10/12/2005  Job:  [id number]

## 2010-10-01 NOTE — Assessment & Plan Note (Signed)
[facility]   NAME:  [name], [name] NO.:  [id number]   MEDICAL RECORD NO.:  [id number]      DATE OF BIRTH:  08/05/1926   PHYSICIAN:  Maxwell Caul, M.D.      VISIT DATE:                                   OFFICE VISIT   [name] returns today for his followup of his severe Charcot  deformities in his feet and venous stasis.  He does have a tiny  recurrent wound at the base of his second toe on the dorsal aspect.  Otherwise, his feet look as good as I have seen them.  To date, we have  been using soft doughnuts, Ultra Mide, lamb's wool between his toes, and  healing sandals.   WOUND EXAM:  He has mild edema, chronic stasis changes, and severe  Charcot deformities of both feet.  The small ulcer at the base of his  second toe looks clean.  No debridements were done.   DIAGNOSIS:  Severe bilateral deformities with Charcot deformities in his  feet together with venous stasis and neuropathic ulcers.   We have applied Iodosorb to the small wound on his left foot, Ultra Mide  to his legs, a doughnut, healing sandals, and Profore wraps.  Exactly  when he is going to obtain his prescription footwear is unclear.  He now  thinks it will be a month.  Without this, I do not see a good way of  preventing this gentleman from having severe recurrent ulcerations,  which have been difficult to heal in the past.           ______________________________  Maxwell Caul, M.D.     MGR/MEDQ  D:  09/08/2006  T:  09/08/2006  Job:  [id number]

## 2010-10-01 NOTE — Assessment & Plan Note (Signed)
[facility]   NAME:  [name], [name]               ACCOUNT NO.:  [id number]   MEDICAL RECORD NO.:  [id number]      DATE OF BIRTH:  1926/12/12   PHYSICIAN:  Theresia Majors. Tanda Rockers, M.D. VISIT DATE:  12/29/2005                                     OFFICE VISIT   VITAL SIGNS:  His blood pressure is 136/81, pulse rate of 20, respirations  72, temperature 98.2.   PURPOSE OF TODAY'S VISIT:  [name] is a 77 year old man who represents  complaining of pain, swelling and redness in his right foot.  The patient is  well known to the clinic.  He has been followed for neuropathic ulcer  involving his right foot.  He had been treated with off loading felt  doughnuts and compressive wraps.  He complained of increasing pain and  redness.  He denies fevers.  His dressings have become saturated and he  thought this was due to the increase heat and sweating.   WOUND EXAM:  Inspection of the lower extremities shows that there is 2+  edema with chronic changes of stasis on the plantar aspect of the right  foot.  The previous ulcer has been macerated.  There is a large hood of  epithelium that has overgrown the ulcer.  The ulcer still has an adequate  outlet for drainage, however.  This nonviable macerated tissue was  completely debrided with a #10 blade with hemorrhage control with direct  pressure and silver nitrate.  There is associated cellulitis on the medial  aspect of the leg extending around toward the ankle.  Capillary refill is  normal.  The pulse remained bounding.  The wound was cultured.   DIAGNOSIS:  Probable cellulitis related to neuropathic ulcer.  Neuropathic  ulcer adequately debrided.   MANAGEMENT PLAN & GOAL:  We will continue to off load the patient with a  felt doughnut.  We have added a Select Silver Uniflow absorptive dressing.  We will reevaluate the patient on January 02, 2006.  We have also given the  patient a prescription for Keflex 500 mg a total dose of  28 tablets to take  1 four times a day.  If the patient develops progressive pain, swelling or  fever he is to be seen by his primary care physician or in the emergency  room.           ______________________________  Theresia Majors Tanda Rockers, M.D.     [name]  D:  12/29/2005  T:  12/29/2005  Job:  [id number]

## 2010-10-01 NOTE — Assessment & Plan Note (Signed)
[facility]   NAME:  MAYO, FAULK               ACCOUNT NO.:  [id number]   MEDICAL RECORD NO.:  [id number]      DATE OF BIRTH:  03-05-1927   PHYSICIAN:  Jonelle Sports. Sevier, M.D.  VISIT DATE:  09/21/2005                                     OFFICE VISIT   WOUND CENTER PROGRESS NOTE   HISTORY:  This 77 year old white male is followed for ongoing care of a  pressure wound on the plantar aspect of the right foot.   The patient has complex mid and hind foot deformities, and really can amount  to a Charcot situation although he does not have diabetes or any other known  neuropathy.  There is extensive overriding and both feet have been ulcerated  in the cuboideonavicular areas on the plantar aspect.  The left foot has  healed and the patient is in a custom shoe there.   His persistent ulcer on the plantar aspect of the right foot has not been  complicated by deep infection or any other such complications, but has been  slow to progress.  Since the past visit, the patient reports no increasing  discomfort and no increase in odor, no fever or systemic symptoms.  He has  had no change in medications.   EXAMINATION:  VITAL SIGNS:  On examination, blood pressure is 158/100, pulse  is 60, respirations are 14 and temperature is 98.3.  EXTREMITIES:  The wound itself is examined and debrided, following removal  of his previous Profore wrap and is noted to be essentially unchanged in  size at 1.5 x 2 x 0.2 cm.  The peri-wound, however, seems less macerated and  more healthy than has been previously true.  There is moderate serous  drainage from the wound it self, which has a 2+ odor, but there is no slough  and there is no eschar formation.   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   DISPOSITION:  First, the wound margins and the surrounding callus are  sharply debrided, and again it is noted that the maceration and other peri-  wound damage is  less than previously noted.   Following debridement, the entire area is washed with Dakin solution and  dried well.  The wound is dressed with antiseptic ointment and a dry  dressing over that.  XenoDerm is applied to the peri-wound area.  The foot  is then redressed with a modified doughnut to the wound area based on a  pattern we have maintained, which has been successful in the past and that  extremity is placed in a Profore wrap.   It is mentioned to the patient that his blood pressure seems a bit elevated  and he promises that he will follow up on that with his primary doctor.   Follow up visit will be here and 1 week.   (Incidentally, it is noted that the patient is cared in a palliative status  in this clinic because with his working actively and our inability otherwise  effectively to offload this area, healing will be extremely slow to occur if  ever.)           ______________________________  Jonelle Sports. Cheryll Cockayne, M.D.  RES/MEDQ  D:  09/21/2005  T:  09/22/2005  Job:  [id number]

## 2010-10-01 NOTE — Assessment & Plan Note (Signed)
[facility]   NAME:  [name], [name] NO.:  [id number]   MEDICAL RECORD NO.:  [id number]      DATE OF BIRTH:  03/23/1927   PHYSICIAN:  Maxwell Caul, M.D. VISIT DATE:  08/25/2006                                   OFFICE VISIT   VITAL SIGNS:  On examination, the patient is in no distress.  Temperature 98.2, pulse 91, respirations 20, blood pressure 120/83.   PURPOSE OF TODAY'S VISIT:  [name] is a 77 year old man who has been  followed here for a neuropathic ulcer secondary to severe Charcot  deformities in his feet.  Actually today he tells me he thinks his feet  look as good as they have in quite some time.  He has been looking at  getting custom-made footwear and has actually been to see the people who  do this.  He has expressed concern that he is severely allergic, based  on past history, to formaldehyde that is used in the curing of leather  used to make shoes.  Apparently, he has discussed this with the people  making the shoes.  In the meantime, he has not had any new wounds or any  new problems.  He has been treated with his Profore wraps, felt donuts  to offload his feet, and healing sandals.   WOUND EXAM:  Inspection of his lower extremities shows there is mild  edema, chronic stasis changes.  However, these remain controlled with  wraps.  There is one small ulcer on the ventral surface of his left  second toe.  I think this is largely secondary to overriding and  pressure from the first toe.  No debridements were required.  Actually,  his feet look as good as I have seen them in quite some time.   DIAGNOSIS:  Severe bilateral foot deformities associated with venous  stasis and neuropathic ulcers.   MANAGEMENT PLAN & GOAL:  We have continued the Iodoform gel, lamb's wool  between his toes, Kerlix for the second toe.  We have, again, modified  his donuts, and he will continue with his healing sandal and Profore  wraps.  We  will continue doing this until the patient manages to get  proper diabetic foot wear.  In the past, when we have stopped this, his  wounds deteriorate dramatically.           ______________________________  Maxwell Caul, M.D.     MGR/MEDQ  D:  08/25/2006  T:  08/25/2006  Job:  [id number]

## 2010-10-01 NOTE — Assessment & Plan Note (Signed)
[facility]   NAME:  [name], [name]               ACCOUNT NO.:  [id number]   MEDICAL RECORD NO.:  [id number]          DATE OF BIRTH:   PHYSICIAN:  Theresia Majors. Tanda Rockers, M.D. VISIT DATE:  06/07/2006                                   OFFICE VISIT   VITAL SIGNS:  Blood pressure is 131/80, respirations 18, pulse rate 73,  temperature 97.9.   PURPOSE OF TODAY'S VISIT:  [name] is a 77 year old man who we follow  for neuropathic ulcers involving his feet.  We have treated him with  customized felt offloading hard shoe inserts into a healing sandal.  He  reports that he has had no excessive drainage, malodor or fever.   WOUND EXAM:  Inspection of the right lower extremity, the right foot  shows that the plantar ulcer has contracted significantly.  There is no  excessive drainage.  There is 100% granulation.  The previously placed  offloading felt has been effective.  Wounds number 6, 7 and 8 are on the  left lower extremity.  Wound number 6 is of the left second toe, which  shows advancement of epithelium and scant drainage.  Wound number 7, on  the plantar surface, is completely resolved and wound number 8 of the  left great toe shoes significant reduction in volume.  None of these  wounds needed debridement.   DIAGNOSIS:  Continued improvement with adequate offloading.   MANAGEMENT PLAN & GOAL:  We will continue felt offloading, utilizing the  open hard shoe configurations with Profore light wrappings.  Calcium  alginate between the toes and Ultra Mide cream.  [name] has arranged  with [employer] to be present during his next office visit, at which time,  they will take impressions for the orthotics to his left foot.           ______________________________  Theresia Majors. Tanda Rockers, M.D.     [name]  D:  06/07/2006  T:  06/07/2006  Job:  [id number]

## 2010-10-01 NOTE — Consult Note (Signed)
[facility]  Patient:    [name], [name]                      MRN: [id number] Proc. Date: 02/02/00 Adm. Date:  [id number] Attending:  Sharren Bridge CC:         Norval Gable. [place], M.D.   Consultation Report  [facility] CONSULTATION  HISTORY:  This 77 year old white male is referred by Dr. Norval Gable. [place] for assistance with the management of an ulcerated lesion of the right foot.  The patient has history of some limited arthritis which is not thought, however, to be rheumatoid.  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  This has resulted in an extreme valgus or abduction position of both feet.  As a result, he has developed an ulcer on the area of pressure at the medial overriding of the right foot.  This has been complicated by a heaped-up callus formation around that ulcer and has been treated at some length apparently by Dr. Alvia Grove Regal.  The patient has been fitted with custom inserts and high-top shoes which are also custom in nature.  This has not results in healing of the ulcer and he continues with a very large, open, draining ulcer with the heaped-up callus surrounding it. He is referred now for our evaluation and treatment.  PAST MEDICAL HISTORY:  Past medical history is notable for those things previously mentioned.  There is some history of tibial surgery on the right, as well some years ago, but the details of this are unknown.  CURRENT MEDICATIONS:  Norvasc, Lasix, K-Dur and Neosporin, which he has used on the areas of rash.  He apparently has used Regranex alternating with ______ on the ulcer itself.  EXAMINATION  EXTREMITIES:  Examination today is  limited to the distal lower extremities. The feet are quite deformed with medial overriding of the talus bilaterally, creating kind of a calcaneovalgus deformity of both feet.  The toes are rather clawed on each side.  There is no significant edema.  Pulses are detected only by Doppler determination bilaterally.  Skin temperatures, however, are quite adequate and equal bilaterally.  There are numerous minimal calluses on the weightbearing surfaces of both feet that are minimal in nature; however, on the right foot where the talus strikes down to the weightbearing surface, there is a large ulceration measuring 18 x 29 mm and 5 mm in depth, but without penetration to bone, which surrounded by an area of heaped-up callus some 105 x 95 mm.  DISPOSITION 1. The patient is given general instruction regarding the care of his feet by    video, with nurse and physician reinforcement. 2. His current footwear is evaluated and appears to be essentially adequate    for what it is, but there is concern that without some kind of patellar    tendon-bearing apparatus, he will likely not ever be able to maintain    healing.  He says that he has tried such apparatus in the past and this was    not tolerated. 3. The heaped-up callus around the ulcer of the right foot is sharply pared    and very quickly  one encounters bleeding which requires extensive silver    nitrate cautery.  The wound base itself is relatively clear. 4. The patient is scheduled for twice-weekly hydrotherapy with whirlpool    and/or pulse lavage and is advised to dress the ulcer itself only with the    ______ at this point and not to use the Regranex. 5. Return appointment is arranged for him six days hence, at which time he    will be seen by the orthopedic surgeon to see if anything corrective    regarding these feet from an orthopedic point of view is possible and    indicated.  s DD:  02/02/00 TD:  02/03/00 Job:  [id number]

## 2010-10-01 NOTE — Assessment & Plan Note (Signed)
[facility]   NAME:  ICARUS, PARTCH               ACCOUNT NO.:  [id number]   MEDICAL RECORD NO.:  [id number]      DATE OF BIRTH:  22-Dec-1926   PHYSICIAN:  Theresia Majors. Tanda Rockers, M.D. VISIT DATE:  12/07/2005                                     OFFICE VISIT   VITAL SIGNS:  Blood pressure is 120/70, respirations 16, pulse rate 76, and  he is afebrile.   PURPOSE OF TODAY'S VISIT:  [name] returns for followup of a neuropathic  ulcer on his right lower extremity.   WOUND EXAM:  Inspection of the wound shows that there is healthy granulation  throughout a contracting base.  There is a halo of callus which did not need  debriding or paring.  The wound was cleansed and a custom felt pad was  fashioned in the clinic and the patient was redressed.   WOUND SINCE LAST VISIT:  During the interim he has worn a custom felt pad  for offloading with a modified Profore Light and a Darco healing sandal.  He  reports there has been decreased drainage, no significant pain, and no  excessive malodor.   DIAGNOSIS:  Improved wound with adequate offloading.   MANAGEMENT PLAN & GOAL:  We will reevaluate him in 2 weeks p.r.n.           ______________________________  Theresia Majors. Tanda Rockers, M.D.     [name]  D:  12/07/2005  T:  12/07/2005  Job:  [id number]

## 2010-10-01 NOTE — Assessment & Plan Note (Signed)
[facility]   NAME:  [name], [name]               ACCOUNT NO.:  [id number]   MEDICAL RECORD NO.:  [id number]      DATE OF BIRTH:  [birth year]   PHYSICIAN:  Theresia Majors. Tanda Rockers, M.D. VISIT DATE:  06/29/2006                                   OFFICE VISIT   VITAL SIGNS:  Vital signs:  Blood pressure is 135/75, respirations 20,  pulse rate 83, temperature 97.6.   PURPOSE OF TODAY'S VISIT:  [name] is a 77 year old man who we are  following for neuropathic ulcers in both feet.  The patient has been  molded for custom orthotics and while awaiting his custom orthotics we  have agreed to continue to change his dressings and provide failed  offloading to avoid progression of his ulcers.  In the interim, he  denies excessive drainage, malodor, pain or fever.   WOUND EXAM:  Inspection of the lower extremities shows that the chronic  stasis changes are persistent with 2+ bilateral edema.  The ulcers  themselves have continued to show improvement.  Wound #5 has decreased,  as wounds numbers 6 and 8.  Neither of these wounds required  debridement.   DIAGNOSIS:  Clinical improvement tendon with adequate offloading.   MANAGEMENT PLAN & GOAL:  We will continue failed offloading and  bilateral Profore Lite dressings with Ultra__________  cream.  We will  replace lambs wool between the toes and see the patient in one week.           ______________________________  Theresia Majors. Tanda Rockers, M.D.     [name]  D:  06/29/2006  T:  06/30/2006  Job:  [id number]

## 2010-10-01 NOTE — Procedures (Signed)
[facility]  Patient:    [name], [name]                      MRN: [id number] Proc. Date: 11/05/99 Adm. Date:  [id number] Disc. Date: [id number] Attending:  Rich Brave CC:         Fayne Norrie, M.D. at [facility]                           Procedure Report  PROCEDURE:  Colonoscopy (incomplete).  ENDOSCOPIST:  Florencia Reasons, M.D.  ANESTHESIA:  INDICATIONS FOR PROCEDURE:  A 77 year old gentleman with a history of very large sigmoid polyp, removed colonoscopically several years ago with most recent surveillance colonoscopy 3-1/2 years ago, having been unremarkable.  FINDINGS:  Incomplete exam due to looping of the scope which prevented reaching the cecum.  No polyps seen.  DESCRIPTION OF PROCEDURE:  The nature, purpose, and risks of the procedure were familiar to the patient from prior examination, and he provided written consent.  Sedation for this procedure consisted merely of the fentanyl 50 mcg and Versed 5 mg IV which had been given to him just prior to his upper endoscopy which preceded this procedure.  No additional sedation was given for the colonoscopy.  The patient was awake, but comfortable throughout the procedure.  The Olympus adult video colonoscope was advanced as far as I could, into what I believe was the mid transverse colon, although we did not achieve transillumination.  Despite taking out loops, applying extensive external abdominal compression, and turning the patient in the supine and right lateral decubitus positions, I was unable to get pass the mid transverse colon or whatever area it was that I had reached at that point.  I then took the scope most of the way out and reinserted with the hopes of being able to get a different approach of the colon.  Since it was still unsuccessful, we removed the endoscope and switched to the adjustable tension adult video colonoscope, but this performed  no better than the standard adult colonoscope, upon reaching the region of the mid transverse colon.  During pull out, I saw no polyps, cancer, or colitis, although a rare diverticulum in the left colon was observed.  No biopsies were obtained during this procedure which the patient tolerated very well and without any apparent complication.  IMPRESSION:  Normal limited colonoscopy other than rare left-sided diverticulum.  No evidence of recurrent colon polyps up to the limit of this examination, felt to be the mid region of the colon.  PLAN:  Proceed to barium enema this afternoon. DD:  11/05/99 TD:  11/08/99 Job: [id number]

## 2010-10-01 NOTE — Assessment & Plan Note (Signed)
[facility]   NAME:  [name], [name]               ACCOUNT NO.:  [id number]   MEDICAL RECORD NO.:  [id number]      DATE OF BIRTH:  [birth year]   PHYSICIAN:  Jonelle Sports. [place], M.D.  VISIT DATE:  10/19/2005                                     OFFICE VISIT   VITAL SIGNS:  Blood pressure 130/70, pulse 76 and regular, respirations 16,  temperature 98.3.   PURPOSE OF TODAY'S VISIT:  This 77 year old white male is followed in a  palliative capacity for management of an open ulcer on the mid aspect of the  plantar right foot.  This is related to undue pressure in association with a  chronic subluxation in this area.   He is seen today after being removed from an Unna wrap placed here 8 days  ago.   The patient reports no pain in the area, no increased drainage, no increased  odor, no other change, and he has had no change in his medications or other  medical management.   WOUND EXAM:  Again seen is the open wound on the plantar aspect of the right  midfoot in an area of extensive callus formation.  The altered self after  debridement (a small amount of undermining present at 12 o'clock to 4  o'clock) and after debridement, the ulcer measures 1.8 x 1.6 x 0.2 cm.  The  base is clean.   WOUND SINCE LAST VISIT:   CHANGE IN INTERVAL MEDICAL HISTORY:   DIAGNOSIS:  Stable course in foot ulcer being followed palliatively.   TREATMENT:  The wound is dressed with an application of a doughnut covering  the entirety of the midfoot and the medial aspect of the foot in an effort  to offload this area.  The wound itself is dressed with an application of  Neosporin.  Ultra Mide 25 is used to address the scaly skin on the entirety  of the lower leg.   The entire right lower extremity from the base of the toes to the knees is  then placed in a Unna wrap in order to offset the edema.   ANESTHETIC USED:   TISSUE DEBRIDED:   LEVEL:   CHANGE IN MEDS:   COMPRESSION  BANDAGE:   OTHER:   MANAGEMENT PLAN & GOAL:  Follow-up visit will be here in 8 days.           ______________________________  Jonelle Sports Cheryll Cockayne, M.D.     RES/MEDQ  D:  10/19/2005  T:  10/19/2005  Job:  [id number]

## 2010-10-01 NOTE — Assessment & Plan Note (Signed)
[facility]   NAME:  [name], [name]               ACCOUNT NO.:  [id number]   MEDICAL RECORD NO.:  [id number]      DATE OF BIRTH:  [birth year]   PHYSICIAN:  Maxwell Caul, M.D. VISIT DATE:  03/31/2006                                     OFFICE VISIT   VITAL SIGNS:   PURPOSE OF TODAY'S VISIT:  Followup of neuropathic change ulcer in a Charcot  foot.   [name] is a gentleman who has been followed here episodically, last  being seen a month ago.  He tells me that he started to feel a discomfort  with increasing drainage on the bottom of his foot.  He tried to make an  appointment in this clinic a week ago; however, we had no openings until  today.  In the interim, he went to [facility] and is receiving an  antibiotic four times a day for possible cellulitis (probably Keflex).  The  patient still states he is having a fair amount of drainage and, although he  does not feel perfectly well in his feet, he feels that something is indeed  different.   WOUND EXAM:  On examination he has a very deformed foot on the right.  There  was a large amount of callus on the bottom of this foot that was debrided as  fully as possible today.  A pocket of purulent material in this was cultured  for CNS.  There was erythema around the superior aspect of this deformed  area.  However, no convincing evidence of progressive cellulitis.  By the  patient's description, the color changes in his feet appear about normal.   WOUND SINCE LAST VISIT:   CHANGE IN INTERVAL MEDICAL HISTORY:   DIAGNOSIS:  Large neuropathic ulcer on the bottom of his right foot.  This  is in the middle of a Charcot deformity.  The large amount of callus was  debrided and a culture sent.  Initially I was somewhat concerned about  surrounding cellulitis here; however, after discussion with the staff and  the patient, it would seem that this looks much the same as per usual.  I  believe he is on Keflex as  prescribed by his primary doctor.   TREATMENT:  We went ahead and applied an off-loading donut with a bulky  dressing.  We used a Darco sandal to try to offload this area.  We will  await cultures of the drainage for CNS.  Also, one would wonder about total  contact casting once the infection is controlled here.   ANESTHETIC USED:   TISSUE DEBRIDED:   LEVEL:   CHANGE IN MEDS:   COMPRESSION BANDAGE:   OTHER:   MANAGEMENT PLAN & GOAL:  We will see him again early part of next week  before the long weekend.           ______________________________  Maxwell Caul, M.D.     MGR/MEDQ  D:  03/31/2006  T:  04/01/2006  Job:  [id number]

## 2010-10-01 NOTE — Assessment & Plan Note (Signed)
[facility]   NAME:  [name], [name]               ACCOUNT NO.:  [id number]   MEDICAL RECORD NO.:  [id number]           DATE OF BIRTH:   PHYSICIAN:  Theresia Majors. Tanda Rockers, M.D. VISIT DATE:  05/31/2006                                   OFFICE VISIT   VITAL SIGNS:  Blood pressure is normal, pulse rate normal, he is  afebrile.   PURPOSE OF TODAY'S VISIT:  [name] is a 77 year old man who we have  followed for 4 years for management of neuropathic ulcers of his feet.  Most recently he has had failed offloading horseshoe fills for  offloading augmented with calcium alginate between his 1st and 2nd toes  of the left foot.  He reports that there has been decreased drainage, no  excessive malodor.  He has recently been exceptionally active during a  recent excursion to the mountains.  He denies fever.   WOUND EXAM:  Inspection of the lower extremity shows that the volar  wound on the left foot is completely healed.  There is some area of  callus, but this did not need paring.  Between the 1st and the 2nd toes  of the left foot, the ulcers have 99% healed as a result of the calcium  alginate in the positions.  On the right volar medial foot, the ulcer  has dramatically decreased in volume.  There has been a wrap injury on  the proximal ankle associated with progressive inversion of the ankle.   WOUND SINCE LAST VISIT:  Overall clinical improvement.   MANAGEMENT PLAN & GOAL:  We will reapply the horseshoe felts to  offload the right lower wounds.  We will refer the patient to [employer]  for modification of his custom orthotics for continued offloading of his  left foot.  We have explained this approach to the patient in terms that  he seems to understand.  We will continue to see him weekly to assure  that his wounds do not progress.           ______________________________  Theresia Majors Tanda Rockers, M.D.     [name]  D:  05/31/2006  T:  05/31/2006  Job:  [id number]

## 2010-10-01 NOTE — Assessment & Plan Note (Signed)
[facility]   NAME:  [name], [name] NO.:  [id number]   MEDICAL RECORD NO.:  [id number]            DATE OF BIRTH:   PHYSICIAN:  Maxwell Caul, M.D. VISIT DATE:  07/28/2006                                   OFFICE VISIT   PURPOSE OF TODAY'S VISIT:  Continued followup of severe bilateral  neuropathic changes in his bilateral feet with Charcot deformities,  which are severe.  He has had multiple parings and debridements together  with this and offloading his wounds have healed.  He still apparently  has 4-6 weeks before he will be able to obtain custom made diabetic foot  wear.   WOUND EXAM:  He continues to have severe Charcot type deformities.  There is chronic stasis changes.   DIAGNOSIS:  Resolved wounds with persistent Charcot changes and risks  for potential further breakdown.   MANAGEMENT PLAN & GOAL:  We have continued to allow him to come here  weekly to have his legs wrapped.  We also have custom made doughnuts for  the neuropathic changes in his feet with Charcot deformity.  He  continues in a healing sandal.  There is no open wounds.  However, I  have no doubt that if we allowed him to wait 5 or 6 weeks, wounds would  reoccur.  We will see him again next week.           ______________________________  Maxwell Caul, M.D.     MGR/MEDQ  D:  07/28/2006  T:  07/29/2006  Job:  [id number]

## 2010-10-01 NOTE — Assessment & Plan Note (Signed)
[facility]   NAME:  PAYAM, GRIBBLE NO.:  [id number]   MEDICAL RECORD NO.:  [id number]            DATE OF BIRTH:   PHYSICIAN:  Maxwell Caul, M.D.      VISIT DATE:                                   OFFICE VISIT   PURPOSE OF TODAY'S VISIT:  Review.  [name] is a gentleman we have  been following for several months with bilateral neuropathic ulcers.  He  has severe bilateral Charcot deformities of both feet.  He has a linear  ulcer in the middle of the Charcot deformity on the dorsal aspect of the  right foot.  He has been managed with healing sandals with donut felt  offloading.  He has not had any fever, infection, or pain.   WOUND EXAMINATION:  His left foot actually looks fine.  He is afebrile  with a temperature of 98.3.  The linear ulceration appears to be  granulated; however, it does not look much better than the last time.  He has bilateral stasis edema and a small abrasion on the lateral aspect  of the right foot Charcot deformity.   IMPRESSION:  Largely neuropathic ulcers in his right foot.  We have  continued with modified felt donut offloading.  He has continued with  healing sandals bilaterally.  We were able to transition into his own  graded pressure stocking on the left; however, we have continued with a  Profore Lite wrap on the right.  Apparently [name] managed to obtain  diabetic footwear; however, for reasons I am not totally clear, they  were not satisfactory and another pair has been re-ordered.   I have emphasized to him that I want him to completely offload the right  foot.  I think he is actually spending far too much time on his feet,  and the condition of his healing sandals today was indicative of this.  We did replace them, but I emphasized that especially on the right foot,  I want minimal weightbearing to allow the linear area to try and heal  over.           ______________________________  Maxwell Caul, M.D.     MGR/MEDQ  D:  10/20/2006  T:  10/20/2006  Job:  [id number]

## 2010-10-01 NOTE — Assessment & Plan Note (Signed)
[facility]   NAME:  [name], [name]               ACCOUNT NO.:  [id number]   MEDICAL RECORD NO.:  [id number]      DATE OF BIRTH:  1927/03/05   PHYSICIAN:  Jake Shark A. Tanda Rockers, M.D.      VISIT DATE:                                   OFFICE VISIT   VITAL SIGNS:  Blood pressure is 132/67.   PURPOSE OF TODAY'S VISIT:  [name] is a 77 year old man who had  followed by bilateral neuropathic ulcerations related to an insensate  foot and marked deformities.  During the interim he reports a new  ulceration on the medial aspects of the left great toe.  He denies  fever.   WOUND EXAM:  Wound #5 on the right plantar surface of the foot shows  decrease in volume.  The wound is clean, there is 100% granulation, and  no drainage.  Wound #7 on the plantar aspect of the left foot is near  healed.  There is scant drainage.  No induration.  No inflammation.  No  cellulitis.  Wound #6 on the left second toe is completely covered with  granulation with a moist texture but no malodor.  The new wound, #8, on  the medial left great toe is a full thickness blister.  No debridement  was required.   DIAGNOSIS:  Improvement in 3 out of 4 wounds.   MANAGEMENT PLAN & GOAL:  We will continue offloading with felt donut  strips and healing sandals.  We will continue moderate compression with  multi-layered wraps.  We will institute a calcium alginate dressing over  the new wound and evaluate him in 1 week.           ______________________________  Theresia Majors. Tanda Rockers, M.D.     [name]  D:  05/10/2006  T:  05/11/2006  Job:  [id number]

## 2010-10-01 NOTE — Assessment & Plan Note (Signed)
[facility]   NAME:  [name], ACKERLEY               ACCOUNT NO.:  [id number]   MEDICAL RECORD NO.:  [id number]      DATE OF BIRTH:  09/09/1926   PHYSICIAN:  Jonelle Sports. Sevier, M.D.  VISIT DATE:  11/09/2005                                     OFFICE VISIT   VITAL SIGNS:  Blood pressure 120/70, pulse 76 and regular, respirations 20,  temperature 98.2.   PURPOSE OF TODAY'S VISIT:  This 77 year old white male with severely  deformed feet and ankles secondary to degenerative arthritis is seen for  palliative management of a chronic ulceration on the plantar aspect of the  right foot.   WOUND EXAM:  The wound on the plantar aspect of the right mid foot measures  1.0 x 1.3 x 0.4 cm and is surrounded by mildly macerated callus.  There is  beyond this callus on the plantar aspect of the mid foot and area of very  early maceration that improves even in the office here with exposure to air.   WOUND SINCE LAST VISIT:  He reports that since last visit he has had no  increase in pain, does feel like the drainage perhaps increased with a  little sense of maceration under his dressing but has had no increase in  odor, no fever or systemic symptoms.   DIAGNOSIS:  Satisfactory course in ulcer that is receiving palliative care.   MANAGEMENT AND PLAN:  The surrounding callus is softly pared and the wound  margins which are calloused as well are full-thickness pared and tapered.  There is a small amount of slough in the wound base and this is removed as  well.  This debridement is actually done by Dr. Tressia Danas, family practice  resident, with my supervision.   The wound is dressed with an application of Neosporin covered by a small 2x2  and the area is protected with a double-thickness felt doughnut.  The  extremity is treated with a generalized application of Ultramide 25 and then  returned to a Profore light wrap.   Followup visit will be here in 12 days.     ______________________________  Jonelle Sports Cheryll Cockayne, M.D.     RES/MEDQ  D:  11/09/2005  T:  11/09/2005  Job:  [id number]

## 2010-10-01 NOTE — Assessment & Plan Note (Signed)
[facility]   NAME:  [name], [name]               ACCOUNT NO.:  [id number]   MEDICAL RECORD NO.:  [id number]      DATE OF BIRTH:  1927-02-04   PHYSICIAN:  Theresia Majors. Tanda Rockers, M.D. VISIT DATE:  01/26/2006                                     OFFICE VISIT   VITAL SIGNS:  Blood pressure is 160/80, respirations 16, pulse rate 72 and  he is afebrile.   PURPOSE OF TODAY'S VISIT:  [name] is a 77 year old man with neuropathic  ulcer involving his right foot. He has been treated in the wound center with  various forms of off loading and debridement. He returns for followup. He  denies pain, fever, or malodor.   WOUND EXAM:  Inspection of the wound shows that there is complete re-  epithelization, and the wound is resolved.   WOUND SINCE LAST VISIT:   CHANGE IN INTERVAL MEDICAL HISTORY:   DIAGNOSIS:  Resolved wound.   TREATMENT:   ANESTHETIC USED:   TISSUE DEBRIDED:   LEVEL:   CHANGE IN MEDS:   COMPRESSION BANDAGE:   OTHER:   MANAGEMENT PLAN & GOAL:  We fitted the patient temporarily with an off-  loading donut and a bulky dressing. We have instructed him to return home  and to begin wearing his custom shoe insert and support hose in the morning.  We are discharging him. We will see him in followup on a p.r.n. basis.           ______________________________  Theresia Majors Tanda Rockers, M.D.    [name]  D:  01/26/2006  T:  01/27/2006  Job:  [id number]

## 2010-10-01 NOTE — Assessment & Plan Note (Signed)
[facility]   NAME:  CATO, LIBURD               ACCOUNT NO.:  [id number]   MEDICAL RECORD NO.:  [id number]            DATE OF BIRTH:   PHYSICIAN:  Theresia Majors. Tanda Rockers, M.D. VISIT DATE:  06/14/2006                                   OFFICE VISIT   ADDENDUM   I have discussed with the orthotics needs with [employer] regarding Velora Mediate.  We have recommended that he receive bilateral high top custom  mold shoes.  We have given him a prescription for the same and he is to  proceed with being casted at this point.  We recognized that he has not  completely healed, but there never be complete healing without custom  orthotics.  We have explained this to the patient in terms that he seems  to understand.  [name] will make his appointments with [employer].  We  will continue to follow him in the Wound Center on a weekly basis to  ensure that there is no excessive break down.           ______________________________  Theresia Majors. Tanda Rockers, M.D.     [name]  D:  06/14/2006  T:  06/14/2006  Job:  [id number]

## 2010-10-01 NOTE — Assessment & Plan Note (Signed)
[facility]   NAME:  QUANTRELL, SPLITT NO.:  [id number]   MEDICAL RECORD NO.:  [id number]            DATE OF BIRTH:   PHYSICIAN:  Maxwell Caul, M.D. VISIT DATE:  01/06/2006                                     OFFICE VISIT   [name] was seen on 08/20.  He had an infection on his right first toe on  the volar aspect.  This cultured staph aureus which was resistant to  penicillin but otherwise pansensitive.  He was treated with Keflex for one  week.   On examination of right toe, the area is almost completely resolved.  There  is no evidence of continued infection here and everything appears much  better.  On the plantar aspect, he continues to have a largely neuropathic-  looking wound.  The ulcer base is clean.  There is a large amount of  surrounding hyperkeratotic skin.  He puts a donut over this to relieve  pressure in his pressure relieving shoe.   On examination of wound, skin on the first toe looks much better.  There is  no ulcer present.  The Keflex has resulted in improved cellulitis.   Neuropathic ulcer.  The base of this appears clean.  It does not require any  topical antibiotic.  It will eventually probably need extensive debridement  of the skin around this to see if we can get a base to consider healing.  In  the meantime, his cellulitis on the right toe is markedly better.      Maxwell Caul, M.D.  Electronically Signed     MGR/MEDQ  D:  01/06/2006  T:  01/06/2006  Job:  [id number]

## 2010-10-01 NOTE — Assessment & Plan Note (Signed)
[facility]   NAME:  [name], OSE NO.:  [id number]   MEDICAL RECORD NO.:  [id number]      DATE OF BIRTH:  [birth year]   PHYSICIAN:  Maxwell Caul, M.D. VISIT DATE:  10/27/2006                                   OFFICE VISIT   PURPOSE OF TODAY'S VISIT:  Continued followup of bilaterally neuropathic  ulcers associated with Charcot deformities in both his feet.  He has had  a linear ulcer in the middle of Charcot deformity on the dorsal aspect  of his right foot.  He has been managed with healing sandals, felt  donut, offloading.  He has not had any fever, pain or infection.   WOUND EXAMINATION:  His left foot is fine.  He is afebrile.  He has no  specific complaints.  He continues to have a linear ulceration in the  middle of the right foot Charcot deformity.  I did a partial-thickness  debridement of this with a #15-blade.  No anesthesia or hemostasis was  necessary.  I removed some callous from both the sides of the small  wound.  Once again, he is not obtaining diabetic foot wear for reasons  that I am not totally clear.  Apparently, another pair has been  reordered.   WOUND CARE PLAN AND FOLLOWUP:  We have, on the right foot, put Aquacel-  AG on the wound, offloaded this with donuts, and wrapped him in a  Profore light.  He is to continue in the healing sandals.  We will see  him again in 1 week.           ______________________________  Maxwell Caul, M.D.     MGR/MEDQ  D:  10/27/2006  T:  10/27/2006  Job:  [id number]

## 2010-10-01 NOTE — Assessment & Plan Note (Signed)
[facility]   NAME:  [name], [name]               ACCOUNT NO.:  [id number]   MEDICAL RECORD NO.:  [id number]      DATE OF BIRTH:  Feb 05, 1927   PHYSICIAN:  Theresia Majors. Tanda Rockers, M.D.      VISIT DATE:                                   OFFICE VISIT   VITAL SIGNS:  Blood pressure 130/69.  Respirations 20.  Pulse rate 83.  Temperature 98.3.   PURPOSE OF TODAY'S VISIT:  [name] is a 77 year old man who we are  following for neuropathic ulcers involving his feet.  In the interim we  treated him with bilateral Profore wraps and customized felt offloading  donuts and heeling sandals.  He is awaiting custom orthotics and braces.  He denies interim pain, excessive malodor or drainage.   WOUND EXAM:  Inspection of the lower extremities shows that there has  been a decrease in his edema.  The stasis change while still present are  less severe.  There is no induration or cellulitis.  Wound #8 of the  left great toe was completely resolved.  Wound #6 of the left second toe  is covered by a dry eschar with absolutely no drainage.  The ulceration  on the right plantar foot has decreased significantly and is now a  linear area with a maximum width of 0.4 to 0.5 cm.  There is no  induration, there is no excessive callous formation.  The pedal pulses  remain bilaterally palpable.   MANAGEMENT PLAN & GOAL:  We will redress the patient utilizing bilateral  Profore Lights with offloading felt donuts, Ultramide cream and  interdigital lamb's wool.  We will reevaluate him in one week.  We will  continue this management until he is effectively fitted for and receives  his custom orthotics.           ______________________________  Theresia Majors Tanda Rockers, M.D.     [name]  D:  07/10/2006  T:  07/10/2006  Job:  [id number]

## 2010-10-01 NOTE — Assessment & Plan Note (Signed)
[facility]   NAME:  QUADARIUS, HENTON NO.:  [id number]   MEDICAL RECORD NO.:  [id number]      DATE OF BIRTH:  1926/10/07   PHYSICIAN:  Maxwell Caul, M.D.      VISIT DATE:                                   OFFICE VISIT   PURPOSE OF TODAY'S VISIT:  Followup of severe bilateral Charcot foot  deformities with bilateral venous stasis.   [name] is a gentleman with severe foot deformities secondary to  Charcot foot bilaterally.  He also has severe bilateral venous stasis  problems.  He has neuropathic changes in his feet.  We have had  tremendous problems with wounds in the past.  However, we have managed  to heal these.  He is due to go to get custom made footwear, however he  does not have the money for his co-pay until the end of the month.  In  the meantime, we have been using healing sandals, lambs wool between his  toes and Profore Lite wraps.   WOUND EXAM:  There are no current wounds.  There is the tremendous  deformity of his feet, however this does not look any different than I  am used to seeing.  He also has venous stasis changes.   TREATMENT:  We are going ahead and continuing the same treatment which  includes Felt Donuts, bilateral healing sandals and Profore Lite wraps.   MANAGEMENT PLAN & GOAL:  He assures me that he will have the $100 after  the end of the month.  We are already getting a lot of money via the  [employer] to help him fund this.  I should note that at some  point, he is going to need compression stockings although we will need  to find a way of getting this over his extremely deformed forefeet.  For  now, I have simply continued the Profore Lite wraps.  He is tolerating  these well.           ______________________________  Maxwell Caul, M.D.     MGR/MEDQ  D:  08/04/2006  T:  08/04/2006  Job:  [id number]

## 2010-10-01 NOTE — Assessment & Plan Note (Signed)
[facility]   NAME:  DOIS, JUARBE NO.:  [id number]   MEDICAL RECORD NO.:  [id number]          DATE OF BIRTH:   PHYSICIAN:  Maxwell Caul, M.D. VISIT DATE:  10/13/2006                                   OFFICE VISIT   PURPOSE OF TODAY'S VISIT:  [name] is a 77 year old man whom we have  been following for several months with bilateral neuropathic ulcers.  These ulcers have occurred in the setting of severe bilateral Charcot  deformities.  Last time we saw him, he had developed a linear ulcer in  the middle of the Charcot deformity of his right foot.  He has been  managed with healing sandals, with modified offloading felt strips.  He  has not had any fever, infection or pain.  There is no drainage noted.  Of note, he does not seem to have his modified footwear which we have  been talking about for some time now, nor does he have graded pressure  stockings.   WOUND EXAMINATION:  Temperature 98.1.  Pulse 84.  Respirations 20.  Blood pressure 137/83.  He continues to have a clean, linear ulcer on  the Charcot deformity of his right foot.  This does not appear to be  infected.  The base of this is well granulated.  He has not been wearing  his graded pressure stockings.  He has bilateral edema present, even on  the left leg.  Significant venous stasis, but no other wounds are noted.   IMPRESSIONS:  1. Largely neuropathic ulcers in his right foot.  We have continued      with the modified felt doughnut offloading in the healing sandal on      the right foot.  He has continued to wear a modified healing sandal      with an insert on the left foot.  I have wrapped him again in      Profore lights.  2. I think [name] has probably been noncompliant with his modified      footwear, and also graded pressure stockings.  At variable times he      that he has these, but currently he does not, but is expecting them      at any moment.  I  thought after our last discussion he had his      modified footwear, but I am less certain about that today.      Optimistically, his feet look fairly good today.  The linear ulcer      on his right foot looked somewhat better than the last time I had      seen this.           ______________________________  Maxwell Caul, M.D.     MGR/MEDQ  D:  10/13/2006  T:  10/13/2006  Job:  [id number]

## 2010-10-01 NOTE — Assessment & Plan Note (Signed)
[facility]   NAME:  [name], [name]               ACCOUNT NO.:  [id number]   MEDICAL RECORD NO.:  [id number]      DATE OF BIRTH:  December 10, 1926   PHYSICIAN:  Theresia Majors. Tanda Rockers, M.D. VISIT DATE:  08/11/2006                                   OFFICE VISIT   VITAL SIGNS:  Blood pressure is 122/72, respirations 18, pulse rate 79,  temperature is 98.1.   PURPOSE OF TODAY'S VISIT:  The patient is a 77 year old man who we have  followed for neuropathic ulcers for several months.  Most recently, he  has been casted for costume shoes and inserts.  These are still  outstanding.  The patient reports that he has had some financial  difficulty.  We have provided him with 80% coverage from the stocking  fund.  He needs a sum of just over 100.00 dollar to proceed with  procuring his orthotics.  In the interim, we have fashioned for him on a  weekly basis Pro-4 wraps and felt donuts to off load his feet.  He  returns reporting that he has had no excessive drainage, malodor, pain  or fever.   WOUND EXAM:  Inspection of his lower extremities shows that there is a  persistence of 2+ edema.  There are chronic changes of stasis.  The  pedal pulses are readily palpable.  Wound #5 on the right plantar  surface of the foot is completely healed.  There are areas of callus but  no debridement or paring is indicated.  Wound #6 on the left second toe  was photographed and entered into the wound expert.  The wound is clean  with healthy appearing granulation and no evidence of ascending  infection.  There is an additional wound on the dorsum of the foot which  is likely related to trauma.  There is no evidence of infection.  There  appears to be a granulation response.   WOUND SINCE LAST VISIT:   CHANGE IN INTERVAL MEDICAL HISTORY:   DIAGNOSIS:  Neuropathic ulcers, improved.   TREATMENT:   ANESTHETIC USED:   TISSUE DEBRIDED:   LEVEL:   CHANGE IN MEDS:   COMPRESSION BANDAGE:   OTHER:   MANAGEMENT PLAN & GOAL:  We are strongly encouraging the patient to  follow through with procurement of his custom orthotics as this is the  greatest benefit to him in regards to staying healed.  He intends to do  so.  In the meantime, we will continue to off load him with the felt  donuts.  We will add Iodosorb to his open wounds.  We will reevaluate  him in one week, hopefully he will get his special foot wear soon.      [name], M.D.  Electronically Signed     HAN/MEDQ  D:  08/11/2006  T:  08/12/2006  Job:  [id number]

## 2010-10-01 NOTE — Assessment & Plan Note (Signed)
[facility]   NAME:  [name], [name]               ACCOUNT NO.:  [id number]   MEDICAL RECORD NO.:  [id number]            DATE OF BIRTH:   PHYSICIAN:  Theresia Majors. Tanda Rockers, M.D. VISIT DATE:  11/01/2006                                   OFFICE VISIT   SUBJECTIVE:  [name] returns for followup of a neuropathic ulcer  involving the plantar surface of his left foot.  The patient has  concurrently been pursuing custom orthotics at [employer].  He has a custom  shoe on order, which should be returned any day.  In the interim, he has  continued to wear the modified healing sandal.  There has been no  excessive drainage, malodor, pain or fever.   OBJECTIVE:  Blood pressure is 116/67, respirations are 18, pulse rate is  66, temperature is 98.3.  Inspection of the left plantar foot shows that  there is an area of callus with a linear fissure.  There is granulation  at the depth of the fissure.  The callus formation is minimal and no  pairing or debridement is needed.  The pedal pulse remains palpable.  There are chronic changes of stasis, associated with 2+ edema.   IMPRESSION:  Neuropathic ulcer stable.  Inadequate offloading, awaiting  custom orthotics.   PLAN:  We will return the patient to an offloading healing sandal with  modification of the felt inserts to offload the area of ulceration.  We  will add a Profore Lite to control his edema and place lambs wool  between the toes.  We will reevaluate the patient in 1 week.      [name], M.D.  Electronically Signed     HAN/MEDQ  D:  11/01/2006  T:  11/01/2006  Job:  [id number]

## 2010-10-01 NOTE — Assessment & Plan Note (Signed)
[facility]   NAME:  [name], [name]               ACCOUNT NO.:  [id number]   MEDICAL RECORD NO.:  [id number]      DATE OF BIRTH:  01/29/1927   PHYSICIAN:  Jake Shark A. Tanda Rockers, M.D. VISIT DATE:  04/11/2006                                     OFFICE VISIT   VITAL SIGNS:  Blood pressure is 128/72, respirations 20, pulse rate 84, and  he is afebrile.   PURPOSE OF TODAY'S VISIT:  [name] is a 77 year old man, who we have  followed for several months to years with bilateral neuropathic ulcerations  of the feet.  Most recently, we saw him for a plantar ulcer on the right  foot and he was concurrently being fitted for custom inserts.  He had  previously been treated for ulcerations on the left foot and was wearing a  custom shoe and insert.  During the interim, he has had progressive drainage  on the right, but no pain or fever.  He has complained of a callus on the  left foot.   WOUND EXAM:  Inspection of the lower extremities shows that the deformities  with his neuropathic feet remain unchanged.  On the right lower extremity,  the ulcer on the volar aspect of the foot is, in fact, decreased in volume.  There is 100% granulating bed with healthy-appearing epithelium advancing  from the circumference.  There is no excessive drainage and no malodor.  On  the left foot, the 2nd toe, there is a new wound composed of proud flesh  that extends to the interphalangeal joint distally.  There is associated  edema and redness.  There is no extreme malodor.  Both lower extremities are  associated with 2+ edema and chronic changes of stasis.   DIAGNOSIS:  Bilateral neuropathic ulcers.  The right ulcer improved.  A new  ulcer on the left foot.   MANAGEMENT PLAN & GOAL:  The patient will continue his custom fittings for  the right shoe.  We are referring him for readjustment of his left shoe to  offload the dorsomedial left 2nd toe.  We have cultured him and started him  empirically on Septra DS 1 p.o. b.i.d.  We will reevaluate him in 1 week  p.r.n.  His dressing for today will be a silver gel dressing over the wounds  with continuation of the felt.  I will not offload the right volar ulcer.  In addition, we will place him in bilateral Profore Lites to reduce the  amount of local edema.           ______________________________  Theresia Majors. Tanda Rockers, M.D.     [name]  D:  04/11/2006  T:  04/11/2006  Job:  [id number]

## 2010-10-01 NOTE — Assessment & Plan Note (Signed)
[facility]   NAME:  [name], Daniel               ACCOUNT NO.:  [id number]   MEDICAL RECORD NO.:  [id number]            DATE OF BIRTH:   PHYSICIAN:  Theresia Majors. Tanda Rockers, M.D. VISIT DATE:  05/15/2006                                   OFFICE VISIT   VITAL SIGNS:  Blood pressure is 136/74, respirations 16, pulse rate 67,  temperature 98.3.   PURPOSE OF TODAY'S VISIT:  [name] is a 77 year old man who is seen  in followup with bilateral neuropathic ulcerations.  In the interim, he  has been treated with bilateral healing sandals and fashioned offloading  felt donut pads.  In the interim, he denies excessive malodor, pain, or  fever.  We used calcium alginate between the first and the second toes  of his left foot.   WOUND EXAM:  Inspection of the lower extremity shows that the right  plantar ulcer has decreased in volume.  The offloading has been  adequate.  Wound #7 has a scab.  Wound #6 of the left second toe and  wound #8 of the left great toe have responded to the calcium alginate.  There is clear advancement of epithelium from the edges.  The granulated  base on the left second toe continues to contract and there is no  evidence of deeper penetration.   DIAGNOSIS:  Clinical improvement.   MANAGEMENT PLAN & GOAL:  We will continue offloading with the healing  sandals and the fashioned felt strips.  We will reevaluate the patient  in 1 week.   ADDENDUM:  All 10 of the patient's grotescly overgrown nails were  trimmed and debrided without difficulty.           ______________________________  Theresia Majors Tanda Rockers, M.D.     [name]  D:  05/15/2006  T:  05/16/2006  Job:  [id number]

## 2010-10-01 NOTE — Assessment & Plan Note (Signed)
[facility]   NAME:  [name], [name]               ACCOUNT NO.:  [id number]   MEDICAL RECORD NO.:  [id number]      DATE OF BIRTH:  [birth year]   PHYSICIAN:  Jake Shark A. Tanda Rockers, M.D. VISIT DATE:  04/25/2006                                   OFFICE VISIT   VITAL SIGNS:  Blood pressure is 140/80, respirations 18, pulse rate 78,  temperature 99.5.   WOUND EXAM:  Wounds #5, 6, and 7 were examined.  Wound #5 of the right  plantar surface is clean with 100% granulation and advancement of  epithelium.  No debridement was necessary.  Wound #6 of the left second  toe shows no evidence of exposed bone.  There is minimum to scant  drainage.  There is absolutely no hyperemia or evidence of infection.  Wound #7 on the left plantar surface has decreased markedly as a result  of adequate offloading.  There is scant drainage, 100% granulation with  advancement of epithelium.  Neither of the 3 wounds required  debridement.   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  He has been treated with  felt donuts used to surround the deformities of his feet and to offload.  He denies interim pain, excessive drainage, malodor, or fever.   DIAGNOSIS:  Improvement of the wounds secondary to adequate offloading.   MANAGEMENT PLAN & GOAL:  We will resume the doughnut offloading with the  felt fabric.  We will reevaluate the patient in 1 week.  We will  continue the UltraMide for skin conditioning.   The patient has been given opportunity to ask questions.  He understands  that if he develops excessive drainage or fever, he is to call for an  interim appointment.           ______________________________  Theresia Majors. Tanda Rockers, M.D.     [name]  D:  04/25/2006  T:  04/26/2006  Job:  [id number]

## 2010-10-01 NOTE — Assessment & Plan Note (Signed)
[facility]   NAME:  [name], [name]               ACCOUNT NO.:  [id number]   MEDICAL RECORD NO.:  [id number]      DATE OF BIRTH:  [birth year]   PHYSICIAN:  Theresia Majors. Tanda Rockers, M.D. VISIT DATE:  01/18/2006                                     OFFICE VISIT   VITAL. SIGNS:  Blood pressure is 104/78, respirations 18, pulse rate 64 and  temperature 98.3.   PURPOSE OF TODAY'S VISIT:  [name] is a 77 year old man who we had been  following neuropathic ulcers on his right foot.  In the interim, he has been  treated with a modified Profore light and off-loading felt donuts.  He  denies pain or excessive drainage or excessive malodor.   WOUND EXAM:  Inspection of the foot shows that there is no intense  hyperemia.  The volar ulcer looks decreased on the outside.  Their  measurements have been taken.  The wound has been photographed and into the  wound expert.  There was a moderate amount of callus and necrotic tissue  around the ulcer.  This was full-thickness debrided with a #10 blade.  Hemorrhage was controlled with silver nitrate.  There was an area of  undermining at 2 o'clock of about 0.5 cm.  There was no excessive malodor  but there was serous drainage.   DIAGNOSIS:  Moderately improved.   MANAGEMENT PLAN & GOAL:  We will continue off loading with the modified felt  donuts.  We will also provide compression with the modified Profore light.  We will reevaluate the patient in one week.           ______________________________  Theresia Majors. Tanda Rockers, M.D.     [name]  D:  01/18/2006  T:  01/18/2006  Job:  [id number]

## 2010-10-01 NOTE — Assessment & Plan Note (Signed)
[facility]   NAME:  [name], [name] NO.:  [id number]   MEDICAL RECORD NO.:  [id number]      DATE OF BIRTH:  [birth year]   PHYSICIAN:  Maxwell Caul, M.D. VISIT DATE:  09/01/2006                                   OFFICE VISIT   PURPOSE OF TODAY'S VISIT:  [name] is a 77 year old man who has been  followed for a neuropathic ulcer secondary to severe Charcot deformities  in the feet.  All of his ulcers today are essentially healed; however,  even the ulcer on the ventral surface of his left second toe is  essentially resolved at this point.  Nevertheless, because of the  severity of his foot deformities, we will continue to follow him until  he obtains his modified footwear and then he can transition into the  modified prescription footwear and his graded-pressure stockings.  To  date, we have been using felt donuts, Ultramide to his legs, Profore  wraps, and his healing sandals.   WOUND EXAM:  He has mild edema, chronic stasis changes; however, the  edema and the stasis changes remain controlled with wraps.  The smaller  ulcer on his left second toe has largely closed over.  No debridements  were done.   DIAGNOSIS:  Severe bilateral foot deformities with Charcot deformities  in his feet together with venous stasis and neuropathic ulcers.   MANAGEMENT PLAN & GOAL:  We have continued him with lamb's wool between  his toes, Ultramide to his legs, donuts, healing sandals, and Profore  wraps until he obtains his footwear.  This will be necessary to continue  or we will have rapid deterioration and recurrence of wounds.           ______________________________  Maxwell Caul, M.D.     MGR/MEDQ  D:  09/01/2006  T:  09/01/2006  Job:  [phone]

## 2010-10-01 NOTE — Assessment & Plan Note (Signed)
[facility]   NAME:  [name], [name]               ACCOUNT NO.:  [id number]   MEDICAL RECORD NO.:  [id number]            DATE OF BIRTH:   PHYSICIAN:  Theresia Majors. Tanda Rockers, M.D. VISIT DATE:  06/22/2006                                   OFFICE VISIT   VITAL SIGNS:  Blood pressure is 130/72, respirations 20, pulse rate 73  and temperature is 97.8.   PURPOSE OF TODAY'S VISIT:  [name] is a 77 year old man who we follow  with neuropathic ulcerations.  In the interim, he has been treated with  felt offloading and serial debridements.  Most recently, he has been  seen by [employer] for fitting for custom shoe inserts and braces.  He  denies excessive drainage, malodor, pain or fever.   WOUND EXAM:  Inspection of the feet shows that wound number 5 continues  to contract.  There is no evidence of excessive necrosis.  A debridement  is not needed.  Wounds number 6 and 8 show continued improvement.  There  is an area of contact dermatitis on the instep of the left foot, which  we are treating with ketoconazole.   DIAGNOSIS:  Stabilization of neuropathic ulcers.  Adequate offloading  protection with the felt doughnuts.   MANAGEMENT PLAN & GOAL:  We will continue to see the patient weekly to  protect against breakdown from pressure while we await his final fitting  for his custom shoes and inserts.           ______________________________  Theresia Majors Tanda Rockers, M.D.     [name]  D:  06/22/2006  T:  06/23/2006  Job:  [id number]

## 2010-10-01 NOTE — Assessment & Plan Note (Signed)
[facility]   NAME:  [name], [name]               ACCOUNT NO.:  [id number]   MEDICAL RECORD NO.:  [id number]      DATE OF BIRTH:  [birth year]   PHYSICIAN:  Theresia Majors. Tanda Rockers, M.D. VISIT DATE:  10/03/2005                                     OFFICE VISIT   SUBJECTIVE:  [name] is a 77 year old man with a neuropathic ulcer  involving his right foot. He has been followed over a long period of time by  the wound center. During the interim, he has worn an Una-wrap with felt off  loading of the volar medial ulcer of the right foot. He denies fever, pain.  His drainage has been moderate.   OBJECTIVE:  VITAL SIGNS:  Stable. He is afebrile.  EXTREMITIES:  Examination of the wound shows a halo of callus, which was  debrided. There was decreasing serous drainage. There was no ascending  inflammation. The 3 2 to 3+ edema remained present.   IMPRESSION:  Slight improvement.   PLAN:  The patient was returned to an Una-wrap with a felt donut for off  loading. We will see him in 1 week.           ______________________________  Theresia Majors. Tanda Rockers, M.D.     [name]  D:  10/03/2005  T:  10/03/2005  Job:  [id number]

## 2010-10-01 NOTE — Assessment & Plan Note (Signed)
[facility]   NAME:  [name], [name]               ACCOUNT NO.:  [id number]   MEDICAL RECORD NO.:  [id number]           DATE OF BIRTH:   PHYSICIAN:  Zebulen Simonis. Sevier, M.D.  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   Since his last visit he reports some itching under the dressing, but  otherwise no particular problems.  He is unaware of any increased drainage,  change in pain, odor, or systemic symptoms.  He has had no change in his  medications.   PHYSICAL EXAMINATION:  VITAL SIGNS:  Blood pressure 128/70, pulse 80 and  regular, respirations 20, temperature 98.1.  EXTREMITIES:  On the plantar aspect of the right foot is a chronic ulcer  measuring 1 x 1.3 x 0.3 cm with minimal undermining at the edges with  minimal drainage and with considerable surrounding callus.  This is largely  unchanged in appearance from before.   IMPRESSION:  Stable chronic ulceration right plantar mid foot.   DISPOSITION:  1.  The area is debrided of some of the surrounding callus and also of the      slight overhang at the margins of the wound.   The wound is then treated with an application of Neosporin, the area  protected by felt donuts as we have been doing chronically and the extremity  placed in a Profore wrap.   It is noted that we had previously used an Unna wrap but he has had some  local reaction to this with irritation of the skin and accordingly the skin  was treated with an application of 1/10% triamcinolone cream prior to  placement of the Profore wrap.  The Profore wrap is, of course, to diminish  the edema which seems to keep this ulcer draining and fosters its  continuation.   Follow-up visit will be  here in 10 days on July 2.           ______________________________  Jonelle Sports. Cheryll Cockayne, M.D.     RES/MEDQ  D:  11/03/2005  T:  11/03/2005  Job:  [id number]

## 2010-10-01 NOTE — Assessment & Plan Note (Signed)
[facility]   NAME:  [name], [name] NO.:  [id number]   MEDICAL RECORD NO.:  [id number]            DATE OF BIRTH:   PHYSICIAN:  Maxwell Caul, M.D. VISIT DATE:  08/18/2006                                   OFFICE VISIT   PURPOSE OF TODAY'S VISIT:  This is a 77 year old man we have been  following for neuropathic ulcers secondary to severe Charcot deformities  in his feet.  He tells me today that he has finally been able to make  the payment for his custom shoes and inserts.  However, he tells me that  the wait time for these to finally to be obtained in somewhere in the 5-  6 week timeframe.  He reports today that he has had no excessive  malodor, pain or fever.  He states his feet look as good as they have in  some time.  We have been continuing to manage his severe venous stasis  with a Profore wraps and felt donuts to offload his feet.  He continues  in a healing sandal.   WOUND EXAM:  Inspection of his lower extremities shows that there is  mild edema.  There is chronic stasis changes; however, these remain  controlled with the wraps.  The surface of his right foot is completely  healed, although there is significant Charcot deformities.  We did not  need to do any debridement today.  Wound number 6, on the left second  toe, appears to be granulating; however, there is no epithelization.  There is no evidence of infection.   DIAGNOSIS:  Severe bilateral foot deformities associated with venous  stasis.   MEDICAL PLAN & GOAL:  To the 1 remaining wound here, we have continued  with Iodosorb gel.  We have continued with lamb's wool between his toes,  Profore wraps and his healing sandals.  As mentioned, he has finally  been approved with either the stocking fund for custom made shoes;  however, this will take a 5-6 week timeframe.  The concern would be that  if we discontinue seeing him that we could develop recurrent  ulcerations.  He  will be reviewed again in 1 week.           ______________________________  Maxwell Caul, M.D.     MGR/MEDQ  D:  08/18/2006  T:  08/18/2006  Job:  [id number]

## 2010-10-01 NOTE — Assessment & Plan Note (Signed)
[facility]   NAME:  [name], [name] NO.:  [id number]   MEDICAL RECORD NO.:  [id number]      DATE OF BIRTH:  12-15-1926   PHYSICIAN:  Maxwell Caul, M.D.      VISIT DATE:                                   OFFICE VISIT   PURPOSE OF TODAY'S VISIT:  Continued followup of severe Charcot  deformities in his feet and severe bilateral venostasis.  We had been  continuing to follow [name] due to delay in obtaining prescription  footwear and a graded pressure stockings.   Unfortunately, he has a recurrent ulcer over the Charcot deformity of  his right foot.  He is not aware of how this happened, although, he, I  think, has spent more time on his feet this week than previously.   WOUND EXAM:  In the middle of the Charcot deformity of the right foot  was a fairly large, but superficial ulceration.  The center of this  wound actually had a deeper recess.  This underwent a full thickness  debridement.  We used a #15 blade.  Hemostasis was achieved with direct  pressure and a Silver Nitrate stick.   WOUND CARE PLAN AND FOLLOWUP:  We have applied an Iodosorb to the deeper  recess of this wound.  I have once again put him in the felt donuts with  his healing sandals and his Profore wraps.  We will see this area on his  right foot again on Tuesday.  I have advised him to keep off his feet as  much as possible, reminding him that if we get a continued amount of  breakdown here, he might ultimately end up in a total contact cast.  He  has promised me to try and keep off his feet as much as possible, until  we can review this early next week.           ______________________________  Maxwell Caul, M.D.     MGR/MEDQ  D:  09/15/2006  T:  09/15/2006  Job:  [id number]

## 2010-10-01 NOTE — Assessment & Plan Note (Signed)
[facility]   NAME:  [name], [name]               ACCOUNT NO.:  [id number]   MEDICAL RECORD NO.:  [id number]      DATE OF BIRTH:  1926/10/12   PHYSICIAN:  Jonelle Sports. Sevier, M.D.  VISIT DATE:  10/26/2005                                     OFFICE VISIT   HISTORY OF PRESENT ILLNESS:  This 77 year old male is followed for chronic  ulceration on the plantar aspect of the right mid foot in associated with a  mid foot breakdown, which though not on a diabetic basis, is very similar to  a Charcot neuropathy type of situation.   The patient reports that there seems to have been perhaps less drainage in  the wound and no significant pain. No other change that he can appreciate.  He has had no fever or systemic symptoms. There has been no change in his  medications.   PHYSICAL EXAMINATION:  VITAL SIGNS:  Blood pressure 150/90, pulse 76 and  regular. Respiratory rate 24. Temperature 98.4.  EXTREMITIES:  The wound on the plantar mid foot on the right grossly appears  somewhat smaller and indeed, shows less drainage than previously. There is  modest mal-odor, which is a chronic finding. The surrounding callus is far  less macerated than has been typical for the appearance of his wound. The  measurements of the wound now are 1.3 x 1.5 cm with a depth of approximately  0.3 cm.   IMPRESSION:  Stable situation with chronic right mid foot ulcer, which is  pressure in nature and which is managed here on a palliative basis.   DISPOSITION:  The wound is debrided partial thickness of some of the callus  around the margins of the wound. The depth of the wound requires no  intervention and the base is nicely granular.   The foot is then dressed with double thickness felt pad donuts to the area  with application of neosporin in the wound itself and that extremity is then  placed in a Una wrap from the base of the toes to the knee, to counter act  the tendency toward edema, which  increases the wound drainage.   Before application of the Una, the leg is coated with 2 Ultra-Mide 25, which  has been effective in combating dryness and also relieving his tendency  toward itching.   Followup visit will be here in 1 week.           ______________________________  Jonelle Sports. Cheryll Cockayne, M.D.     RES/MEDQ  D:  10/26/2005  T:  10/26/2005  Job:  [id number]

## 2010-10-01 NOTE — Assessment & Plan Note (Signed)
[facility]   NAME:  [name], [name]               ACCOUNT NO.:  [id number]   MEDICAL RECORD NO.:  [id number]      DATE OF BIRTH:  02-Nov-1926   PHYSICIAN:  Jake Shark A. Tanda Rockers, M.D.      VISIT DATE:                                     OFFICE VISIT   SUBJECTIVE:  [name] is a 77 year old man who has been followed over a  long period of time with neuropathic ulcers of his feet.  He was recently  discharged from the clinic, and we have given him a prescription for custom  shoes and inserts.  Due to the complexity of the fitting of his feet, he  will require interim posting and off-loading with customized felt strips.  He returns for this purpose.   In the interim, he denies fever or drainage.   OBJECTIVE:  VITAL SIGNS:  Blood pressure 120/80, respirations 18, pulse rate  74.  Is afebrile.  EXTREMITIES:  Inspection of the wound shows there are calluses still  present, but there is no drainage and no open ulceration.  There is a  crevice in the center of the callous, but it does not need debridement, and  there is no evidence of drainage or communication with the subcutaneous  tissue.   ASSESSMENT:  Continued healed wound without breakdown.   PLAN:  We have provided customized felt off-loading to bridge the gap  between the patient's receipt of his customized shoes and his healing.  We  have advised the patient that we would like to see him once he has been  completely fitted.           ______________________________  Theresia Majors Tanda Rockers, M.D.     [name]  D:  02/08/2006  T:  02/09/2006  Job:  [id number]

## 2010-10-01 NOTE — Assessment & Plan Note (Signed)
[facility]   NAME:  [name], [name]               ACCOUNT NO.:  [id number]   MEDICAL RECORD NO.:  [id number]      DATE OF BIRTH:  [birth year]   PHYSICIAN:  Theresia Majors. Tanda Rockers, M.D. VISIT DATE:  12/23/2005                                     OFFICE VISIT   SUBJECTIVE:  [name] returns for follow up of the right neuropathic  ulcer.  During the interim, he reports that he has had less drainage, no  pain, and his swelling is reasonably controlled.   OBJECTIVE:  VITAL SIGNS:  Blood pressure 115/70, respirations 18, pulse rate  80, afebrile.  Examination of the wound shows that there is a moderate amount of maceration  and moisture.  The wound itself has been adequately off loaded with the  circular felt pads.  The wound itself has a healthy appearance without  malodor or suggestion of increased erythema or malodorous drainage.  The  callous has reoccurred minimally.   ASSESSMENT:  Improving wound.   PLAN:  We have elected to use a select silver wicking dressing to promote  stabilization of the periwound environment.  We will resume offloading with  the felt donuts and continue the multilevel compression wrap.  We will  reevaluate the patient in 10 days to 2 weeks.           ______________________________  Theresia Majors Tanda Rockers, M.D.     [name]  D:  12/23/2005  T:  12/23/2005  Job:  [id number]

## 2010-10-01 NOTE — Assessment & Plan Note (Signed)
[facility]   NAME:  [name], [name]               ACCOUNT NO.:  [id number]   MEDICAL RECORD NO.:  [id number]      DATE OF BIRTH:  1927-02-06   PHYSICIAN:  Theresia Majors. Tanda Rockers, M.D. VISIT DATE:  07/19/2006                                   OFFICE VISIT   VITAL SIGNS:  His blood pressure is 136/81, respirations 20, pulse rate  75, temperature 98.   PURPOSE OF TODAY'S VISIT:  [name] is a 77 year old man who we  followed for neuropathic ulcerations involving both of his lower  extremities.  He has been treated with modifications of felt strips to  offload his Charcot deformities.  He has also had multiple parings and  debridements.  During the interim, he reports that there has been no  significant drainage, no pain, no malodor, and no fever.   WOUND EXAM:  Inspection of the lower extremity shows that wounds #5, 6,  8 are completely resolved.  The chronic changes of stasis are persistent  with bilateral 2+ edema.   DIAGNOSIS:  Resolved wounds, persistent stasis changes with persistent  neuropathic potential for breakdown.   MANAGEMENT PLAN & GOAL:  While the patient is awaiting his custom  orthotics, we will continue to place him in the failed donuts to provide  effective offloading.  We will continue the Ultramide cream and the  Profores Lights with interdigital wool to prevent friction ulcerations.  We will reevaluate him in 1 week.  Hopefully, his orthotic appliances  will be delivered shortly.  We have explained this approach to the  patient in terms that he seems to understand.  He expresses gratitude  for having been seen in the clinic and indicates that he will be  compliant as per above.           ______________________________  Theresia Majors. Tanda Rockers, M.D.     [name]  D:  07/19/2006  T:  07/19/2006  Job:  [id number]

## 2010-10-01 NOTE — Assessment & Plan Note (Signed)
[facility]   NAME:  [name], [name]               ACCOUNT NO.:  [id number]   MEDICAL RECORD NO.:  [id number]      DATE OF BIRTH:  06/06/1926   PHYSICIAN:  Theresia Majors. Tanda Rockers, M.D. VISIT DATE:  05/22/2006                                   OFFICE VISIT   VITAL SIGNS:  Blood pressure is 136/77, respirations 20, pulse rate 77  and he is afebrile.   PURPOSE OF TODAY'S VISIT:  [name] is a 77 year old mane who we  follow for neuropathic ulcers and severe deformities of his lower  extremities.  We have treated him with off loading healing sandals with  modified felt doughnuts for off loading.  In the interim, he reports  that there has been no pain, decreased drainage and malodor.  He denies  fever.   WOUND EXAM:  Inspection of his lower extremities shows that all wounds  appear to be markedly improved.  Wound number 7 is completely resolved.  Wound number 8 has a completely 100% granulated base with scant  drainage.  Wounds number 5 and 6 show significant volume reduction with  again healthy granulation of tissue.  There is no evidence of ascending  infection.  There remains chronic changes of stasis with desquamation.   DIAGNOSIS:  Clinical improvement with adequate off loading.   MANAGEMENT PLAN & GOAL:  We will continue the modified healing sandals  with felt doughnuts inserts for off loading.  We will reevaluate the  patient in 10 days.           ______________________________  Theresia Majors Tanda Rockers, M.D.     [name]  D:  05/22/2006  T:  05/23/2006  Job:  [id number]

## 2010-10-01 NOTE — Assessment & Plan Note (Signed)
[facility]   NAME:  GREGOREY, NABOR               ACCOUNT NO.:  [id number]   MEDICAL RECORD NO.:  [id number]      DATE OF BIRTH:  [birth year]   PHYSICIAN:  Jake Shark A. Tanda Rockers, M.D.      VISIT DATE:                                   OFFICE VISIT   SUBJECTIVE:  [name] is a 77 year old man who I have followed for  seven years for bilateral neuropathic ulcerations.  Most recently, we  followed him for a plantar ulceration on the right foot.  In the  interim, he has worn bilateral healing sandals with modified felt  offloading strips in a horseshoe configurations.  He presents for  followup.   There has been no excessive drainage, malodor, or fever.   OBJECTIVE:  Blood pressure is 129/69, respirations 20, pulse rate 72,  temperature 98.  Inspection of the lower extremities shows that there is  1+ edema with chronic changes of stasis.  There are no ulcers on the  left foot, and the dorsalis pedis pulse is readily palpable.  On the  right foot however, there is a linear ulceration on the volar foot  deformity.  This wound is restricted to the subcutaneous area.  There is  healthy-appearing granulation.  There is no evidence of cellulitis or a  ascending lymphangitis.  The dorsalis pedis pulse remains readily  palpable.   IMPRESSION:  Neuropathic ulcer related to difficulties of offloading.   PLAN:  We have redressed the patient's open wound #1 with an Aquacel  Silver dressing directly onto the wound.  We will continue the  modified  healing sandal with the felt horseshoe offloading strips.  We will  continue lamb's wool between the toes, and will continue the modified  healing sandal to the left foot.  The patient will be seen on May 9 at  [facility] for to take receipt of his custom inserts and will also be seen  in the wound center following receipt of the inserts for reevaluation of  his wounds.      [name], M.D.  Electronically Signed     HAN/MEDQ  D:  09/19/2006  T:  09/19/2006  Job:  [id number]

## 2010-10-01 NOTE — Assessment & Plan Note (Signed)
[facility]   NAME:  [name], [name] NO.:  [id number]   MEDICAL RECORD NO.:  [id number]      DATE OF BIRTH:  February 02, 1927   PHYSICIAN:  Maxwell Caul, M.D. VISIT DATE:  01/13/2006                                     OFFICE VISIT   [name] was seen last on January 06, 2006.  He had previously been seen by  Dr. Tanda Rockers with a new infection on his right first toe. He was prescribed  Keflex for staph aureus that was not MRSA. This area is largely resolved.   He also has an area on the volar aspect of his foot which is a neuropathic  ulcer. The patient states this is improving and this seems verified by our  wound care staff.  The base of this ulcer appears clean, it is extensively  surrounded by hyperkeratotic tissue.  I wondered whether there may be more  to this ulcer than we see superficially and I wondered whether some degree  of debridement here may be ultimately necessary. However he wishes not to do  this currently because he has an engagement this weekend.   IMPRESSION:  Neuropathic ulcer. This appears to be clean, it does not  require topical antibiotics. It appears to be granulating. The surrounding  hyperkeratotic skin makes me wonder whether this ultimately may need some  form of debridement.      Maxwell Caul, M.D.     MGR/MEDQ  D:  01/13/2006  T:  01/13/2006  Job:  [id number]

## 2010-10-01 NOTE — Procedures (Signed)
[place]  Patient:    [name], [name]                     MRN: [id number] Proc. Date: 11/05/99 Attending:  Florencia Reasons, M.D. CC:         Shepard [facility], M.D. [facility]                           Procedure Report  PROCEDURE:  Upper endoscopy.  ENDOSCOPIST:  Florencia Reasons, M.D.  INDICATIONS:  Mild iron-deficiency anemia in this 77 year old gentleman.  FINDINGS:  Large hiatal hernia.  Esophageal ring.  DESCRIPTION OF PROCEDURE:  The nature, purpose, risks, and benefits of the procedure were explained to the patient from prior examination, and he had provided written consent.  Sedation was with fentanyl 50 mcg and Versed 5 mg IV to a level of mild sedation without arrhythmias or desaturation.  The Olympus video endoscope was passed under direct vision.  The vocal cords looked normal.  The esophagus was easily entered and had normal mucosa without evidence of reflux esophagitis, Barretts esophagus, varices, infection, or neoplasia.  There was Schatzkis ring at the squamocolumnar junction located at about 37 cm, but, interestingly, the diaphragmatic hiatus seemed to be very inferiorly displaced with roughly a 12 cm hiatal hernia present.  The stomach was entered.  It had a very unusual configuration within the abdomen, but, by pushing the endoscope into the hilt, I was just barely able to traverse the pylorus into a normal-appearing duodenal bulb.  Pullback was then performed. Retroflexed viewing showed no evident lesions in the cardia or the stomach. No gastritis, erosions, ulcers, polyps, or masses were seen.  The scope was removed from the patient who tolerated the procedure well without apparent complication.  IMPRESSION:  Probable large hiatal hernia with unusual anatomic configuration of the stomach within the abdomen.  PLAN:  Consider upper GI series for further clarification.  Large hiatal hernias are able  to account for iron-deficiency anemia. DD:  11/05/99 TD:  11/08/99 Job: [id number]

## 2010-10-01 NOTE — Assessment & Plan Note (Signed)
[facility]   NAME:  HAWLEY, MICHEL               ACCOUNT NO.:  [id number]   MEDICAL RECORD NO.:  [id number]            DATE OF BIRTH:   PHYSICIAN:  Theresia Majors. Tanda Rockers, M.D. VISIT DATE:  04/18/2006                                   OFFICE VISIT   VITAL SIGNS:  Blood pressure is 110/70, pulse rate is 76, respirations  are 20, temperature 98.1.   PURPOSE OF TODAY'S VISIT:  [name] is a 77 year old man with  bilateral neuropathic ulcerations.  In the interim, he has been treated  with felt offloading donuts and healing sandals.  He has had an interim  culture.  He denies excessive pain, malodor, or swelling.   WOUND EXAM:  Inspection of the lower extremities shows that there is  bilateral 2+ edema with chronic changes of stasis.  On the right plantar  foot, the ulcer has decreased significantly.  There is 100% granulated  bib with areas of circumferential necrosis and callus.  A full-thickness  debridement was performed with hemorrhage control with silver nitrate.  Wound #6 of the left second toe is completely granulated with no  evidence of extension into the joint.  Wound #7 on the left plantar  surface is new and is associated with a new blister.  A full-thickness  debridement, including the blister, was performed without difficulty.  Hemorrhage was controlled with silver nitrate.   DIAGNOSIS:  Bilateral neuropathic ulcers.   MANAGEMENT PLAN & GOAL:  We have customed healing sandals, adding felt  donuts to affect offloading.  We will place him in compression wraps and  the healing sandals and reevaluate the patient in 1 week.  We have also  added Ultramide cream for skin conditioning.  Patient has been counseled  regarding the possibility of progression of his ulcers with cellulitis,  pain, or fever.  If these should occur, he is to give the clinic a call  for a priority appointment.  He understands.  He expresses gratitude for  having been seen in the  clinic and indicates that he will be compliant.           ______________________________  Theresia Majors. Tanda Rockers, M.D.     [name]  D:  04/18/2006  T:  04/19/2006  Job:  [id number]

## 2010-10-01 NOTE — Assessment & Plan Note (Signed)
[facility]   NAME:  [name], [name]               ACCOUNT NO.:  [id number]   MEDICAL RECORD NO.:  [id number]      DATE OF BIRTH:  November 19, 1926   PHYSICIAN:  Theresia Majors. Tanda Rockers, M.D. VISIT DATE:  11/23/2005                                     OFFICE VISIT   VITAL SIGNS:  His vital signs are stable.  He is afebrile.   SUBJECTIVE:  [name] returns for a followup of the plantar ulcer of his  right foot.  During the interim he has worn a Profore Lite and off-loading  felt donuts.  He denies fever or excessive drainage or malodor.   OBJECTIVE:  Inspection of the foot shows that there is a persistent  ulceration which has been photographed and entered into the Wound Expert.  Measurements have similarly been taken and entered.  There is minimum  drainage.  There is no malodor.  There is healthy-appearing granulation.  There is minimum reactive callus which did not require debridement.   ASSESSMENT:  Adequate off-loading of a neuropathic ulcer.   MANAGEMENT PLAN & GOAL:  Follow up in 2 weeks.  We will continue the  treatment with a Profore Lite and offloading utilizing felt donuts.           ______________________________  Theresia Majors. Tanda Rockers, M.D.     [name]  D:  11/23/2005  T:  11/23/2005  Job:  [id number]

## 2010-10-01 NOTE — Assessment & Plan Note (Signed)
[facility]   NAME:  [name], [name]               ACCOUNT NO.:  [id number]   MEDICAL RECORD NO.:  [id number]      DATE OF BIRTH:  06/28/1926   PHYSICIAN:  Jake Shark A. Tanda Rockers, M.D. VISIT DATE:  09/29/2006                                   OFFICE VISIT   SUBJECTIVE:  [name] is a 77 year old man who we have followed for  several months with bilateral neuropathic ulcers.  The ulcers on his  left foot have completely resolved.  He is currently in the process of  procuring custom inserts and shoes from [employer].  In the interim, we  have managed him with healing sandals modified with felt offloading  strips and doughnut configured felt.  There has been no fever and no  infection.  He continues to have some mild swelling.   OBJECTIVE:  Blood pressure is 162/79, respirations 18, pulse rate 83,  temperature is 98.6.  Inspection of the lower extremity shows bilateral  1-2+ edema with chronic changes of stasis.  There are no ulcers on the  left pulse.  The pedal pulse remains readily palpable.  On the right  plantar surface, however, there is a linear break with exposure of  granulating tissue.  There is no excessive exudate.  There is no  erythema or cellulitis.   ASSESSMENT:  Improving neuropathic ulcers responding to adequate  offloading.   PLAN:  We have given the patient a prescription for bilateral 30-40 mm  external compression hose.  In addition, he is to continue to wear the  modified offloading healing sandal as per above.  He is to continue the  procurement process for his custom orthotics.  We will re-evaluate him  in 10 days.      [name], M.D.  Electronically Signed     HAN/MEDQ  D:  09/29/2006  T:  09/29/2006  Job:  [id number]

## 2010-10-01 NOTE — Assessment & Plan Note (Signed)
[facility]   NAME:  DEMARRION, MEIKLEJOHN NO.:  [id number]   MEDICAL RECORD NO.:  [id number]      DATE OF BIRTH:  11-Nov-1926   PHYSICIAN:  Maxwell Caul, M.D. VISIT DATE:  09/22/2006                                   OFFICE VISIT   HISTORY:  [name] returns today in followup for his significant  Charcot foot deformities.  Most recently he had a plantar ulceration  over the Charcot deformity of the right foot.  In the interim, he has  obtained his footwear from [employer].  He has his own graded-pressure  stockings.  There has been no excessive drainage, malodor, or fever.   On examination, temperature 98.1, pulse 77, respirations 16, blood  pressure 140/88.  His left foot is free of ulceration.  On the right  foot, in the middle of his Charcot deformity, is this linear wound  measuring 2.7 x 0.4 x 0.2.  There is a superficial aspect of this wound  that is the medial part.  The linear ulceration extends towards the  middle of the Charcot deformity, and there is a deeper aspect of this  wound.  I did a full-thickness debridement using a #15 blade.  No  anesthesia was necessary.  Hemostasis was with direct pressure.  The  areas in question are granulating; however, we are going to need to  continue to offload this area.  There is no evidence of infection.   IMPRESSION:  Recurrent ulceration in the middle of Charcot deformity on  his right foot.  We have applied Aquacel AG to this wound.  We will  continue with donut offloading and the healing sandal on the right foot.  I have advised him to keep off this as much as possible.  As mentioned,  on the left leg, he can wear his own shoe and his own graded-pressure  stocking.  We will see him back on Tuesday or Wednesday of next week in  followup.  I cannot imagine putting him in his shoe on the right foot  until this area has healed.  I have advised him to keep off his feet as  much as   Dictation  ended at this point           ______________________________  Maxwell Caul, M.D.     MGR/MEDQ  D:  09/22/2006  T:  09/22/2006  Job:  [id number]

## 2010-10-01 NOTE — Assessment & Plan Note (Signed)
[facility]   NAME:  [name], [name]               ACCOUNT NO.:  [id number]   MEDICAL RECORD NO.:  [id number]           DATE OF BIRTH:   PHYSICIAN:  Theresia Majors. Tanda Rockers, M.D. VISIT DATE:  01/02/2006                                     OFFICE VISIT   SUBJECTIVE:  [name] returns for a follow-up of cellulitis and a  neuropathic ulcer on the volar aspect of his right foot.  In the interim the  patient has had an abrasive injury to the right great toe.  He has self  treated this with a plastic dressing and merthiolate.   OBJECTIVE:  VITAL SIGNS:  Blood pressure is 136/74, respirations 20, pulse  rate 68 and he is afebrile.  EXTREMITIES:  Examination of the lower extremities shows that there has been  improvement in the edema.  The ulcer itself is clean and dry.  There is no  need for a repeat debridement.  The cellulitis that was previously noted has  receded.  Over the great toe there is hyperemia and ulceration with  maceration related most likely to the plastic dressing.  The ulcer itself is  full thickness and is involved in the medial aspect of the distal phalanx of  the first toe of the right foot.   ASSESSMENT:  Improved cellulitis, clean volar neuropathic ulcer, and a new  neuropathic ulcer of the right great toe.   PLAN:  We will clean the right great toe ulcer with Antisept rinse and apply  a dry dressing.  We will replace the patient in off-loading felt donuts and  an Unna wrap to avoid excessive edema.  We will reevaluate him in four days,  January 06, 2006.  He is to continue his antibiotics.           ______________________________  Theresia Majors Tanda Rockers, M.D.     [name]  D:  01/02/2006  T:  01/02/2006  Job:  [id number]

## 2010-10-01 NOTE — Assessment & Plan Note (Signed)
[facility]   NAME:  GADIEL, JOHN               ACCOUNT NO.:  [id number]   MEDICAL RECORD NO.:  [id number]      DATE OF BIRTH:  [birth year]   PHYSICIAN:  Jake Shark A. Tanda Rockers, M.D. VISIT DATE:  04/03/2006                                     OFFICE VISIT   VITAL SIGNS:  Blood pressure is 125/70, respirations 16, pulse rate 76, and  he is afebrile.   PURPOSE OF TODAY'S VISIT:  [name] returns for followup of an ulceration  on the volar aspect of his right lower extremity.  In the interim, he has  been treated with antibiotics by Dr. __________ , and his culture from his  last visit showed a mixed flora of gram-negative and gram-positive rods.  The patient reports that he has had less erythema, no pain, and no malodor.   WOUND EXAM:  Inspection of the volar aspect of the right plantar foot shows  that there is 2 to 3+ edema with chronic changes of stasis.  The neuropathic  changes remain.  The areas of ulceration are associated with a thick callus.  There are punctate areas of necrosis throughout the area of ulceration.  The  wound was photographed and entered into the wound expert.   DIAGNOSIS:  Recurrent neuropathic ulcers.   TISSUE DEBRIDED:  A full-thickness debridement was achieved without  difficulty with hemorrhage control with silver nitrate.   MANAGEMENT PLAN & GOAL:  We have offloaded the patient with fashioned felt  donuts and applied a multilayer compression wrap to control his venous  hypertension.  He will continue on his antibiotics per Dr. __________ , and  we will reevaluate him in 1 week.  He is to continue his efforts at  procuring custom footwear with offloading braces per [employer].           ______________________________  Theresia Majors. Tanda Rockers, M.D.     [name]  D:  04/03/2006  T:  04/03/2006  Job:  [id number]   cc:   Dr. Gloris Ham

## 2010-10-04 ENCOUNTER — Inpatient Hospital Stay (HOSPITAL_COMMUNITY)
Admission: EM | Admit: 2010-10-04 | Discharge: 2010-10-07 | DRG: 194 | Disposition: A | Discharge status: Home Health | Payer: Medicare Other | Attending: Internal Medicine | Admitting: Internal Medicine

## 2010-10-04 ENCOUNTER — Emergency Department (HOSPITAL_COMMUNITY): Payer: Medicare Other

## 2010-10-04 DIAGNOSIS — Z7901 Long term (current) use of anticoagulants: Secondary | ICD-10-CM

## 2010-10-04 DIAGNOSIS — I10 Essential (primary) hypertension: Secondary | ICD-10-CM | POA: Diagnosis present

## 2010-10-04 DIAGNOSIS — Z22322 Carrier or suspected carrier of Methicillin resistant Staphylococcus aureus: Secondary | ICD-10-CM

## 2010-10-04 DIAGNOSIS — L03119 Cellulitis of unspecified part of limb: Secondary | ICD-10-CM | POA: Diagnosis present

## 2010-10-04 DIAGNOSIS — L02419 Cutaneous abscess of limb, unspecified: Secondary | ICD-10-CM | POA: Diagnosis present

## 2010-10-04 DIAGNOSIS — N400 Enlarged prostate without lower urinary tract symptoms: Secondary | ICD-10-CM | POA: Diagnosis present

## 2010-10-04 DIAGNOSIS — D72829 Elevated white blood cell count, unspecified: Secondary | ICD-10-CM | POA: Diagnosis present

## 2010-10-04 DIAGNOSIS — E871 Hypo-osmolality and hyponatremia: Secondary | ICD-10-CM | POA: Diagnosis present

## 2010-10-04 DIAGNOSIS — J189 Pneumonia, unspecified organism: Principal | ICD-10-CM | POA: Diagnosis present

## 2010-10-04 DIAGNOSIS — K7689 Other specified diseases of liver: Secondary | ICD-10-CM | POA: Diagnosis present

## 2010-10-04 DIAGNOSIS — R197 Diarrhea, unspecified: Secondary | ICD-10-CM | POA: Diagnosis present

## 2010-10-04 DIAGNOSIS — L97809 Non-pressure chronic ulcer of other part of unspecified lower leg with unspecified severity: Secondary | ICD-10-CM | POA: Diagnosis present

## 2010-10-04 DIAGNOSIS — D649 Anemia, unspecified: Secondary | ICD-10-CM | POA: Diagnosis present

## 2010-10-04 DIAGNOSIS — A5211 Tabes dorsalis: Secondary | ICD-10-CM | POA: Diagnosis present

## 2010-10-04 DIAGNOSIS — I872 Venous insufficiency (chronic) (peripheral): Secondary | ICD-10-CM | POA: Diagnosis present

## 2010-10-04 DIAGNOSIS — Z7982 Long term (current) use of aspirin: Secondary | ICD-10-CM

## 2010-10-04 DIAGNOSIS — I4891 Unspecified atrial fibrillation: Secondary | ICD-10-CM | POA: Diagnosis present

## 2010-10-04 LAB — CBC
MCH: 31.1 pg (ref 26.0–34.0)
MCHC: 34.3 g/dL (ref 30.0–36.0)
MCV: 90.5 fL (ref 78.0–100.0)
Platelets: 197 10*3/uL (ref 150–400)
RDW: 13.7 % (ref 11.5–15.5)

## 2010-10-04 LAB — DIFFERENTIAL
Basophils Relative: 0 % (ref 0–1)
Eosinophils Absolute: 0.1 10*3/uL (ref 0.0–0.7)
Eosinophils Relative: 0 % (ref 0–5)
Monocytes Absolute: 0.8 10*3/uL (ref 0.1–1.0)
Monocytes Relative: 3 % (ref 3–12)
Neutrophils Relative %: 94 % — ABNORMAL HIGH (ref 43–77)

## 2010-10-04 LAB — PROTIME-INR: Prothrombin Time: 30.3 seconds — ABNORMAL HIGH (ref 11.6–15.2)

## 2010-10-05 ENCOUNTER — Inpatient Hospital Stay (HOSPITAL_COMMUNITY): Payer: Medicare Other

## 2010-10-05 LAB — URINALYSIS, ROUTINE W REFLEX MICROSCOPIC
Bilirubin Urine: NEGATIVE
Hgb urine dipstick: NEGATIVE
Nitrite: NEGATIVE
Specific Gravity, Urine: 1.015 (ref 1.005–1.030)
Urobilinogen, UA: 0.2 mg/dL (ref 0.0–1.0)
pH: 7 (ref 5.0–8.0)

## 2010-10-05 LAB — MRSA PCR SCREENING: MRSA by PCR: POSITIVE — AB

## 2010-10-05 LAB — COMPREHENSIVE METABOLIC PANEL
ALT: 21 U/L (ref 0–53)
AST: 21 U/L (ref 0–37)
Albumin: 3.2 g/dL — ABNORMAL LOW (ref 3.5–5.2)
Alkaline Phosphatase: 48 U/L (ref 39–117)
Chloride: 94 mEq/L — ABNORMAL LOW (ref 96–112)
GFR calc Af Amer: >60 mL/min (ref >60)
Potassium: 3.9 mEq/L (ref 3.5–5.1)
Sodium: 131 mEq/L — ABNORMAL LOW (ref 135–145)
Total Bilirubin: 0.7 mg/dL (ref 0.3–1.2)
Total Protein: 5.8 g/dL — ABNORMAL LOW (ref 6.0–8.3)

## 2010-10-05 LAB — LIPID PANEL
Cholesterol: 125 mg/dL (ref 0–200)
Total CHOL/HDL Ratio: 1.7 RATIO
VLDL: 6 mg/dL (ref 0–40)

## 2010-10-05 LAB — BASIC METABOLIC PANEL
CO2: 28 mEq/L (ref 19–32)
GFR calc Af Amer: >60 mL/min (ref >60)
Glucose, Bld: 110 mg/dL — ABNORMAL HIGH (ref 70–99)
Potassium: 3.6 mEq/L (ref 3.5–5.1)
Sodium: 131 mEq/L — ABNORMAL LOW (ref 135–145)

## 2010-10-05 LAB — CBC
MCV: 89.9 fL (ref 78.0–100.0)
Platelets: 187 10*3/uL (ref 150–400)
RBC: 3.36 MIL/uL — ABNORMAL LOW (ref 4.22–5.81)
RDW: 14 % (ref 11.5–15.5)
WBC: 27.6 10*3/uL — ABNORMAL HIGH (ref 4.0–10.5)

## 2010-10-05 LAB — LIPASE, BLOOD: Lipase: 15 U/L (ref 11–59)

## 2010-10-05 LAB — HIV ANTIBODY (ROUTINE TESTING W REFLEX): HIV: NONREACTIVE

## 2010-10-05 LAB — MAGNESIUM: Magnesium: 1.6 mg/dL (ref 1.5–2.5)

## 2010-10-06 LAB — CBC
HCT: 31.1 % — ABNORMAL LOW (ref 39.0–52.0)
Hemoglobin: 10.3 g/dL — ABNORMAL LOW (ref 13.0–17.0)
MCV: 90.7 fL (ref 78.0–100.0)
RDW: 13.9 % (ref 11.5–15.5)
WBC: 11.4 10*3/uL — ABNORMAL HIGH (ref 4.0–10.5)

## 2010-10-06 LAB — DIFFERENTIAL
Eosinophils Relative: 1 % (ref 0–5)
Lymphocytes Relative: 8 % — ABNORMAL LOW (ref 12–46)
Lymphs Abs: 1 10*3/uL (ref 0.7–4.0)
Monocytes Absolute: 0.8 10*3/uL (ref 0.1–1.0)
Neutro Abs: 9.6 10*3/uL — ABNORMAL HIGH (ref 1.7–7.7)

## 2010-10-06 LAB — PROTIME-INR: INR: 2.99 — ABNORMAL HIGH (ref 0.00–1.49)

## 2010-10-07 LAB — CBC
HCT: 31.5 % — ABNORMAL LOW (ref 39.0–52.0)
MCH: 30.8 pg (ref 26.0–34.0)
MCHC: 34.3 g/dL (ref 30.0–36.0)
MCV: 89.7 fL (ref 78.0–100.0)
RDW: 13.7 % (ref 11.5–15.5)

## 2010-10-07 LAB — BASIC METABOLIC PANEL
BUN: 16 mg/dL (ref 6–23)
CO2: 28 mEq/L (ref 19–32)
Chloride: 96 mEq/L (ref 96–112)
Creatinine, Ser: 0.68 mg/dL (ref 0.4–1.5)
Glucose, Bld: 96 mg/dL (ref 70–99)
Potassium: 3.9 mEq/L (ref 3.5–5.1)

## 2010-10-07 NOTE — H&P (Signed)
NAME:  [name], [name] NO.:  [id number]  MEDICAL RECORD NO.:  [id number]           PATIENT TYPE:  LOCATION:                                 FACILITY:  PHYSICIAN:  Eduard Clos, MDDATE OF BIRTH:  25-Nov-1926  DATE OF ADMISSION: DATE OF DISCHARGE:                             HISTORY & PHYSICAL   PRIMARY CARE PHYSICIAN:  Dr. Florentina Jenny  CARDIOLOGIST:  Colleen Can. Deborah Chalk, MD  CHIEF COMPLAINT:  Fever and nausea.  HISTORY OF PRESENT ILLNESS:  This is an 77 year old male with known history of atrial fibrillation on Coumadin with recent admission for AFib with RVR and acute bronchitis as well as history of Charcot joint with chronic wound of the bilateral plantar surface of the feet, history of hypertension, BPH, generalized anxiety disorder, last night after dinner started feeling unwell, feverish, and had nausea and one episode of vomiting, after which the patient was brought to the ER, in the ER was found to be febrile with temperature around 100.6 and had leukocytosis, WBC count was around 23,000 and x-ray showing possibility of pneumonia.  The patient has been admitted for further workup.  The patient at this time is not short of breath.  He says he has cough and phlegm chronically.  Denies any chest pain.  Denies any dizziness, loss of consciousness.  Denies any nausea, vomiting at this time but did have one episode before coming.  Denies any abdominal pain, dysuria, or discharges.  He states he has chronic diarrhea at least 3-4 episodes a day which is not new for him.  At this time, the patient has been admitted for further workup for his pneumonia.  The patient does not complain of any pain in the lower extremities.  He says he is being dressed by health care aide, and he states it has been improving.  PAST MEDICAL HISTORY: 1. Atrial fibrillation. 2. BPH. 3. Charcot joint with chronic lower extremity venous stasis ulcers on     the plantar  aspect. 4. Hypertension, 5. Chronic normocytic anemia. 6. History of hiatal hernia. 7. Indeterminate liver lesions, outpatient MRI with contrast     recommended.  MEDICATIONS PRIOR TO ADMISSION:  Hydrochlorothiazide 12.5 mg p.o. daily, losartan 100 mg p.o. daily, multivitamin, polyethylene glycol, atenolol 25 mg p.o. twice daily, docusate sodium 100 mg daily, Bacitracin ointment, aspirin 81 mg daily, Coumadin, Avodart 0.5 mg daily, acetaminophen p.r.n., Systane eyedrops.  ALLERGIES:  ADHESIVE DRAPES.  SOCIAL HISTORY:  The patient lives in assisted living facility.  He is a full code.  Denies smoking cigarettes, drinking alcohol, or using illegal drugs.  FAMILY HISTORY:  Positive for hypertension in both parents.  No coronary artery disease, diabetes, or cancer in the family.  REVIEW OF SYSTEMS:  As per the history of present illness, nothing else significant.  PHYSICAL EXAMINATION:  GENERAL:  The patient was examined at bedside, not in acute distress. VITAL SIGNS:  Blood pressure is 134/70, pulse is around 100-105 per minute, temperature 100.6, respirations 18 per minute, O2 sat 100%. HEENT:  Anicteric.  No pallor.  No discharge from ears, eyes, nose, or mouth. CHEST:  Bilateral air entry present.  Mild bronchial breaths heard in both lower lobes.  No rhonchi, no crepitations. HEART:  S1 and S2 heard. ABDOMEN:  Soft, nontender.  Bowel sounds heard. CENTRAL NERVOUS SYSTEM:  The patient is alert, awake, and oriented to time, place, and person, moves upper and lower extremities 5/5. EXTREMITIES:  There is dressing in the both lower extremities.  His toes are visible, does not show any ischemic changes.  It is deformed.  There are some chronic skin changes in the lower extremities.  At this time, I have not taken off his dressing as the patient says it was just taken off yesterday.  We are going to get a wound consult and we will have a close observation on these chronic wounds  on his plantar aspect of both feet.  LABORATORY DATA:  EKG shows atrial fibrillation with heart rate around 115 beats per minute with nonspecific ST-T changes. X-rays shows stable cardiomegaly without failure, large hiatal hernia contains some ascending colon, new superimposed lower lobe airspace disease suggests pneumonia, likely on the right. CBC:  WBCs 23.7, hemoglobin is 11.5, hematocrit 33.5, platelets 197, neutrophils 94%.  PT/INR is 30.3 and 2.89.  Basic metabolic panel: Sodium 131, potassium 3.6, chloride 94, carbon dioxide 28, glucose 110, BUN 21, creatinine 0.6, calcium 9.3.  CK-MB 1.7, troponin less than 0.05.  UA is negative for nitrites and leukocytes.  ASSESSMENT: 1. Pneumonia. 2. Atrial fibrillation, at this time moderately controlled rate. 3. Chronic diarrhea. 4. History of hypertension. 5. History of Charcot joint in both lower extremity with bilateral     chronic plantar venous stasis ulcers. 6. History of large hiatal hernia. 7. Chronic hyponatremia.  PLAN: 1. At this time, we will admit the patient to telemetry given his     atrial fibrillation to monitor his heart rate. 2. Pneumonia.  At this time, we will treat it as healthcare-associated     pneumonia as the patient was recently in the hospital.  Antibiotics     will be directed per pneumonia antibiotic sheet which includes     vancomycin, cefepime, and Levaquin.  At this time, the patient does     have a significant leukocytosis.  The patient does say he has     chronic diarrhea.  I am going to check stool for C. diff PCR to     rule out any C. diff colitis and also I am going to get x-rays of     his lower extremity to make sure there is no osteomyelitis. 3. Atrial fibrillation.  At this time, rate is around 105, it is     moderately controlled.  It will be mildly high because of his fever     and the stress.  We will closely follow the heart rate.  We will     continue the atenolol at this time and his  Coumadin as per     pharmacy.  If his heart rate goes up significantly, he may need to     be on Cardizem infusion. 4. Nausea.  At this time, not clear what caused the nausea.  The     patient does have indeterminate hepatic lesions     and also left inguinal hernia. At this time, his abdomen looks     benign.  If his nausea persists, we need to get a CAT scan of     abdomen and pelvis. 5. Further recommendation as condition evolves.     Eduard Clos,  MD     ANK/MEDQ  D:  10/05/2010  T:  10/05/2010  Job:  [id number]  cc:   Dr. Neita Garnet. Deborah Chalk, M.D.  Electronically Signed by Midge Minium MD on 10/07/2010 06:31:24 AM

## 2010-10-08 LAB — WOUND CULTURE

## 2010-10-09 NOTE — Discharge Summary (Signed)
NAME:  [name], [name]               ACCOUNT NO.:  [id number]  MEDICAL RECORD NO.:  [id number]           PATIENT TYPE:  I  LOCATION:  2032                         FACILITY:  [facility]  PHYSICIAN:  Conley Canal, MD      DATE OF BIRTH:  02/21/1927  DATE OF ADMISSION:  10/04/2010 DATE OF DISCHARGE:  10/07/2010                        DISCHARGE SUMMARY - REFERRING   PRIMARY CARE PHYSICIAN:  Dr. Florentina Jenny.  CARDIOLOGIST:  Colleen Can. Deborah Chalk, MD  DISCHARGE DIAGNOSES: 1. Healthcare-associated pneumonia. 2. Left leg cellulitis. 3. Leukocytosis secondary to healthcare-associated pneumonia and left     leg cellulitis. 4. Arterial fibrillation on chronic anticoagulation. 5. Charcot ankle joints with chronic wound of the bilateral planus     surface of the feet. 6. Malignant hypertension. 7. Benign prostatic hypertrophy. 8. Hiatal hernia. 9. Generalized anxiety disorder. 10.Chronic normocytic anemia. 11.Indeterminate liver lesion, needs outpatient followup. 12.MRSA positive, nasal swab requiring decolonization.  DISCHARGE MEDICATIONS: 1. Levaquin 750 mg p.o. daily for 8 more days. 2. Mupirocin 2% ointment one application nasally twice daily for 5     more days. 3. Keflex 500 mg 3 times daily. 4. Tylenol 500 mg q. 6 hourly as needed. 5. Aspirin 81 mg daily. 6. Atenolol 25 mg twice daily. 7. Avodart 0.5 mg daily. 8. Bacitracin ointment one application topically 3 times per week. 9. Benadryl OTC one application topically twice daily. 10.Tessalon 100 mg q. 8 hourly as needed. 11.Chloraseptic throat spray 2 sprays every 2 hours as needed. 12.Decubi-Vite 1 capsule daily. 13.Colace 100 mg twice daily. 14.Ferrous sulfate 325 mg twice daily. 15.Hydrochlorothiazide 12.5 mg daily. 16.Losartan 100 mg daily. 17.Mylanta 30 mL every 4 hours as needed. 18.Ocuvite 1 tablet daily. 19.MiraLax 17 g daily. 20.Protein powder one scoop twice daily. 21.Systane Ultra 1 drop in both eyes daily as  needed. 22.Triamcinolone acetonide 0.1% lotion one application topically twice     daily. 23.Ultravate 0.05% one application topically twice daily. 24.Warfarin 6 mg daily.  PROCEDURES PERFORMED:  Chest x-ray, Oct 05, 2010, showed stable cardiomegaly without failure and large hiatal hernia, new superimposed low lobe airspace disease suggesting pneumonia.  HOSPITAL COURSE:  [name] is a very pleasant 77 year old male who was admitted to the hospital on Oct 05, 2010, with complaints of fever and nausea.  He had been discharged from the hospital in April, 2012, for acute bronchitis.  He came back this time with fever and nausea.  He admitted to have cough.  He also had this inflammation of the lower extremities mostly the left leg suggesting cellulitis.  Chest x-ray suggested left lower lobe pneumonia and his white count was 27,000 at the time of admission.  He had 2 sets of blood cultures, which were negative and an MRSA PCR screen was positive.  Wound culture sent on Oct 05, 2010, was growing gram-negative rods and moderate Staphylococcus aureus, this could possibly be due to colonization versus real infection.  He responded dramatically to triple antibiotics with vancomycin, cefepime and Levaquin with a drop in all his white count from 27,000-8.7 on the day of discharge.  The cellulitis on the left leg has shown improvement and  his cough has since resolved.  He will be discharged on Levaquin and Keflex, but he will need close monitoring of his kidney function at discharge since he is at high risk for developing acute kidney injury from other medications including diuretics, ARB and now the added antibiotics which he desperately needs for this severe infection he had.  His INR today is 1.96, however, the INR has been ranging 1.96-2.99, and it will need adjustment by his primary care providers.  He should have the INR checked in the next 3-4 days. Otherwise today, he is  hemodynamically stable, feels much better, and he is discharged in a stable condition.  The time is spent for this discharge preparation is less than 30 minutes.     Conley Canal, MD     SR/MEDQ  D:  10/07/2010  T:  10/07/2010  Job:  [id number]  cc:   Neita Garnet. Deborah Chalk, M.D.  Electronically Signed by Conley Canal  on 10/09/2010 03:53:43 PM

## 2010-10-11 LAB — CULTURE, BLOOD (ROUTINE X 2): Culture  Setup Time: 201205220834

## 2010-10-18 ENCOUNTER — Encounter (HOSPITAL_BASED_OUTPATIENT_CLINIC_OR_DEPARTMENT_OTHER): Payer: Medicare Other | Attending: Internal Medicine

## 2010-10-18 DIAGNOSIS — L97509 Non-pressure chronic ulcer of other part of unspecified foot with unspecified severity: Secondary | ICD-10-CM | POA: Insufficient documentation

## 2010-10-18 DIAGNOSIS — R609 Edema, unspecified: Secondary | ICD-10-CM | POA: Insufficient documentation

## 2010-10-31 ENCOUNTER — Emergency Department (HOSPITAL_COMMUNITY): Payer: Medicare Other

## 2010-10-31 ENCOUNTER — Inpatient Hospital Stay (HOSPITAL_COMMUNITY)
Admission: EM | Admit: 2010-10-31 | Discharge: 2010-11-05 | DRG: 193 | Disposition: A | Discharge status: Home Health | Payer: Medicare Other | Attending: Internal Medicine | Admitting: Internal Medicine

## 2010-10-31 DIAGNOSIS — E43 Unspecified severe protein-calorie malnutrition: Secondary | ICD-10-CM | POA: Diagnosis present

## 2010-10-31 DIAGNOSIS — Z7901 Long term (current) use of anticoagulants: Secondary | ICD-10-CM

## 2010-10-31 DIAGNOSIS — A5211 Tabes dorsalis: Secondary | ICD-10-CM | POA: Diagnosis present

## 2010-10-31 DIAGNOSIS — I10 Essential (primary) hypertension: Secondary | ICD-10-CM | POA: Diagnosis present

## 2010-10-31 DIAGNOSIS — I509 Heart failure, unspecified: Secondary | ICD-10-CM | POA: Diagnosis present

## 2010-10-31 DIAGNOSIS — I5021 Acute systolic (congestive) heart failure: Secondary | ICD-10-CM | POA: Diagnosis present

## 2010-10-31 DIAGNOSIS — N400 Enlarged prostate without lower urinary tract symptoms: Secondary | ICD-10-CM | POA: Diagnosis present

## 2010-10-31 DIAGNOSIS — I872 Venous insufficiency (chronic) (peripheral): Secondary | ICD-10-CM | POA: Diagnosis present

## 2010-10-31 DIAGNOSIS — Z22322 Carrier or suspected carrier of Methicillin resistant Staphylococcus aureus: Secondary | ICD-10-CM

## 2010-10-31 DIAGNOSIS — K219 Gastro-esophageal reflux disease without esophagitis: Secondary | ICD-10-CM | POA: Diagnosis present

## 2010-10-31 DIAGNOSIS — J189 Pneumonia, unspecified organism: Principal | ICD-10-CM | POA: Diagnosis present

## 2010-10-31 DIAGNOSIS — L02419 Cutaneous abscess of limb, unspecified: Secondary | ICD-10-CM | POA: Diagnosis present

## 2010-10-31 DIAGNOSIS — B9689 Other specified bacterial agents as the cause of diseases classified elsewhere: Secondary | ICD-10-CM | POA: Diagnosis present

## 2010-10-31 DIAGNOSIS — I4891 Unspecified atrial fibrillation: Secondary | ICD-10-CM | POA: Diagnosis present

## 2010-10-31 DIAGNOSIS — L97909 Non-pressure chronic ulcer of unspecified part of unspecified lower leg with unspecified severity: Secondary | ICD-10-CM | POA: Diagnosis present

## 2010-10-31 DIAGNOSIS — R7881 Bacteremia: Secondary | ICD-10-CM | POA: Diagnosis present

## 2010-10-31 LAB — DIFFERENTIAL
Basophils Relative: 0 % (ref 0–1)
Eosinophils Absolute: 0 10*3/uL (ref 0.0–0.7)
Lymphs Abs: 0.6 10*3/uL — ABNORMAL LOW (ref 0.7–4.0)
Monocytes Absolute: 0.4 10*3/uL (ref 0.1–1.0)
Monocytes Relative: 2 % — ABNORMAL LOW (ref 3–12)

## 2010-10-31 LAB — BASIC METABOLIC PANEL
BUN: 19 mg/dL (ref 6–23)
Chloride: 93 mEq/L — ABNORMAL LOW (ref 96–112)
Creatinine, Ser: 0.57 mg/dL (ref 0.50–1.35)
GFR calc Af Amer: >60 mL/min (ref >60)
GFR calc non Af Amer: >60 mL/min (ref >60)
Glucose, Bld: 115 mg/dL — ABNORMAL HIGH (ref 70–99)
Potassium: 3.8 mEq/L (ref 3.5–5.1)

## 2010-10-31 LAB — CBC
MCH: 30.6 pg (ref 26.0–34.0)
MCHC: 33.5 g/dL (ref 30.0–36.0)
MCV: 91.3 fL (ref 78.0–100.0)
Platelets: 199 10*3/uL (ref 150–400)

## 2010-10-31 LAB — CK TOTAL AND CKMB (NOT AT ARMC): Relative Index: INVALID (ref 0.0–2.5)

## 2010-10-31 LAB — URINALYSIS, ROUTINE W REFLEX MICROSCOPIC
Bilirubin Urine: NEGATIVE
Ketones, ur: NEGATIVE mg/dL
Nitrite: NEGATIVE
Protein, ur: NEGATIVE mg/dL
Urobilinogen, UA: 0.2 mg/dL (ref 0.0–1.0)

## 2010-10-31 LAB — PROTIME-INR: Prothrombin Time: 26.1 seconds — ABNORMAL HIGH (ref 11.6–15.2)

## 2010-10-31 LAB — TROPONIN I: Troponin I: <0.3 ng/mL (ref <0.30)

## 2010-10-31 LAB — LACTIC ACID, PLASMA: Lactic Acid, Venous: 1.1 mmol/L (ref 0.5–2.2)

## 2010-11-01 LAB — CBC
HCT: 30 % — ABNORMAL LOW (ref 39.0–52.0)
Hemoglobin: 10.1 g/dL — ABNORMAL LOW (ref 13.0–17.0)
MCHC: 33.7 g/dL (ref 30.0–36.0)
MCV: 92 fL (ref 78.0–100.0)
RDW: 13.6 % (ref 11.5–15.5)

## 2010-11-01 LAB — COMPREHENSIVE METABOLIC PANEL
ALT: 23 U/L (ref 0–53)
AST: 19 U/L (ref 0–37)
Alkaline Phosphatase: 45 U/L (ref 39–117)
CO2: 29 mEq/L (ref 19–32)
Calcium: 8.9 mg/dL (ref 8.4–10.5)
GFR calc Af Amer: >60 mL/min (ref >60)
Glucose, Bld: 101 mg/dL — ABNORMAL HIGH (ref 70–99)
Potassium: 4.3 mEq/L (ref 3.5–5.1)
Sodium: 132 mEq/L — ABNORMAL LOW (ref 135–145)
Total Protein: 5.8 g/dL — ABNORMAL LOW (ref 6.0–8.3)

## 2010-11-01 LAB — CK TOTAL AND CKMB (NOT AT ARMC)
CK, MB: 1.7 ng/mL (ref 0.3–4.0)
Relative Index: INVALID (ref 0.0–2.5)
Total CK: 44 U/L (ref 7–232)
Total CK: 64 U/L (ref 7–232)

## 2010-11-01 LAB — TROPONIN I
Troponin I: <0.3 ng/mL (ref <0.30)
Troponin I: <0.3 ng/mL (ref <0.30)

## 2010-11-01 LAB — TSH: TSH: 0.559 u[IU]/mL (ref 0.350–4.500)

## 2010-11-01 LAB — PRO B NATRIURETIC PEPTIDE: Pro B Natriuretic peptide (BNP): 7386 pg/mL — ABNORMAL HIGH (ref 0–450)

## 2010-11-02 LAB — COMPREHENSIVE METABOLIC PANEL
ALT: 17 U/L (ref 0–53)
Albumin: 2.8 g/dL — ABNORMAL LOW (ref 3.5–5.2)
Calcium: 8.6 mg/dL (ref 8.4–10.5)
GFR calc Af Amer: >60 mL/min (ref >60)
Glucose, Bld: 100 mg/dL — ABNORMAL HIGH (ref 70–99)
Potassium: 3.5 mEq/L (ref 3.5–5.1)
Sodium: 129 mEq/L — ABNORMAL LOW (ref 135–145)
Total Protein: 6.1 g/dL (ref 6.0–8.3)

## 2010-11-02 LAB — DIFFERENTIAL
Basophils Absolute: 0 10*3/uL (ref 0.0–0.1)
Basophils Relative: 0 % (ref 0–1)
Eosinophils Absolute: 0.1 10*3/uL (ref 0.0–0.7)
Monocytes Relative: 6 % (ref 3–12)
Neutro Abs: 10 10*3/uL — ABNORMAL HIGH (ref 1.7–7.7)
Neutrophils Relative %: 84 % — ABNORMAL HIGH (ref 43–77)

## 2010-11-02 LAB — MAGNESIUM: Magnesium: 1.7 mg/dL (ref 1.5–2.5)

## 2010-11-02 LAB — PROTIME-INR
INR: 2.34 — ABNORMAL HIGH (ref 0.00–1.49)
Prothrombin Time: 26 seconds — ABNORMAL HIGH (ref 11.6–15.2)

## 2010-11-02 LAB — URINE CULTURE
Colony Count: 7000
Culture  Setup Time: 201206180018

## 2010-11-02 LAB — CBC
Hemoglobin: 10.4 g/dL — ABNORMAL LOW (ref 13.0–17.0)
MCH: 30.2 pg (ref 26.0–34.0)
Platelets: 171 10*3/uL (ref 150–400)
RBC: 3.44 MIL/uL — ABNORMAL LOW (ref 4.22–5.81)
WBC: 11.9 10*3/uL — ABNORMAL HIGH (ref 4.0–10.5)

## 2010-11-03 ENCOUNTER — Inpatient Hospital Stay (HOSPITAL_COMMUNITY): Payer: Medicare Other

## 2010-11-03 LAB — COMPREHENSIVE METABOLIC PANEL
ALT: 23 U/L (ref 0–53)
Alkaline Phosphatase: 49 U/L (ref 39–117)
BUN: 17 mg/dL (ref 6–23)
CO2: 29 mEq/L (ref 19–32)
GFR calc Af Amer: >60 mL/min (ref >60)
GFR calc non Af Amer: >60 mL/min (ref >60)
Glucose, Bld: 101 mg/dL — ABNORMAL HIGH (ref 70–99)
Potassium: 3 mEq/L — ABNORMAL LOW (ref 3.5–5.1)
Sodium: 130 mEq/L — ABNORMAL LOW (ref 135–145)
Total Protein: 6.5 g/dL (ref 6.0–8.3)

## 2010-11-03 LAB — CBC
HCT: 32.3 % — ABNORMAL LOW (ref 39.0–52.0)
MCH: 30.4 pg (ref 26.0–34.0)
MCV: 91 fL (ref 78.0–100.0)
Platelets: 198 10*3/uL (ref 150–400)
RBC: 3.55 MIL/uL — ABNORMAL LOW (ref 4.22–5.81)
RDW: 13.1 % (ref 11.5–15.5)

## 2010-11-03 LAB — DIFFERENTIAL
Eosinophils Absolute: 0.2 10*3/uL (ref 0.0–0.7)
Eosinophils Relative: 2 % (ref 0–5)
Lymphocytes Relative: 16 % (ref 12–46)
Lymphs Abs: 1.4 10*3/uL (ref 0.7–4.0)
Monocytes Relative: 6 % (ref 3–12)

## 2010-11-03 LAB — MAGNESIUM: Magnesium: 1.7 mg/dL (ref 1.5–2.5)

## 2010-11-04 DIAGNOSIS — R609 Edema, unspecified: Secondary | ICD-10-CM

## 2010-11-04 LAB — CULTURE, BLOOD (ROUTINE X 2): Culture  Setup Time: 201206180043

## 2010-11-04 LAB — CBC
MCH: 30 pg (ref 26.0–34.0)
MCHC: 32.9 g/dL (ref 30.0–36.0)
MCV: 91.2 fL (ref 78.0–100.0)
Platelets: 210 10*3/uL (ref 150–400)
RBC: 3.4 MIL/uL — ABNORMAL LOW (ref 4.22–5.81)
RDW: 13.2 % (ref 11.5–15.5)

## 2010-11-04 LAB — BASIC METABOLIC PANEL
BUN: 16 mg/dL (ref 6–23)
Calcium: 7.9 mg/dL — ABNORMAL LOW (ref 8.4–10.5)
Creatinine, Ser: 0.62 mg/dL (ref 0.50–1.35)
GFR calc Af Amer: >60 mL/min (ref >60)
GFR calc non Af Amer: >60 mL/min (ref >60)
Glucose, Bld: 97 mg/dL (ref 70–99)

## 2010-11-04 LAB — PROTIME-INR: Prothrombin Time: 24.5 seconds — ABNORMAL HIGH (ref 11.6–15.2)

## 2010-11-05 LAB — BASIC METABOLIC PANEL
CO2: 30 mEq/L (ref 19–32)
Calcium: 8.2 mg/dL — ABNORMAL LOW (ref 8.4–10.5)
Creatinine, Ser: 0.64 mg/dL (ref 0.50–1.35)
GFR calc non Af Amer: >60 mL/min (ref >60)
Sodium: 133 mEq/L — ABNORMAL LOW (ref 135–145)

## 2010-11-05 LAB — CBC
MCH: 30.6 pg (ref 26.0–34.0)
MCHC: 33 g/dL (ref 30.0–36.0)
MCV: 92.5 fL (ref 78.0–100.0)
Platelets: 240 10*3/uL (ref 150–400)
RBC: 3.6 MIL/uL — ABNORMAL LOW (ref 4.22–5.81)
RDW: 13.3 % (ref 11.5–15.5)

## 2010-11-05 LAB — PROTIME-INR: Prothrombin Time: 25.8 seconds — ABNORMAL HIGH (ref 11.6–15.2)

## 2010-11-14 NOTE — H&P (Signed)
[name], [name]               ACCOUNT NO.:  [id number]  MEDICAL RECORD NO.:  [id number]  LOCATION:  1426                         FACILITY:  [facility]  PHYSICIAN:  Lonia Blood, M.D.      DATE OF BIRTH:  [birth year]  DATE OF ADMISSION:  10/31/2010 DATE OF DISCHARGE:                             HISTORY & PHYSICAL   PRIMARY CARE PHYSICIAN:  Florentina Jenny, MD  PRESENTING COMPLAINT:  Lower extremity swelling.  HISTORY OF PRESENT ILLNESS:  The patient is an 77 year old gentleman with a known history of atrial fibrillation among other things and bilateral Charcot joints.  The patient was then admitted back on Oct 04, 2010, to Oct 05, 2010, with bilateral lower extremity cellulitis, left more than right, as well as hospital-acquired pneumonia.  He went to assisted living facility and is back today with worsening swelling, tenderness, and redness of his left lower extremity compared to the right.  When he was discharged, he was on Levaquin and Keflex for 8 more days, which he seemed to have completed, but apparently his symptoms have returned since.  He had a fever of 103 in the emergency room and is slightly confused at the moment.  There is also questionable shortness of breath, family were worried that he may still have pneumonia and his chest x-ray showed bilateral organizing infiltrates.  The patient is a poor historian and not able to give much history, so much history is obtained from his nursing home records.  PAST MEDICAL HISTORY: 1. History of hospital-acquired pneumonia. 2. Bilateral cellulitis, left more than right. 3. Atrial fibrillation, currently on Coumadin. 4. Hypertension. 5. BPH. 6. GERD. 7. Generalized anxiety disorder. 8. Chronic normocytic anemia. 9. History of MRSA colonization. 10.Bilateral Charcot joints. 11.Bilateral venous stasis ulcers. 12.History of indeterminate liver lesions.  ALLERGIES:  ADHESIVE TAPE.  CURRENT MEDICATIONS: 1. Bacitracin 500 units  ointment, applying to feet, 3 times a day. 2. Avodart 0.5 mg daily. 3. Decubi-Vite 400/50/500 one capsule daily. 4. Hydrochlorothiazide 12.5 mg daily. 5. Losartan 100 mg daily. 6. Ocuvite 1 tablet daily. 7. MiraLax 17 g in 8 ounces of water daily. 8. Atenolol 25 mg twice daily. 9. Benadryl cream applied to affected areas twice daily. 10.Colace 100 mg daily. 11.Ferrous sulfate 325 mg daily. 12.Protein powder 1 cup daily. 13.Mylanta 30 mL 4 hours p.r.n. for indigestion. 14.Benzonatate 100 mg every 8 hours p.r.n. 15.Tylenol Extra Strength 500 mg p.r.n. 16.Coumadin.  SOCIAL HISTORY:  The patient currently lives in an assisted living facility.  Denied tobacco, alcohol, or IV drug use.  He is a full code. He is able to do most of his ADLs normally, but now he needs a lot of assistance.  FAMILY HISTORY:  Significant for hypertension in both parents.  Denied any cancer or coronary artery disease in his family.  REVIEW OF SYSTEMS:  All systems reviewed are negative except per HPI.  PHYSICAL EXAMINATION:  VITAL SIGNS:  Temperature was 103, blood pressure 123/70 with pulse 82, respiratory rate 14, his sats 95% on room air. GENERAL:  The patient is slightly confused, alert, oriented, but in no acute distress. HEENT:  PERRL.  EOMI.  He is bearded.  No rhinorrhea. NECK:  Supple.  No JVD.  No lymphadenopathy. RESPIRATORY:  Good air entry bilaterally with mild decrease in air entry at the bases bilaterally.  No wheezes.  No rales. CARDIOVASCULAR:  Irregularly irregular. ABDOMEN:  Soft, full, nontender with positive bowel sound. EXTREMITIES:  Bilateral lower extremity stasis dermatitis with left lower extremity is swollen, tender, red, warm to touch, more than the right side.  The patient has bilateral ulcers in the base of the foot around the heel, looks clean, no evidence of pores.  The patient also has Charcot joints in both feet bilaterally. SKIN:  As described above. NEURO:   Nonfocal.  LABORATORY DATA:  White count is 16,000 with left shift 15.0, hemoglobin 11.2 with platelet count of 199.  His PT 26.1, INR 2.35.  Sodium 129, potassium 3.8, chloride is 93, CO2 of 26, glucose 115, BUN 19, creatinine 0.57 with a calcium of 9.3, CK 68 with an MB of 2.7, troponin less than 0.33, lactic acid is 1.1.  His urinalysis is essentially negative.  MRSA screening nasal screen is positive.  His chest x-ray shows worsening aeration to both lower lobes with cardiac enlargement and hiatal hernia.  EKG showed atrial few fibrillation with some rapid ventricular response, but currently converted.  Also, evidence of right incomplete right bundle-branch block and left anterior fascicular block.  ASSESSMENT:  This is an 77 year old gentleman with multiple medical problems who is here with what appears to be recurrent left lower extremity cellulitis with bilateral venous stasis dermatitis as well as probably early healthcare-associated pneumonia.  PLAN: 1. Left lower extremity cellulitis.  The patient will be admitted     again with high fever and leukocytosis, I will restart him on     vancomycin and Zosyn.  We will get blood cultures and hopefully be     able to get his cellulitis under control this time around.  He was     on Levaquin, Keflex the last time, these may not have works, maybe     he has some MRSA infection.  We will get wound care consult also. 2. Healthcare-associated pneumonia with the patient's recent pneumonia     and what appears to be worsening lung infiltrates, we will get     broad-spectrum coverage for possible healthcare-associated     pneumonia.  He is currently on vancomycin and Zosyn, I will switch     Zosyn to Primaxin in line with the new pneumonia treatment     guidelines. 3. Atrial fibrillation.  We will put him on tele.  Continue with     Coumadin per Pharmacy and continue his rate control.  He is     currently on beta-blockers and will  continue with that. 4. Hypertension, seems controlled.  We will continue his home     medicine. 5. BPH.  Again, we will continue with home medications. 6. GERD.  We will continue his PPI. 7. Generalized anxiety disorder.  Continue with home medicines. 8. Chronic venous stasis ulcers.  We will get wound care consult to     see the patient in the morning. 9. Bilateral Charcot joints.  Again, this is chronic and the patient     seems stable at this point.  Further treatment will depend on the patient's response to our measures and hopefully, we will be able to get him back to assisted living facility if not will get him to high-level care.     Lonia Blood, M.D.     LG/MEDQ  D:  10/31/2010  T:  11/01/2010  Job:  [id number]  Electronically Signed by Lonia Blood M.D. on 11/14/2010 12:36:43 AM

## 2010-11-16 NOTE — Discharge Summary (Signed)
[name], [name]               ACCOUNT NO.:  [id number]  MEDICAL RECORD NO.:  [id number]  LOCATION:  1426                         FACILITY:  [facility]  PHYSICIAN:  Erick Blinks, MD     DATE OF BIRTH:  1927-04-01  DATE OF ADMISSION:  10/31/2010 DATE OF DISCHARGE:                        DISCHARGE SUMMARY - REFERRING   DISCHARGE DIAGNOSES: 1. Left lower extremity edema secondary to cellulitis plus venous     stasis. 2. Healthcare-associated pneumonia. 3. Gram-negative bacteremia with causative organism being alcaligenes. 4. Acute systolic dysfunction in the setting of underlying infection. 5. History of atrial fibrillation, on anticoagulation. 6. Severe protein-calorie malnutrition. 7. Gastroesophageal reflux disease. 8. Hypertension. 9. Bilateral Charcot feet, for outpatient followup. 10.Benign prostatic hypertrophy.  DISCHARGE MEDICATIONS: 1. Protonix 40 mg p.o. daily. 2. Coreg 25 mg p.o. b.i.d. 3. Lasix 40 mg p.o. daily. 4. Aspirin 81 mg p.o. daily. 5. Colace 100 mg p.o. b.i.d. 6. Ferrous sulfate 325 mg p.o. b.i.d. 7. Losartan 100 mg p.o. daily. 8. Mylanta 30 mL p.o. q.4 h p.r.n. 9. Ocuvite 1 tablet p.o. daily. 10.Bacitracin ointment one application topical three times weekly. 11.Avodart 0.5 mg 1 capsule p.o. daily. 12.Decubi-Vite 400/50/500 one capsule p.o. daily. 13.MiraLax 17 g p.o. daily. 14.Protein powder 1 scoop p.o. b.i.d. 15.Triamcinolone acetamide 0.1% lotion/Cetaphil 2:1 lotion, one     application topical b.i.d. 16.Chloraseptic throat spray 2 sprays p.o. q.2 h p.r.n. 17.Systane Ultra lubricant eyedrops 1 drop both eyes daily p.r.n. 18.Coumadin 6 mg p.o. daily. 19.Tessalon 100 mg p.o. q.8 h p.r.n. 20.Benadryl cream over-the-counter one application topical b.i.d.     p.r.n. 21.Tylenol Extra Strength 1 tablet p.o. q.6 h p.r.n. 22.Ciprofloxacin 500 mg p.o. b.i.d. for 8 more days. 23.Doxycycline 100 mg p.o. b.i.d. for 8 more days.  ADMISSION HISTORY:  This  is an 77 year old gentleman who has a history of atrial fibrillation and bilateral Charcot joints.  He presents to the emergency room with lower extremity swelling and was felt to have a possible cellulitis.  He had a fever in the emergency room of 103 and was slightly confused at that time.  X-ray showed possible pneumonia and he was subsequently admitted for further treatment.  For details, please refer to the history and physical dictated by Dr. Mikeal Hawthorne on October 31, 2010. 1. Cellulitis.  The patient was found to have left lower extremity     cellulitis.  He appears to have chronic venous stasis changes in     both lower extremities.  Dopplers were done of left lower extremity     which were negative for any DVT.  He was started empirically on     vancomycin as well as started on Primaxin.  He tolerated this well     and has since improved.  He is no longer febrile and had a normal     WBC count. 2. Pneumonia.  Again, the patient has improved in this matter.  He is     not having shortness of breath, has minimal cough and is currently    improved. 3. Venous stasis.  The patient was found to have significant venous     stasis.  He was started on some Lasix and was recommended  to keep     his leg elevated.  This has significantly improved with the redness     as well as the edema in his lower extremities.  Due to his edema,     brain natriuretic peptide was checked and was found to be elevated     at 7386.  The patient was started on Lasix at that time and had     shown some improvement.  Repeat echocardiogram shows ejection     fraction of 40-45% with diffuse hypokinesis.  This was compared to     an echocardiogram done in April which showed an ejection fraction     of 60-65%.  The patient's case was discussed with cardiologist Dr.     Sharyn Lull and it was felt that his depression in systolic function     was likely secondary to his underlying bacteremia and underlying     infection.  Since  the patient does not have any chest pain or any     symptoms at this time and has improved and is doing well, it was     felt appropriate to have the patient have followup with Dr. Sharyn Lull     as an outpatient in the next 1 to 2 weeks where further workup if     felt necessary can be conducted at that time.  The patient had     negative cardiac enzymes since he has been here in the hospital and     again has improved with current therapy. 4. Bacteremia.  The patient was found to have gram-negative bacteremia     with alcaligenes.  He has been on ciprofloxacin and has since been     afebrile and we will continue a total of 2 weeks of antibiotics. 5. Severe protein-calorie malnutrition.  The patient was seen by the     nutrition and we have optimized his p.o. intake.  He should be     continued on Ensure shakes and he should remain compliant with his     a heart-healthy  CONSULTATION:  Telephone consult with Dr. Sharyn Lull  DIAGNOSTIC IMAGING: 1. Chest x-ray from admission shows worsening aeration of both lower     lobe, stable cardiac enlargement and hiatal hernia. 2. Chest x-ray from November 03, 2010, shows enlargement of cardiac    silhouette and pulmonary venous hypertension, emphysematous changes     with bibasilar atelectasis, large hiatal hernia 3. 2-D cardiogram on June 21 shows ejection fraction of 40-45%, mild     diffuse hypokinesis.  DISCHARGE INSTRUCTIONS:  The patient should follow up with his primary care physician at the facility.  He will be set up with home services as well as physical therapy and a rolling walker.  He will see Dr. Sharyn Lull in the office in the next 1 to 2 weeks for further cardiac testing if felt appropriate.  He [facility] with [facility] for further treatment of his feet bilaterally.  He was seen by the Wound Care Service here in the hospital and felt that his wounds were stable and did not require any further treatment.  Other than the  current local wound care he is receiving, he will need to follow up with the Wound Care Service for further treatment.  He should continue on a heart-healthy diet, conduct activity as tolerated.  CONDITION AT TIME OF DISCHARGE:  Improved.     Erick Blinks, MD     JM/MEDQ  D:  11/05/2010  T:  11/05/2010  Job:  [id number]  cc:   Florentina Jenny, MD Fax: [phone]  Electronically Signed by Erick Blinks  on 11/16/2010 06:01:05 PM

## 2011-01-24 ENCOUNTER — Encounter (HOSPITAL_BASED_OUTPATIENT_CLINIC_OR_DEPARTMENT_OTHER): Payer: Medicare Other | Attending: Internal Medicine

## 2011-01-24 DIAGNOSIS — I10 Essential (primary) hypertension: Secondary | ICD-10-CM | POA: Insufficient documentation

## 2011-01-24 DIAGNOSIS — E119 Type 2 diabetes mellitus without complications: Secondary | ICD-10-CM | POA: Insufficient documentation

## 2011-01-24 DIAGNOSIS — I739 Peripheral vascular disease, unspecified: Secondary | ICD-10-CM | POA: Insufficient documentation

## 2011-01-24 DIAGNOSIS — L89609 Pressure ulcer of unspecified heel, unspecified stage: Secondary | ICD-10-CM | POA: Insufficient documentation

## 2011-01-24 DIAGNOSIS — L89899 Pressure ulcer of other site, unspecified stage: Secondary | ICD-10-CM | POA: Insufficient documentation

## 2011-01-24 DIAGNOSIS — Z79899 Other long term (current) drug therapy: Secondary | ICD-10-CM | POA: Insufficient documentation

## 2011-01-24 DIAGNOSIS — L8990 Pressure ulcer of unspecified site, unspecified stage: Secondary | ICD-10-CM | POA: Insufficient documentation

## 2011-02-21 ENCOUNTER — Encounter (HOSPITAL_BASED_OUTPATIENT_CLINIC_OR_DEPARTMENT_OTHER): Payer: Medicare Other | Attending: Internal Medicine

## 2011-02-21 DIAGNOSIS — I10 Essential (primary) hypertension: Secondary | ICD-10-CM | POA: Insufficient documentation

## 2011-02-21 DIAGNOSIS — I739 Peripheral vascular disease, unspecified: Secondary | ICD-10-CM | POA: Insufficient documentation

## 2011-02-21 DIAGNOSIS — Z79899 Other long term (current) drug therapy: Secondary | ICD-10-CM | POA: Insufficient documentation

## 2011-02-21 DIAGNOSIS — L8990 Pressure ulcer of unspecified site, unspecified stage: Secondary | ICD-10-CM | POA: Insufficient documentation

## 2011-02-21 DIAGNOSIS — L89899 Pressure ulcer of other site, unspecified stage: Secondary | ICD-10-CM | POA: Insufficient documentation

## 2011-02-21 DIAGNOSIS — L89609 Pressure ulcer of unspecified heel, unspecified stage: Secondary | ICD-10-CM | POA: Insufficient documentation

## 2011-02-21 DIAGNOSIS — E119 Type 2 diabetes mellitus without complications: Secondary | ICD-10-CM | POA: Insufficient documentation

## 2011-02-25 LAB — FOLATE: Folate: 19.2

## 2011-02-25 LAB — DIFFERENTIAL
Basophils Absolute: 0
Basophils Relative: 1
Eosinophils Absolute: 0.3
Neutro Abs: 5.3
Neutrophils Relative %: 70

## 2011-02-25 LAB — CBC
HCT: 29.6 — ABNORMAL LOW
HCT: 30.2 — ABNORMAL LOW
HCT: 32.2 — ABNORMAL LOW
Hemoglobin: 10 — ABNORMAL LOW
Hemoglobin: 10.3 — ABNORMAL LOW
Hemoglobin: 11 — ABNORMAL LOW
Hemoglobin: 11.4 — ABNORMAL LOW
MCHC: 33.8
MCHC: 33.8
MCHC: 34.2
MCV: 88.8
MCV: 89.6
RBC: 3.34 — ABNORMAL LOW
RBC: 3.42 — ABNORMAL LOW
RBC: 3.6 — ABNORMAL LOW
RBC: 3.66 — ABNORMAL LOW
RBC: 3.82 — ABNORMAL LOW
RDW: 12.7
RDW: 12.7
WBC: 7.2

## 2011-02-25 LAB — MAGNESIUM: Magnesium: 2.2

## 2011-02-25 LAB — CLOSTRIDIUM DIFFICILE EIA: C difficile Toxins A+B, EIA: NEGATIVE

## 2011-02-25 LAB — CULTURE, ROUTINE-ABSCESS: Gram Stain: NONE SEEN

## 2011-02-25 LAB — BASIC METABOLIC PANEL
CO2: 27
CO2: 27
CO2: 28
Calcium: 8.8
Calcium: 8.9
Chloride: 102
Chloride: 103
Creatinine, Ser: 0.85
Creatinine, Ser: 0.86
Creatinine, Ser: 0.88
GFR calc Af Amer: >60
GFR calc Af Amer: >60
GFR calc Af Amer: >60
GFR calc Af Amer: >60
GFR calc non Af Amer: >60
GFR calc non Af Amer: >60
Glucose, Bld: 118 — ABNORMAL HIGH
Glucose, Bld: 98
Potassium: 4.4
Potassium: 5.1
Sodium: 135
Sodium: 136

## 2011-02-25 LAB — OCCULT BLOOD X 1 CARD TO LAB, STOOL: Fecal Occult Bld: NEGATIVE

## 2011-02-25 LAB — WOUND CULTURE

## 2011-02-25 LAB — SEDIMENTATION RATE: Sed Rate: 53 — ABNORMAL HIGH

## 2011-02-25 LAB — OVA AND PARASITE EXAMINATION

## 2011-02-25 LAB — CULTURE, BLOOD (ROUTINE X 2): Culture: NO GROWTH

## 2011-02-25 LAB — COMPREHENSIVE METABOLIC PANEL
ALT: 28
Alkaline Phosphatase: 56
BUN: 10
CO2: 26
GFR calc non Af Amer: >60
Glucose, Bld: 114 — ABNORMAL HIGH
Potassium: 3.2 — ABNORMAL LOW
Sodium: 134 — ABNORMAL LOW

## 2011-02-25 LAB — C-REACTIVE PROTEIN: CRP: 3.3 — ABNORMAL HIGH (ref <0.6)

## 2011-02-25 LAB — ANAEROBIC CULTURE

## 2011-02-25 LAB — RETICULOCYTES: Retic Ct Pct: 1.3

## 2011-02-25 LAB — FERRITIN: Ferritin: 436 — ABNORMAL HIGH (ref 22–322)

## 2011-02-25 LAB — BODY FLUID CULTURE

## 2011-02-25 LAB — IRON AND TIBC: UIBC: 186

## 2011-02-26 ENCOUNTER — Emergency Department (HOSPITAL_COMMUNITY)
Admission: EM | Admit: 2011-02-26 | Discharge: 2011-02-27 | Disposition: A | Discharge status: Other Acute Inpatient Hospital | Payer: Medicare Other | Source: Home / Self Care | Attending: Emergency Medicine | Admitting: Emergency Medicine

## 2011-02-26 ENCOUNTER — Emergency Department (HOSPITAL_COMMUNITY): Payer: Medicare Other

## 2011-02-26 LAB — CBC
HCT: 33.9 % — ABNORMAL LOW (ref 39.0–52.0)
MCH: 30.6 pg (ref 26.0–34.0)
MCHC: 33.3 g/dL (ref 30.0–36.0)
MCV: 91.9 fL (ref 78.0–100.0)
Platelets: 208 10*3/uL (ref 150–400)
RDW: 13.1 % (ref 11.5–15.5)

## 2011-02-26 LAB — URINALYSIS, ROUTINE W REFLEX MICROSCOPIC
Bilirubin Urine: NEGATIVE
Hgb urine dipstick: NEGATIVE
Ketones, ur: NEGATIVE mg/dL
Specific Gravity, Urine: 1.011 (ref 1.005–1.030)
Urobilinogen, UA: 0.2 mg/dL (ref 0.0–1.0)
pH: 6.5 (ref 5.0–8.0)

## 2011-02-26 LAB — BASIC METABOLIC PANEL
BUN: 20 mg/dL (ref 6–23)
CO2: 28 mEq/L (ref 19–32)
Calcium: 8.6 mg/dL (ref 8.4–10.5)
GFR calc non Af Amer: 86 mL/min — ABNORMAL LOW (ref >90)
Glucose, Bld: 102 mg/dL — ABNORMAL HIGH (ref 70–99)

## 2011-02-26 LAB — DIFFERENTIAL
Eosinophils Absolute: 0.2 10*3/uL (ref 0.0–0.7)
Eosinophils Relative: 2 % (ref 0–5)
Lymphs Abs: 1.4 10*3/uL (ref 0.7–4.0)
Monocytes Absolute: 0.6 10*3/uL (ref 0.1–1.0)
Monocytes Relative: 8 % (ref 3–12)

## 2011-02-26 LAB — POCT I-STAT TROPONIN I: Troponin i, poc: 0.01 ng/mL (ref 0.00–0.08)

## 2011-02-27 ENCOUNTER — Inpatient Hospital Stay (HOSPITAL_COMMUNITY)
Admission: EM | Admit: 2011-02-27 | Discharge: 2011-02-28 | DRG: 292 | Disposition: A | Discharge status: Home / Self Care | Payer: Medicare Other | Source: Ambulatory Visit | Attending: Internal Medicine | Admitting: Internal Medicine

## 2011-02-27 DIAGNOSIS — I059 Rheumatic mitral valve disease, unspecified: Secondary | ICD-10-CM | POA: Diagnosis present

## 2011-02-27 DIAGNOSIS — Z7901 Long term (current) use of anticoagulants: Secondary | ICD-10-CM

## 2011-02-27 DIAGNOSIS — N400 Enlarged prostate without lower urinary tract symptoms: Secondary | ICD-10-CM | POA: Diagnosis present

## 2011-02-27 DIAGNOSIS — E119 Type 2 diabetes mellitus without complications: Secondary | ICD-10-CM | POA: Diagnosis present

## 2011-02-27 DIAGNOSIS — I739 Peripheral vascular disease, unspecified: Secondary | ICD-10-CM | POA: Diagnosis present

## 2011-02-27 DIAGNOSIS — F411 Generalized anxiety disorder: Secondary | ICD-10-CM | POA: Diagnosis present

## 2011-02-27 DIAGNOSIS — K7689 Other specified diseases of liver: Secondary | ICD-10-CM | POA: Diagnosis present

## 2011-02-27 DIAGNOSIS — K219 Gastro-esophageal reflux disease without esophagitis: Secondary | ICD-10-CM | POA: Diagnosis present

## 2011-02-27 DIAGNOSIS — K449 Diaphragmatic hernia without obstruction or gangrene: Secondary | ICD-10-CM | POA: Diagnosis present

## 2011-02-27 DIAGNOSIS — Z7982 Long term (current) use of aspirin: Secondary | ICD-10-CM

## 2011-02-27 DIAGNOSIS — F410 Panic disorder [episodic paroxysmal anxiety] without agoraphobia: Secondary | ICD-10-CM | POA: Diagnosis present

## 2011-02-27 DIAGNOSIS — L97509 Non-pressure chronic ulcer of other part of unspecified foot with unspecified severity: Secondary | ICD-10-CM | POA: Diagnosis present

## 2011-02-27 DIAGNOSIS — I359 Nonrheumatic aortic valve disorder, unspecified: Secondary | ICD-10-CM | POA: Diagnosis present

## 2011-02-27 DIAGNOSIS — I509 Heart failure, unspecified: Secondary | ICD-10-CM | POA: Diagnosis present

## 2011-02-27 DIAGNOSIS — E46 Unspecified protein-calorie malnutrition: Secondary | ICD-10-CM | POA: Diagnosis present

## 2011-02-27 DIAGNOSIS — I4891 Unspecified atrial fibrillation: Secondary | ICD-10-CM | POA: Diagnosis present

## 2011-02-27 DIAGNOSIS — I5021 Acute systolic (congestive) heart failure: Principal | ICD-10-CM | POA: Diagnosis present

## 2011-02-27 LAB — COMPREHENSIVE METABOLIC PANEL
ALT: 18 U/L (ref 0–53)
AST: 20 U/L (ref 0–37)
Albumin: 4.1 g/dL (ref 3.5–5.2)
Alkaline Phosphatase: 56 U/L (ref 39–117)
BUN: 19 mg/dL (ref 6–23)
CO2: 34 mEq/L — ABNORMAL HIGH (ref 19–32)
Calcium: 9.1 mg/dL (ref 8.4–10.5)
Chloride: 91 mEq/L — ABNORMAL LOW (ref 96–112)
Creatinine, Ser: 0.83 mg/dL (ref 0.50–1.35)
GFR calc Af Amer: >90 mL/min (ref >90)
GFR calc non Af Amer: 80 mL/min — ABNORMAL LOW (ref >90)
Glucose, Bld: 102 mg/dL — ABNORMAL HIGH (ref 70–99)
Potassium: 4.3 mEq/L (ref 3.5–5.1)
Sodium: 134 mEq/L — ABNORMAL LOW (ref 135–145)
Total Bilirubin: 0.5 mg/dL (ref 0.3–1.2)
Total Protein: 6.8 g/dL (ref 6.0–8.3)

## 2011-02-27 LAB — GLUCOSE, CAPILLARY: Glucose-Capillary: 98 mg/dL (ref 70–99)

## 2011-02-27 LAB — PRO B NATRIURETIC PEPTIDE: Pro B Natriuretic peptide (BNP): 4864 pg/mL — ABNORMAL HIGH (ref 0–450)

## 2011-02-27 LAB — CBC
HCT: 33.7 % — ABNORMAL LOW (ref 39.0–52.0)
Hemoglobin: 11.3 g/dL — ABNORMAL LOW (ref 13.0–17.0)
MCH: 30.4 pg (ref 26.0–34.0)
MCHC: 33.5 g/dL (ref 30.0–36.0)
MCV: 90.6 fL (ref 78.0–100.0)
Platelets: 208 10*3/uL (ref 150–400)
RBC: 3.72 MIL/uL — ABNORMAL LOW (ref 4.22–5.81)
RDW: 13 % (ref 11.5–15.5)
WBC: 7.1 10*3/uL (ref 4.0–10.5)

## 2011-02-27 LAB — PROTIME-INR
INR: 3.16 — ABNORMAL HIGH (ref 0.00–1.49)
Prothrombin Time: 32.9 seconds — ABNORMAL HIGH (ref 11.6–15.2)

## 2011-02-27 LAB — MAGNESIUM: Magnesium: 1.8 mg/dL (ref 1.5–2.5)

## 2011-02-27 LAB — TROPONIN I: Troponin I: <0.3 ng/mL (ref <0.30)

## 2011-02-27 NOTE — H&P (Signed)
NAME:  [name], AGE NO.:  [id number]  MEDICAL RECORD NO.:  [id number]  LOCATION:  [facility]LED                         FACILITY:  [facility]  PHYSICIAN:  Thomasenia Bottoms, MDDATE OF BIRTH:  Nov 10, 1926  DATE OF ADMISSION:  02/26/2011 DATE OF DISCHARGE:                             HISTORY & PHYSICAL   PRIMARY CARE PHYSICIAN:  Florentina Jenny, M.D.  CHIEF COMPLAINT:  Anxiety.  HISTORY OF PRESENTING ILLNESS:  [name] is an 77 year old gentleman who said he had an episode of incontinence today.  His roommate was present and that made him very anxious.  He says when he gets anxious, he hyperventilates, which he did tonight.  Usually, this will calm down by itself and one of nurses came in and suggested that he come to the hospital to be evaluated.  The patient denies any chest pain.  He denies any fever.  This is an occurrence that happens frequently when he gets anxious.  He tries very hard not to have these episodes of incontinence and are really bothersome when it happens.    His past medical history is significant for congestive heart failure and aortic stenosis as well as severe mitral regurgitation.  His last echo was in June 2012, which revealed an EF of 40%, moderate aortic stenosis with an aortic valve area of 1.3 sq cm, moderate-to-severe mitral regurgitation, moderate LVH, and severely dilated left atrium.  He has a history of bilateral Charcot feet deformities, hypertension, BPH, anemia, anxiety, diabetes mellitus type 2, GERD, large hiatal hernia.  He has a history of atrial fibrillation and is on Coumadin.  Last hospitalized in June 2012, he had cellulitis and pneumonia with bacteremia secondary to bacteria Alcaligenes, which is a gram-negative rod.  He was in the ICU for time as he has been hospitalized several times this year.  He had a CT, which revealed an indeterminate liver lesion and outpatient followup was recommended.  MEDICATIONS ON  ARRIVAL: 1. Per the nursing facility records, he is on bacitracin ointment with     zinc to feet 3 times weekly.  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 2. He is on Avodart soft gel 0.5 mg p.o. daily. 3. Lasix 40 mg p.o. daily. 4. Losartan 100 mg p.o. every morning. 5. Pantoprazole 40 mg p.o. daily. 6. Metformin 500 mg p.o. twice daily. 7. Coumadin 6 mg p.o. daily.  Several p.r.n. medications, 1. MiraLax 17 g in liquid by mouth daily. 2. Aspirin 81 mg p.o. daily. 3. Decubi-Vite 1 p.o. daily. 4. Ocuvite 1 p.o. daily. 5. Carvedilol 25 mg p.o. twice daily. 6. Docusate 100 mg p.o. twice daily. 7. Ferrous sulfate 325 mg p.o. twice daily. 8. Protein powder in 8 ounces of liquid by mouth twice daily.  SOCIAL HISTORY:  He does not smoke cigarettes, drink alcohol, or use any illicit drugs.  FAMILY HISTORY:  Mother died of an MI in her 77s.  Father died in his 30s in his sleep.  REVIEW OF SYSTEMS:  The patient is a poor historian.  Though he gives a very detailed account of what happened to him earlier this evening, he  is inconsistent in any of his past medical history details.  PHYSICAL EXAMINATION:  GENERAL:  The patient is a slightly disheveled, but in no acute distress and he is laying nearly flat on the bed at the time of my evaluation. VITAL SIGNS:  In the emergency department, his temperature is 98.3, pulse 88, respiratory rate 16, blood pressure 131/85, O2 saturations 100% on 2 L nasal cannula. HEENT:  Normocephalic, atraumatic.  His pupils are equal and round. Sclerae nonicteric.  Oral mucosa moist. NECK:  Supple.  No lymphadenopathy.  No thyromegaly.  No jugular venous distention noted, though he has a large beard, but no JVD even laying nearly flat. CARDIAC:  Occasionally irregular, systolic murmur. LUNGS:  Fairly, clear breath sounds.  No wheezes, rhonchi, or rales. ABDOMEN:  Obese, soft, nontender, and nondistended.  Normoactive  bowel sounds.  Did not appreciate any hepatosplenomegaly.  No rebound or guarding. EXTREMITIES:  Chronic skin changes bilaterally, some minimal erythema, but no true sign of infection.  There is 1+ edema bilaterally.  He has Charcot foot deformities bilaterally.  DP pulses not palpable.  Both of his feet are wrapped what appears to be in Ace wrap that is firm and set, not easy to remove and on top of that there is a mild compression stocking and then special slippers. NEUROLOGIC:  He is alert and oriented.  He is attentive and appropriate. Normal affect.  Cranial nerves II through XII are intact grossly.  He moves each of his extremities spontaneously.  No clear evidence of focal motor asymmetry.  Further sensory and motor examination deferred because of his overall condition, though the patient has no difficulty sitting on the side of the bed with his legs dangling independently without any assistance.  LABORATORY DATA:  The patient's white count of 7.4, hemoglobin of 11.3, hematocrit 33.9, platelet count is 208.  Urinalysis revealed negative nitrite, negative leukocytes, clear urine.  His troponin is 0.01.  INR is 3.49, PT is 35.6.  Sodium is 132, potassium 4.0, chloride 94, bicarbonate 28, glucose 102, BUN 20, creatinine 0.69.  BNP is 4685.  IMAGING:  The patient did have a chest x-ray because of shortness of breath and it revealed an enlarged cardiac silhouette, tortuous aorta, large hiatal hernia with paraesophageal herniation, slight pulmonary vascular congestion.  Persistent retrocardiac left lower lobe opacities from x-ray done November 03, 2010.  No infiltrate the mineralized bone, bibasilar atelectasis.  The patient's EKG is not available to me at this time.  ASSESSMENT AND PLAN: 1. Possible congestive heart failure exacerbation.  This patient's BNP     is exceptionally elevated, though his symptoms are quite mild and     he gives the story reporting that this is essentially  just mild     hyperventilation from an anxiety attack, but as his BNP was greater than     4000, he did receive Lasix in the ER.  He received 80 mg IV, so we     will just monitor the patient and re-evaluate him in the a.m. to     see if he needs further IV Lasix.  His symptoms have vastly     improved.  In the meantime, we will continue his losartan and his     Coreg. 2. Moderate-to-severe aortic stenosis. 3. Moderate-to-severe mitral regurgitation. 4. History of ulcers on both feet prior to arrival.  The patient has     these set bandages, not quite Unna boots on both of his feet.  I am  slightly hesitant to take them off without being able to have these     dressings redone.  It is the middle of his foot, they actually     mostly wrapped up, his heel and just above his heel that is free,     and his toes are free.  There is no sign of infection in that area.     We will try to get a sense of what is happening as an outpatient in     terms of dressing care.   Otherwise, for the patient's known history     of BPH, the patient reported the urinary incontinence and I have     asked him to follow with his urologist, should he have continued     problems.  He does not feel he needs to see a specialist at this     time. 5. Diabetes mellitus.  The patient is on metformin.  The hospital     standard protocol was to discontinue the metformin; however, I     suspect this patient will not be hospitalized for very long. 6. Anticoagulated on Coumadin secondary to atrial fibrillation.  We     will continue his Coumadin.  His INR is slightly elevated tonight.     We will have pharmacy to manage this. 7. I did discuss code status with the patient.  He says he would like     his medical decision maker to be his cousin, but he also says very     clearly he would like to be full code.     Thomasenia Bottoms, MD     CVC/MEDQ  D:  02/27/2011  T:  02/27/2011  Job:  [id number]  cc:   Florentina Jenny, MD Fax: [phone]  Electronically Signed by Buena Irish MD on 02/27/2011 06:11:09 PM

## 2011-02-28 LAB — PROTIME-INR
INR: 2.9 — ABNORMAL HIGH (ref 0.00–1.49)
Prothrombin Time: 30.8 seconds — ABNORMAL HIGH (ref 11.6–15.2)

## 2011-02-28 LAB — BASIC METABOLIC PANEL
BUN: 21 mg/dL (ref 6–23)
CO2: 30 mEq/L (ref 19–32)
Calcium: 9.3 mg/dL (ref 8.4–10.5)
Chloride: 91 mEq/L — ABNORMAL LOW (ref 96–112)
Creatinine, Ser: 0.72 mg/dL (ref 0.50–1.35)
GFR calc Af Amer: >90 mL/min (ref >90)
GFR calc non Af Amer: 85 mL/min — ABNORMAL LOW (ref >90)
Glucose, Bld: 103 mg/dL — ABNORMAL HIGH (ref 70–99)
Potassium: 3.6 mEq/L (ref 3.5–5.1)
Sodium: 133 mEq/L — ABNORMAL LOW (ref 135–145)

## 2011-02-28 LAB — GLUCOSE, CAPILLARY
Glucose-Capillary: 112 mg/dL — ABNORMAL HIGH (ref 70–99)
Glucose-Capillary: 113 mg/dL — ABNORMAL HIGH (ref 70–99)

## 2011-02-28 LAB — PRO B NATRIURETIC PEPTIDE: Pro B Natriuretic peptide (BNP): 2946 pg/mL — ABNORMAL HIGH (ref 0–450)

## 2011-02-28 LAB — MRSA PCR SCREENING: MRSA by PCR: POSITIVE — AB

## 2011-03-03 ENCOUNTER — Emergency Department (HOSPITAL_COMMUNITY): Payer: Medicare Other

## 2011-03-03 ENCOUNTER — Emergency Department (HOSPITAL_COMMUNITY)
Admission: EM | Admit: 2011-03-03 | Discharge: 2011-03-04 | Disposition: A | Discharge status: Other Acute Inpatient Hospital | Payer: Medicare Other | Source: Home / Self Care | Attending: Emergency Medicine | Admitting: Emergency Medicine

## 2011-03-03 DIAGNOSIS — N400 Enlarged prostate without lower urinary tract symptoms: Secondary | ICD-10-CM | POA: Insufficient documentation

## 2011-03-03 DIAGNOSIS — Y998 Other external cause status: Secondary | ICD-10-CM | POA: Insufficient documentation

## 2011-03-03 DIAGNOSIS — F411 Generalized anxiety disorder: Secondary | ICD-10-CM | POA: Insufficient documentation

## 2011-03-03 DIAGNOSIS — Z79899 Other long term (current) drug therapy: Secondary | ICD-10-CM | POA: Insufficient documentation

## 2011-03-03 DIAGNOSIS — K449 Diaphragmatic hernia without obstruction or gangrene: Secondary | ICD-10-CM | POA: Insufficient documentation

## 2011-03-03 DIAGNOSIS — W010XXA Fall on same level from slipping, tripping and stumbling without subsequent striking against object, initial encounter: Secondary | ICD-10-CM | POA: Insufficient documentation

## 2011-03-03 DIAGNOSIS — G609 Hereditary and idiopathic neuropathy, unspecified: Secondary | ICD-10-CM | POA: Insufficient documentation

## 2011-03-03 DIAGNOSIS — I872 Venous insufficiency (chronic) (peripheral): Secondary | ICD-10-CM | POA: Insufficient documentation

## 2011-03-03 DIAGNOSIS — Z7901 Long term (current) use of anticoagulants: Secondary | ICD-10-CM | POA: Insufficient documentation

## 2011-03-03 DIAGNOSIS — Z7982 Long term (current) use of aspirin: Secondary | ICD-10-CM | POA: Insufficient documentation

## 2011-03-03 DIAGNOSIS — Y92009 Unspecified place in unspecified non-institutional (private) residence as the place of occurrence of the external cause: Secondary | ICD-10-CM | POA: Insufficient documentation

## 2011-03-03 DIAGNOSIS — I4891 Unspecified atrial fibrillation: Secondary | ICD-10-CM | POA: Insufficient documentation

## 2011-03-03 DIAGNOSIS — S06300A Unspecified focal traumatic brain injury without loss of consciousness, initial encounter: Secondary | ICD-10-CM | POA: Insufficient documentation

## 2011-03-03 LAB — COMPREHENSIVE METABOLIC PANEL
Alkaline Phosphatase: 53 U/L (ref 39–117)
BUN: 25 mg/dL — ABNORMAL HIGH (ref 6–23)
CO2: 28 mEq/L (ref 19–32)
Chloride: 91 mEq/L — ABNORMAL LOW (ref 96–112)
Creatinine, Ser: 0.84 mg/dL (ref 0.50–1.35)
GFR calc non Af Amer: 79 mL/min — ABNORMAL LOW (ref >90)
Glucose, Bld: 91 mg/dL (ref 70–99)
Potassium: 4.3 mEq/L (ref 3.5–5.1)
Total Bilirubin: 0.7 mg/dL (ref 0.3–1.2)

## 2011-03-03 LAB — DIFFERENTIAL
Eosinophils Absolute: 0.3 10*3/uL (ref 0.0–0.7)
Eosinophils Relative: 3 % (ref 0–5)
Lymphs Abs: 1.5 10*3/uL (ref 0.7–4.0)
Monocytes Absolute: 0.7 10*3/uL (ref 0.1–1.0)

## 2011-03-03 LAB — URINALYSIS, ROUTINE W REFLEX MICROSCOPIC
Bilirubin Urine: NEGATIVE
Hgb urine dipstick: NEGATIVE
Ketones, ur: NEGATIVE mg/dL
Nitrite: NEGATIVE
Protein, ur: NEGATIVE mg/dL
Specific Gravity, Urine: 1.016 (ref 1.005–1.030)
Urobilinogen, UA: 0.2 mg/dL (ref 0.0–1.0)

## 2011-03-03 LAB — CBC
MCH: 30.3 pg (ref 26.0–34.0)
MCHC: 33.4 g/dL (ref 30.0–36.0)
MCV: 90.5 fL (ref 78.0–100.0)
Platelets: 220 10*3/uL (ref 150–400)
RDW: 12.8 % (ref 11.5–15.5)
WBC: 10.1 10*3/uL (ref 4.0–10.5)

## 2011-03-03 LAB — POCT I-STAT TROPONIN I

## 2011-03-03 NOTE — Discharge Summary (Signed)
NAME:  [name], [name] NO.:  [id number]  MEDICAL RECORD NO.:  [id number]  LOCATION:                                 FACILITY:  PHYSICIAN:  Zannie Cove, MD     DATE OF BIRTH:  08/31/1926  DATE OF ADMISSION: DATE OF DISCHARGE:                        DISCHARGE SUMMARY - REFERRING   PRIMARY CARE PHYSICIAN:  Florentina Jenny, MD  DISCHARGE DIAGNOSES: 1. Panic attack. 2. Systolic congestive heart failure exacerbation, moderate aortic     stenosis. 3. Severe mitral regurgitation. 4. Paroxysmal atrial fibrillation, on Coumadin. 5. Protein-calorie malnutrition. 6. Gastroesophageal reflux disease. 7. Chronic venous stasis. 8. Bilateral Charcot feet with small healing wound on the left foot. 9. Benign prostatic hypertrophy. 10.History of generalized anxiety disorder. 11.Indeterminant liver lesion, found on imaging in May 2012, needs     outpatient abdominal MRI to further evaluate this. 12.Type 2 diabetes.  DISCHARGE MEDICATIONS: 1. Lasix 60 mg for the next 2 days and then 40 mg daily. 2. Aspirin 81 mg daily. 3. Avodart 0.5 mg capsule daily. 4. Bacitracin 500 units/g ointment 1 application topically to feet 3     times a week. 5. Benadryl 2/0.1% cream over-the-counter 1 application topically     b.i.d. p.r.n. 6. Benzonatate 100 mg capsule q.8 h. p.r.n. for cough. 7. Chloraseptic throat spray 2 sprays q.2 h. p.r.n. 8. Carvedilol 25 mg p.o. b.i.d. 9. Decubi-Vite 400/50/500 one capsule daily. 10.Docusate sodium 100 mg capsule b.i.d. 11.Ferrous sulfate 325 mg p.o. b.i.d. 12.Losartan 100 mg daily. 13.Metformin 500 mg p.o. b.i.d. 14.Mylanta 30 mL p.o. q.4 h. p.r.n. 15.Ocuvite 1 tab daily. 16.Polyethylene glycol (MiraLax) 17 g p.o. daily. 17.Protonix 40 mg daily. 18.Systane ultra lubricant eyedrops 1 drop in both eyes daily p.r.n. 19.Triamcinolone acetonide 0.1% solution/Cetaphil 2 to 1 solution 1     application topically b.i.d. to rash. 20.Tylenol  extra-strength 500 mg p.o. q.6 h. p.r.n. 21.Coumadin 6 mg daily.  DIAGNOSTIC INVESTIGATIONS:  Chest x-ray on February 26, 2011, enlarged cardiac silhouette, bibasilar atelectasis, large hiatal hernia.  No acute cardiopulmonary disease.  HOSPITAL COURSE:  [name] is an 77 year old white male with prior history of systolic CHF, was presented to the hospital with acute onset shortness of breath and an anxiety attack and hyperventilation.  The patient reported that he had an episode of incontinence in front of his roommate that made him very anxious and started hyperventilating and subsequently sent to the hospital. On evaluation in the hospital, his symptoms improved with an antilithic, but further evaluation led to the detection of a BNP which was 4800, and his chronic lower extremity edema and hence admitted to the hospital for further evaluation. 1. Anxiety attack, resolved.  The patient stabilized, did not have any     further instances of panic. 2. Acute on chronic systolic CHF.  He had an echo done 3 months ago,     which showed an ejection fraction of 45% with diffuse hypokinesis,     severe mitral regurg, and moderate aortic stenosis.  This was in     the setting of sepsis and bacteremia at that time hence he will     need a repeat echo to re-evaluate his LV  function and this can be     done as an outpatient since the patient is anxious and eager to be     discharged.  For his systolic CHF, he was diuresed with IV Lasix     and his BNP improved from 4800-2900 at the time of discharge.  He     is to increase his Lasix to 60 mg for the next 2 days and then go     back to 40 mg daily.  He is also asked to continue his Coreg. 3. Chronic venous stasis, stable. 4. Chronic Charcot feet with wound.  Continue wound care.  It appears     to be healing well. 5. Diabetes, stable. 6. Paroxysmal AFib, rate controlled.  Continued on Coumadin. 7. History of indeterminant liver lesion  detected on an abdominal CT     on Oct 07, 2010.  He will probably abdominal MRI to further     evaluate this and again can be done as an outpatient since the     patient is anxious to go home.  DISCHARGE CONDITION:  Stable.  DISCHARGE FOLLOWUP:  Dr. Florentina Jenny in 7-10 days.  On followup, the patient needs outpatient 2D echo as well as an abdominal MRI to evaluate his liver lesions.     Zannie Cove, MD     PJ/MEDQ  D:  02/28/2011  T:  02/28/2011  Job:  [id number]  cc:   Florentina Jenny, MD  Electronically Signed by Zannie Cove  on 03/03/2011 10:52:56 AM

## 2011-03-03 NOTE — H&P (Signed)
NAME:  [name], ROSENBERGER NO.:  [id number]  MEDICAL RECORD NO.:  [id number]  LOCATION:  [facility]LED                         FACILITY:  [facility]  PHYSICIAN:  Vania Rea, M.D. DATE OF BIRTH:  [birth year]  DATE OF ADMISSION:  03/03/2011 DATE OF DISCHARGE:                             HISTORY & PHYSICAL   PRIMARY CARE PHYSICIAN:  Dr. Florentina Jenny.  DICTATING PHYSICIAN:  Dr. Vania Rea.  CHIEF COMPLAINT:  Feeling strange.  HISTORY OF PRESENT ILLNESS:  This is an 77 year old Caucasian gentleman with multiple medical problems including paroxysmal atrial fibrillation on chronic Coumadin anticoagulation who has been having occipital headaches for the past few days, but had a sudden change in his status after he fell off the bed last night and hit his head.  The patient sustained no lacerations or bruising of his head as a result of the fall.  The staff did not feel he needed immediate medical evaluation but the patient awoke this morning feeling slightly confused and knowing something was not quite right, was having a headache but was not very clear.  Had some difficulty remembering some things and asked to be transported to the hospital for further evaluation.  The patient arrived to the [facility] Emergency Department late this morning and on evaluation was found to have a 2.8 cm intracerebral bleed.  After discussion with the neurosurgeon at [facility], the hospitalist was called to assist with admission and transfer for her neurological assessment.  Apart from slight headache, the patient denies any nausea or vomiting. Denies any chest pains or shortness of breath.  He does report he is having increasing difficulty walking because of weakness and typically walks with a cane, sometimes walker, but also has gait impairment because of severe Charcot joints of the ankles and chronic lower extremity ulcerations.  The patient has had no bleeding from his nose, no black  nor bloody stools, no hematuria.  PAST MEDICAL HISTORY:  Generalized anxiety disorder, paroxysmal atrial fibrillation, bilateral lower extremity Charcot joints, chronic systolic heart failure, moderate aortic stenosis, severe mitral regurg, peripheral neuropathy, prostatic hypertrophy, chronic lower extremity venous stasis.  MEDICATIONS: 1. Warfarin daily. 2. Mupirocin nasal drops twice daily. 3. Lasix daily. 4. Systane eye drops daily as needed. 5. Chloraseptic throat spray as needed. 6. Tylenol 500 mg every 6 hours as needed. 7. Tessalon Perles 100 mg every 8 hours as needed. 8. Benadryl cream as needed. 9. Mylanta as needed. 10.MiraLax 17 g daily as needed. 11.ProMod protein powder 1 scoop twice daily. 12.Iron sulfate 325 mg twice daily. 13.Colace 100 mg twice daily. 14.Carvedilol 25 mg twice daily. 15.Ocuvite tablets daily. 16.Aspirin 81 mg daily. 17.Protonix 40 mg daily. 18.Losartan 100 mg daily. 19.Avodart 0.5 mg daily.  ALLERGIES:  No known drug allergies.  He is allergic to ADHESIVE TAPE which causes a rash.  SOCIAL HISTORY:  Denies tobacco, alcohol, or illicit drug use.  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  Continues to be active, although less so now.  He lives in an assisted living facility.  FAMILY HISTORY:  Significant for a mother who died of an MI in her 19s, the father was fairly healthy but died  in his 30s.  REVIEW OF SYSTEMS:  Other than noted above unremarkable.  PHYSICAL EXAMINATION:  GENERAL:  A very pleasant elderly, somewhat undernourished Caucasian gentleman, sitting up in the stretcher in no acute respiratory distress.  He is alert and oriented x2 but sometimes need to be reoriented to place.  He is very clear as to the reason why he is here. VITAL SIGNS:  His temperature is 98.7, pulse 100 and irregular, respiration 18, blood pressure 134/69.  He is saturating at 99% on 2 L. HEENT:  His pupils are round and equal and reactive.  Mucous  membranes pink.  Anicteric.  No cervical lymphadenopathy or thyromegaly.  No carotid bruits. CHEST:  He has marked kyphosis.  He has bibasilar crackles but no wheezing. CARDIOVASCULAR SYSTEM:  He has an irregularly irregular rhythm.  He has a rough systolic murmur of the left lower sternal border and a high- pitched systolic murmur at the right upper sternal border. ABDOMEN:  Mildly distended because of his kyphosis but it is soft. There are no masses, no tenderness. EXTREMITIES:  Somewhat erythematous in lower legs related to his ulcers. He has bandage ulcers and he has marked deformity of the ankles related to the Charcot joints. SKIN:  Hyperemia of the lower legs as noted. CENTRAL NERVOUS SYSTEM:  Cranial nerves II-XII are grossly intact and apart from the weakness of the legs related to his vascular and musculoskeletal problems, he has no focal lateralizing signs.  LABORATORY DATA:  His white count is 10.1, hemoglobin 11.8, MCV 90.5, platelets 220,000.  He has a normal differential.  His sodium is 128, potassium 4.3, chloride 91, CO2 28, glucose 91, BUN 25, creatinine 0.8, calcium 9.0.  His liver functions are unremarkable.  His cardiac enzymes are unremarkable.  His PT is 37.9.  His INR is 3.78.  His urinalysis is completely normal.  Portable chest x-ray shows mild stable cardiomegaly, stable large hiatal hernia containing most of the stomach, no acute cardiopulmonary findings.  CT scan of the brain without contrast shows a 2.8 cm hemorrhage medially within the right cerebral hemisphere posteriorly, although the exact location is difficult to determine.  It is possibly intraparenchymal, possibly subependymal.  He has atrophy and chronic small vessel disease.  There is a small amount of intraventricular blood within the right occipital horn.  CT scan of the cervical spine shows severe spondylosis but no acute bony abnormalities.  ASSESSMENT: 1. Acute intracerebral bleed. 2.  Coagulopathy secondary to Coumadin with supratherapeutic INR. 3. Chronic anemia. 4. Chronic systolic heart failure. 5. Aortic stenosis and mitral regurg. 6. History of anxiety disorder. 7. Chronic Charcot joint. 8. Chronic venous stasis with chronic lower extremity ulceration. 9. Gait abnormality.  PLAN: 1. Agree with reversal of this patient's anticoagulation with vitamin     K 10 mg which have already been given.  We will increase his FFP to     4 units IV and we will give 40 mg of Lasix after the first 2 units.     We will transfer him to a neuro tele bed at [facility] where he can     be evaluated by Dr. Danielle Dess. 2. Implications of reversing his Coumadin has been explained to the     patient, and he has accepted the risk benefits ratio. 3. We will continue his outpatient medications except Coumadin and     aspirin. 4. Other plans as per orders.     Vania Rea, M.D.     LC/MEDQ  D:  03/03/2011  T:  03/03/2011  Job:  [id number]  cc:   Florentina Jenny, MD Fax: [phone]  Stefani Dama, M.D. Fax: [phone]

## 2011-03-04 ENCOUNTER — Inpatient Hospital Stay (HOSPITAL_COMMUNITY)
Admission: EM | Admit: 2011-03-04 | Discharge: 2011-03-07 | DRG: 086 | Disposition: A | Discharge status: Skilled Nursing Facility | Payer: Medicare Other | Source: Ambulatory Visit | Attending: Internal Medicine | Admitting: Internal Medicine

## 2011-03-04 DIAGNOSIS — Z9181 History of falling: Secondary | ICD-10-CM

## 2011-03-04 DIAGNOSIS — Y998 Other external cause status: Secondary | ICD-10-CM

## 2011-03-04 DIAGNOSIS — Z7901 Long term (current) use of anticoagulants: Secondary | ICD-10-CM

## 2011-03-04 DIAGNOSIS — N400 Enlarged prostate without lower urinary tract symptoms: Secondary | ICD-10-CM | POA: Diagnosis present

## 2011-03-04 DIAGNOSIS — I509 Heart failure, unspecified: Secondary | ICD-10-CM | POA: Diagnosis present

## 2011-03-04 DIAGNOSIS — I5022 Chronic systolic (congestive) heart failure: Secondary | ICD-10-CM | POA: Diagnosis present

## 2011-03-04 DIAGNOSIS — T45515A Adverse effect of anticoagulants, initial encounter: Secondary | ICD-10-CM | POA: Diagnosis present

## 2011-03-04 DIAGNOSIS — I959 Hypotension, unspecified: Secondary | ICD-10-CM | POA: Diagnosis present

## 2011-03-04 DIAGNOSIS — Z7982 Long term (current) use of aspirin: Secondary | ICD-10-CM

## 2011-03-04 DIAGNOSIS — R296 Repeated falls: Secondary | ICD-10-CM | POA: Diagnosis present

## 2011-03-04 DIAGNOSIS — S06300A Unspecified focal traumatic brain injury without loss of consciousness, initial encounter: Principal | ICD-10-CM | POA: Diagnosis present

## 2011-03-04 DIAGNOSIS — R791 Abnormal coagulation profile: Secondary | ICD-10-CM | POA: Diagnosis present

## 2011-03-04 DIAGNOSIS — I4891 Unspecified atrial fibrillation: Secondary | ICD-10-CM | POA: Diagnosis present

## 2011-03-04 DIAGNOSIS — D649 Anemia, unspecified: Secondary | ICD-10-CM | POA: Diagnosis present

## 2011-03-04 LAB — TYPE AND SCREEN
ABO/RH(D): O POS
Antibody Screen: NEGATIVE
Antibody Screen: NEGATIVE

## 2011-03-04 LAB — PREPARE FRESH FROZEN PLASMA: Unit division: 0

## 2011-03-04 LAB — CBC
MCH: 30.3 pg (ref 26.0–34.0)
MCHC: 33.7 g/dL (ref 30.0–36.0)
MCV: 90.1 fL (ref 78.0–100.0)
Platelets: 183 10*3/uL (ref 150–400)
RBC: 3.43 MIL/uL — ABNORMAL LOW (ref 4.22–5.81)
RDW: 13 % (ref 11.5–15.5)

## 2011-03-04 LAB — BASIC METABOLIC PANEL
BUN: 21 mg/dL (ref 6–23)
Calcium: 9.1 mg/dL (ref 8.4–10.5)
Creatinine, Ser: 0.85 mg/dL (ref 0.50–1.35)
GFR calc Af Amer: >90 mL/min (ref >90)
GFR calc non Af Amer: 79 mL/min — ABNORMAL LOW (ref >90)
Glucose, Bld: 83 mg/dL (ref 70–99)

## 2011-03-04 LAB — PROTIME-INR: Prothrombin Time: 18.1 seconds — ABNORMAL HIGH (ref 11.6–15.2)

## 2011-03-05 ENCOUNTER — Inpatient Hospital Stay (HOSPITAL_COMMUNITY): Payer: Medicare Other

## 2011-03-05 LAB — BASIC METABOLIC PANEL
Chloride: 91 mEq/L — ABNORMAL LOW (ref 96–112)
GFR calc Af Amer: >90 mL/min (ref >90)
GFR calc non Af Amer: 84 mL/min — ABNORMAL LOW (ref >90)
Glucose, Bld: 97 mg/dL (ref 70–99)
Potassium: 3.9 mEq/L (ref 3.5–5.1)
Sodium: 130 mEq/L — ABNORMAL LOW (ref 135–145)

## 2011-03-05 LAB — GLUCOSE, CAPILLARY
Glucose-Capillary: 94 mg/dL (ref 70–99)
Glucose-Capillary: 124 mg/dL — ABNORMAL HIGH (ref 70–99)
Glucose-Capillary: 104 mg/dL — ABNORMAL HIGH (ref 70–99)

## 2011-03-05 LAB — CBC
HCT: 34.4 % — ABNORMAL LOW (ref 39.0–52.0)
Hemoglobin: 11.7 g/dL — ABNORMAL LOW (ref 13.0–17.0)
MCH: 30 pg (ref 26.0–34.0)
MCV: 88.2 fL (ref 78.0–100.0)
Platelets: 216 10*3/uL (ref 150–400)
RBC: 3.9 MIL/uL — ABNORMAL LOW (ref 4.22–5.81)
WBC: 7.2 10*3/uL (ref 4.0–10.5)

## 2011-03-06 LAB — GLUCOSE, CAPILLARY

## 2011-03-07 LAB — GLUCOSE, CAPILLARY

## 2011-03-08 LAB — PREPARE FRESH FROZEN PLASMA
Unit division: 0
Unit division: 0

## 2011-03-08 LAB — GLUCOSE, CAPILLARY: Glucose-Capillary: 159 mg/dL — ABNORMAL HIGH (ref 70–99)

## 2011-03-11 NOTE — Discharge Summary (Signed)
NAME:  [name], [name] NO.:  [id number]  MEDICAL RECORD NO.:  [id number]  LOCATION:                                 FACILITY:  PHYSICIAN:  Daniel Scott, MD     DATE OF BIRTH:  07-01-1926  DATE OF ADMISSION:  03/03/2011 DATE OF DISCHARGE:  03/07/2011                        DISCHARGE SUMMARY - REFERRING   PRIMARY CARE PHYSICIAN:  Florentina Jenny, MD.  DISCHARGE DIAGNOSES: 1. Intracerebral bleed secondary to coagulopathy and fall.  Coumadin     discontinued. 2. Paroxysmal atrial fibrillation. 3. Coagulopathy secondary to Coumadin.  Coumadin discontinued. 4. Anemia. 5. Chronic systolic congestive heart failure, compensated. 6. History of chronic Charcot joints with wounds on lower extremity. 7. Gait abnormality. 8. Recurrent falls.  DISCHARGE MEDICATIONS: 1. Losartan 50 mg p.o. daily. 2. Avodart 0.5 mg p.o. daily. 3. Benadryl 2-0.1% cream over-the-counter, one application topically     b.i.d. p.r.n. for skin irritation. 4. Benzonatate 100 mg p.o. q.8 hours hourly p.r.n. for cough. 5. Chloraseptic throat spray, two sprays p.o. q.2 hourly p.r.n. for     sore throat. 6. Coreg 25 mg p.o. b.i.d. at 8 am and 5 p.m. 7. Decubi-Vite 400/50/500 capsule, one capsule p.o. daily. 8. Docusate sodium 100 mg p.o. b.i.d. 9. Ferrous sulfate 325 mg p.o. b.i.d. 10.Furosemide 40 mg p.o. daily. 11.Mylanta 30 mL p.o. q.4 hourly p.r.n. for indigestion. 12.Ocuvite one tablet p.o. daily. 13.Polyethylene glycol 17 g p.o. daily. 14.Protonix 40 mg p.o. daily. 15.ProMod one scoop p.o. b.i.d. 16.Systane Ultra Lubricant eye drops, one drop in both eyes daily     p.r.n. for dry eyes. 17.Triamcinolone acetonide 0.1% lotion/Cetaphil 2-1 lotion, one     application topically b.i.d. p.r.n. for skin irritation. 18.Tylenol Extra Strength 500 mg p.o. q.6 hourly p.r.n. for pain.  Discontinued medications; aspirin, Coumadin, mupirocin nasal, bacitracin ointment.  IMAGING: 1. CT of the head  without contrast on March 05, 2011, impression,     similar appearance of the right posterior temporal-occipital lobe     hematoma. 2. CT of the head without contrast, October 18, impression, 2.8 cm     hemorrhage medially within the right synovitis.  It is difficult to     determine if this is intraparenchymal or subependymal.  There is a     small amount of intraventricular blood within the right occipital     horn.  No hydrocephalus.  Atrophy and chronic small vessel disease. 3. CT of the cervical spine, impression, severe spondylosis.  No acute     bony abnormality. 4. Chest x-ray March 03, 2011, impression, mild stable cardiac     enlargement.  Stable large hiatal hernia containing most of the     stomach.  No acute pulmonary findings.  LABORATORY DATA:  Basic metabolic panel significant for sodium of 130, chloride 91, INR 1.12.  CBC:  Hemoglobin 11.7, hematocrit 34, white blood cells 7.2, platelets 216.  Hepatic panel within normal limits.  CONSULTATIONS:  Neurosurgery, Dr. Barnett Abu.  DIET:  Heart healthy diet.  ACTIVITIES:  Out of bed with assistance and increase activity gradually.  WOUND CARE: 1. Bactroban to right foot callus area daily.  May leave open to air. 2. Bactroban  to left foot wound, then tuck 2 x 2 into wound using swab     and cover with Kerlix and Ace wrap and change every Friday.  TODAY'S COMPLAINTS:  Mild intermittent headaches.  PHYSICAL EXAMINATION:  GENERAL:  The patient is in no obvious distress. VITAL SIGNS:  Temperature 97.2 degrees Fahrenheit, pulse 103 per minute and regular, respiration 19 per minute, blood pressure 110/69 mmHg, and saturating at 91% on room air. RESPIRATORY SYSTEM:  Clear.  No increased work of breathing. CARDIOVASCULAR SYSTEM:  First and second heart sounds heard, irregular. No JVD. ABDOMEN:  Nondistended, soft.  Normal bowel sounds heard. CENTRAL NERVOUS SYSTEM:  The patient is awake, alert, oriented x4 with no  focal neurological deficits. EXTREMITIES:  With changes of Charcot joint.  HOSPITAL COURSE:  [name] is a very pleasant 77 year old male patient with history of paroxysmal atrial fibrillation, on Coumadin, bilateral lower extremity Charcot  joints, chronic systolic congestive heart failure, moderate aortic stenosis and severe mitral regurgitation, benign prostatic hypertrophy, and chronic lower extremity venous stasis, who was transferred from assisted living facility with history of occipital headaches and altered mental status.  He has history of recurrent falls and fell off the bed the night prior and hit his head. He was transferred to the emergency room at [facility] initially.  There CT of the head revealed intracerebral bleed.  His case was discussed with the Neurosurgeon, who recommended transferring the patient to [facility] for evaluation by them and further management.  1. Intracerebral hemorrhage secondary to fall and head injury in the     context of coagulopathy from Coumadin.  The patient was admitted to     the hospital.  His coagulopathy was reversed with vitamin K and     FFP.  His neurological evaluation was performed frequently.  With     these, the patient has clinically done well.  Neurosurgery     consulted and they did not recommend any surgical intervention.     Obviously, his Coumadin and antiplatelet agents have been     discontinued.  He probably should not be on anticoagulants     indefinitely secondary to history of recurrent falls and risk of     catastrophic bleeding. 2. Paroxysmal atrial fibrillation.  He has controlled ventricular rate     and Coumadin discontinued at this time. 3. Chronic systolic congestive heart failure.  This is clinically     compensated.  Continue Lasix. 4. Anemia.  This is stable.  Follow up as an outpatient periodically. 5. Type 2 diabetes mellitus.  His blood sugars ranged from 104-124     mg/dL with no  sliding scale insulin requirements. 6. Bilateral lower extremity Charcot joint.  Wound management per the     wound care nurse's recommendations. 7. Mild hypotension.  His Cozaar dose was reduced and this has     improved.  DISPOSITION:  The patient is being transferred to a skilled nursing facility in stable condition.  FOLLOWUP RECOMMENDATIONS:  Coumadin has been discontinued in the hospital and the patient should probably be off it indefinitely.  To consider starting him on aspirin in a few weeks from hospital discharge as deemed necessary by the physician seeing him at the nursing facility.  Time taken in coordinating this discharge is 30 minutes.     Daniel Scott, MD     AH/MEDQ  D:  03/07/2011  T:  03/07/2011  Job:  [id number]  cc:   Florentina Jenny, MD  Electronically Signed by Daniel Scott MD on 03/11/2011 09:34:25 PM

## 2011-03-14 NOTE — H&P (Signed)
NAME:  [name], [name] NO.:  [id number]  MEDICAL RECORD NO.:  [id number]  LOCATION:  [facility]                         FACILITY:  [facility]  PHYSICIAN:  Vania Rea, M.D. DATE OF BIRTH:  1926/10/14  DATE OF ADMISSION:  03/03/2011 DATE OF DISCHARGE:  03/04/2011                             HISTORY & PHYSICAL   PRIMARY CARE PHYSICIAN:  Dr. Florentina Jenny.  DICTATING PHYSICIAN:  Dr. Vania Rea.  CHIEF COMPLAINT:  Feeling strange.  HISTORY OF PRESENT ILLNESS:  This is an 77 year old Caucasian gentleman with multiple medical problems including paroxysmal atrial fibrillation on chronic Coumadin anticoagulation who has been having occipital headaches for the past few days, but had a sudden change in his status after he fell off the bed last night and hit his head.  The patient sustained no lacerations or bruising of his head as a result of the fall.  The staff did not feel he needed immediate medical evaluation but the patient awoke this morning feeling slightly confused and knowing something was not quite right, was having a headache but was not very clear.  Had some difficulty remembering some things and asked to be transported to the hospital for further evaluation.  The patient arrived to the [facility] Emergency Department late this morning and on evaluation was found to have a 2.8 cm intracerebral bleed.  After discussion with the neurosurgeon at [facility], the hospitalist was called to assist with admission and transfer for her neurological assessment.  Apart from slight headache, the patient denies any nausea or vomiting. Denies any chest pains or shortness of breath.  He does report he is having increasing difficulty walking because of weakness and typically walks with a cane, sometimes walker, but also has gait impairment because of severe Charcot joints of the ankles and chronic lower extremity ulcerations.  The patient has had no bleeding from his  nose, no black nor bloody stools, no hematuria.  PAST MEDICAL HISTORY:  Generalized anxiety disorder, paroxysmal atrial fibrillation, bilateral lower extremity Charcot joints, chronic systolic heart failure, moderate aortic stenosis, severe mitral regurg, peripheral neuropathy, prostatic hypertrophy, chronic lower extremity venous stasis.  MEDICATIONS: 1. Warfarin daily. 2. Mupirocin nasal drops twice daily. 3. Lasix daily. 4. Systane eye drops daily as needed. 5. Chloraseptic throat spray as needed. 6. Tylenol 500 mg every 6 hours as needed. 7. Tessalon Perles 100 mg every 8 hours as needed. 8. Benadryl cream as needed. 9. Mylanta as needed. 10.MiraLax 17 g daily as needed. 11.ProMod protein powder 1 scoop twice daily. 12.Iron sulfate 325 mg twice daily. 13.Colace 100 mg twice daily. 14.Carvedilol 25 mg twice daily. 15.Ocuvite tablets daily. 16.Aspirin 81 mg daily. 17.Protonix 40 mg daily. 18.Losartan 100 mg daily. 19.Avodart 0.5 mg daily.  ALLERGIES:  No known drug allergies.  He is allergic to ADHESIVE TAPE which causes a rash.  SOCIAL HISTORY:  Denies tobacco, alcohol, or illicit drug use.  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  Continues to be active, although less so now.  He lives in an assisted living facility.  FAMILY HISTORY:  Significant for a mother who died of an MI in her 72s, the father was fairly healthy  but died in his 52s.  REVIEW OF SYSTEMS:  Other than noted above unremarkable.  PHYSICAL EXAMINATION:  GENERAL:  A very pleasant elderly, somewhat undernourished Caucasian gentleman, sitting up in the stretcher in no acute respiratory distress.  He is alert and oriented x2 but sometimes need to be reoriented to place.  He is very clear as to the reason why he is here. VITAL SIGNS:  His temperature is 98.7, pulse 100 and irregular, respiration 18, blood pressure 134/69.  He is saturating at 99% on 2 L. HEENT:  His pupils are round and equal and  reactive.  Mucous membranes pink.  Anicteric.  No cervical lymphadenopathy or thyromegaly.  No carotid bruits. CHEST:  He has marked kyphosis.  He has bibasilar crackles but no wheezing. CARDIOVASCULAR SYSTEM:  He has an irregularly irregular rhythm.  He has a rough systolic murmur of the left lower sternal border and a high- pitched systolic murmur at the right upper sternal border. ABDOMEN:  Mildly distended because of his kyphosis but it is soft. There are no masses, no tenderness. EXTREMITIES:  Somewhat erythematous in lower legs related to his ulcers. He has bandage ulcers and he has marked deformity of the ankles related to the Charcot joints. SKIN:  Hyperemia of the lower legs as noted. CENTRAL NERVOUS SYSTEM:  Cranial nerves II-XII are grossly intact and apart from the weakness of the legs related to his vascular and musculoskeletal problems, he has no focal lateralizing signs.  LABORATORY DATA:  His white count is 10.1, hemoglobin 11.8, MCV 90.5, platelets 220,000.  He has a normal differential.  His sodium is 128, potassium 4.3, chloride 91, CO2 28, glucose 91, BUN 25, creatinine 0.8, calcium 9.0.  His liver functions are unremarkable.  His cardiac enzymes are unremarkable.  His PT is 37.9.  His INR is 3.78.  His urinalysis is completely normal.  Portable chest x-ray shows mild stable cardiomegaly, stable large hiatal hernia containing most of the stomach, no acute cardiopulmonary findings.  CT scan of the brain without contrast shows a 2.8 cm hemorrhage medially within the right cerebral hemisphere posteriorly, although the exact location is difficult to determine.  It is possibly intraparenchymal, possibly subependymal.  He has atrophy and chronic small vessel disease.  There is a small amount of intraventricular blood within the right occipital horn.  CT scan of the cervical spine shows severe spondylosis but no acute bony abnormalities.  ASSESSMENT: 1. Acute  intracerebral bleed. 2. Coagulopathy secondary to Coumadin with supratherapeutic INR. 3. Chronic anemia. 4. Chronic systolic heart failure. 5. Aortic stenosis and mitral regurg. 6. History of anxiety disorder. 7. Chronic Charcot joint. 8. Chronic venous stasis with chronic lower extremity ulceration. 9. Gait abnormality.  PLAN: 1. Agree with reversal of this patient's anticoagulation with vitamin     K 10 mg which have already been given.  We will increase his FFP to     4 units IV and we will give 40 mg of Lasix after the first 2 units.     We will transfer him to a neuro tele bed at [facility] where he can     be evaluated by Dr. Danielle Dess. 2. Implications of reversing his Coumadin has been explained to the     patient, and he has accepted the risk benefits ratio. 3. We will continue his outpatient medications except Coumadin and     aspirin. 4. Other plans as per orders.     Vania Rea, M.D.  LC/MEDQ  D:  03/03/2011  T:  03/14/2011  Job:  [id number]  Electronically Signed by Vania Rea M.D. on 03/14/2011 11:53:09 PM

## 2011-03-16 NOTE — Consult Note (Signed)
NAMEMarland Kitchen  Daniel, [name] NO.:  [id number]  MEDICAL RECORD NO.:  [id number]  LOCATION:  3030                         FACILITY:  [facility]  PHYSICIAN:  Stefani Dama, M.D.  DATE OF BIRTH:  1926/09/25  DATE OF CONSULTATION:  03/04/2011 DATE OF DISCHARGE:                                CONSULTATION   PRIMARY CARE PHYSICIAN:  Florentina Jenny, MD  REQUESTING PHYSICIAN:  Samuel Jester, DO  REASON FOR REQUEST:  Intracerebral hemorrhage.  HISTORY OF PRESENT ILLNESS:  [name]. [name] is an 77 year old right-handed white male who tells me that he had developed some spatial disorientation at home, could not tell what room he was in and where his things were relative to where he was at.  Because of this experience, he was seen ultimately in the emergency department.  He sensed something was wrong and CT scan of his brain was performed and it was found that he had an intracerebral hemorrhage deep in the right parietal lobe in the periventricular region measuring around 2.5 cm in size with a slight amount of frank intraventricular blood on that right side.  There was no significant shift or mass effect and the patient does have a moderate amount of atrophy.  The patient has been on Coumadin anticoagulation secondary to atrial fibrillation with his INR being 3.78 on presentation.  He has been admitted to the Hospitalist Service for observation, reversal of his Coumadin anticoagulation, and possible treatment if necessary of his intraventricular hemorrhage.  The patient does admit to some history of tripping and falling and unsteadiness of his gait.  PAST MEDICAL HISTORY:  He has anemia, anxiety, atrial fibrillation, Charcot joints, history of hiatal hernia, hyponatremia, peripheral neuropathy, prostatic hypertrophy, and venous stasis.  SOCIAL HISTORY:  He lives in assisted living.  He does not use alcohol or does not smoke.  FAMILY HISTORY:  Not known.  PHYSICAL  EXAMINATION:  He is alert and oriented.  His speech is clear, fluent, and his thought processes appear to be intact.  His pupils are 3 mm and reactive.  Extraocular movements are full.  Face is symmetric. Tongue and uvula in the midline.  There is no evidence of a cortical drift.  His motor strength appears intact in the upper and lower extremities.  His gait was not tested.  His deep tendon reflexes are absent in the patellae and the Achilles, trace in the biceps, 2+ in the triceps.  Babinski reflexes are equivocal bilaterally.  IMPRESSION:  The patient has evidence of a small intracerebral hemorrhage in the subependymal region deep in the periventricular area on the right side.  This does not create significant mass effect secondary to the patient having significant atrophy.  At this point, the patient's coagulopathy is being reversed with fresh frozen plasma.  He is neurologically stable and simple observation will be appropriate.  I do not believe that the patient should be exposed to further anticoagulation if at all possible, and his clinical condition can be simply observed.  It is doubtful that he will require surgical intervention.  We will follow along with a followup CT scan tomorrow morning.     Stefani Dama, M.D.  HJE/MEDQ  D:  03/04/2011  T:  03/04/2011  Job:  [id number]  Electronically Signed by Barnett Abu M.D. on 03/16/2011 07:10:18 AM

## 2011-03-16 NOTE — Consult Note (Signed)
  NAMEMarland Kitchen  VARUN, JOURDAN NO.:  [id number]  MEDICAL RECORD NO.:  [id number]  LOCATION:  3030                         FACILITY:  [facility]  PHYSICIAN:  Stefani Dama, M.D.  DATE OF BIRTH:  03-19-1927  DATE OF CONSULTATION:  03/04/2011 DATE OF DISCHARGE:                                CONSULTATION   REQUESTING PHYSICIAN:  [facility], Isidor Holts, MD  REASON FOR REQUEST:  Right parietal intracerebral hemorrhage.  HISTORY OF PRESENT ILLNESS:  Mr. Jhovany Weidinger is an 77 year old individual who noted that he was not feeling quite well.  He states that he had trouble negotiating the rooms in his house and difficulty knowing where he was and where he wants to get to.  Because of this, he was seen in the emergency department, and CT scan of the brain was performed, which demonstrated presence of a right parietal subependymal hemorrhage near the region of the thalamus and atrium of the ventricle.  The patient had a small amount of intraventricular blood on that right posterior horn.  The patient notes that his level of consciousness has been good.  His speech and thought processes have been well; however, he seems to suffer from some spatial disorientation.  His past medical history is notable for history of atrial fibrillation with Coumadin anticoagulation.  At the time of admission, the patient's INR was 3.7. The patient notes that he has been falling frequently.  SOCIAL HISTORY:  The patient lives independently.  He has been functioning independently, but he has been falling with increasing frequency.  PHYSICAL EXAMINATION:  He is alert, oriented, and cooperative individual.  Pupils are 3 mm, briskly reactive to light and accommodation.  Extraocular movements are full.  Face is symmetric. Tongue and uvula are in the midline.  Sclerae and conjunctivae are clear.  There is no evidence of a cortical drift at this time.  Move his upper and his lower extremities  quite well.  Reflexes are 2+ in the biceps, trace in the triceps, absent in the patellae and the Achilles, but the Babinski are equivocal bilaterally.  IMPRESSION:  The patient has a intracerebral hemorrhage that measures approximately 2.5 cm in diameters in the subependymal region near the thalamus and the atrium of the right ventricle.  Because the patient has significant atrophy, there is no significant mass effect from this lesion.  The patient's anticoagulation is now being reversed with fresh frozen plasma.  As his neurologic status has been stable, I have suggested a followup CT scan in a day's time. If this remains unchanged, I believe that the patient should stay off Coumadin, can be treated conservatively, particularly in regards with this history of frequent falls.  I am hopeful that this process will resolve itself without need for surgical intervention.     Stefani Dama, M.D.     Merla Riches  D:  03/04/2011  T:  03/05/2011  Job:  [id number]  Electronically Signed by Barnett Abu M.D. on 03/16/2011 07:10:25 AM

## 2011-04-25 ENCOUNTER — Ambulatory Visit (HOSPITAL_BASED_OUTPATIENT_CLINIC_OR_DEPARTMENT_OTHER): Payer: Medicare Other

## 2011-04-26 ENCOUNTER — Encounter (HOSPITAL_BASED_OUTPATIENT_CLINIC_OR_DEPARTMENT_OTHER): Payer: Medicare Other

## 2011-10-31 ENCOUNTER — Inpatient Hospital Stay (HOSPITAL_COMMUNITY)
Admission: EM | Admit: 2011-10-31 | Discharge: 2011-11-06 | DRG: 640 | Disposition: A | Discharge status: Skilled Nursing Facility | Payer: Medicare Other | Attending: Internal Medicine | Admitting: Internal Medicine

## 2011-10-31 ENCOUNTER — Encounter (HOSPITAL_COMMUNITY): Payer: Self-pay

## 2011-10-31 DIAGNOSIS — N400 Enlarged prostate without lower urinary tract symptoms: Secondary | ICD-10-CM | POA: Diagnosis present

## 2011-10-31 DIAGNOSIS — I4729 Other ventricular tachycardia: Secondary | ICD-10-CM | POA: Diagnosis present

## 2011-10-31 DIAGNOSIS — I509 Heart failure, unspecified: Secondary | ICD-10-CM | POA: Diagnosis present

## 2011-10-31 DIAGNOSIS — K219 Gastro-esophageal reflux disease without esophagitis: Secondary | ICD-10-CM | POA: Diagnosis present

## 2011-10-31 DIAGNOSIS — I10 Essential (primary) hypertension: Secondary | ICD-10-CM | POA: Diagnosis present

## 2011-10-31 DIAGNOSIS — I872 Venous insufficiency (chronic) (peripheral): Secondary | ICD-10-CM | POA: Diagnosis present

## 2011-10-31 DIAGNOSIS — I5032 Chronic diastolic (congestive) heart failure: Secondary | ICD-10-CM

## 2011-10-31 DIAGNOSIS — E871 Hypo-osmolality and hyponatremia: Secondary | ICD-10-CM

## 2011-10-31 DIAGNOSIS — F29 Unspecified psychosis not due to a substance or known physiological condition: Secondary | ICD-10-CM

## 2011-10-31 DIAGNOSIS — G9341 Metabolic encephalopathy: Secondary | ICD-10-CM | POA: Diagnosis present

## 2011-10-31 DIAGNOSIS — K449 Diaphragmatic hernia without obstruction or gangrene: Secondary | ICD-10-CM | POA: Diagnosis present

## 2011-10-31 DIAGNOSIS — I472 Ventricular tachycardia, unspecified: Secondary | ICD-10-CM | POA: Diagnosis present

## 2011-10-31 DIAGNOSIS — I4891 Unspecified atrial fibrillation: Secondary | ICD-10-CM | POA: Diagnosis present

## 2011-10-31 DIAGNOSIS — L259 Unspecified contact dermatitis, unspecified cause: Secondary | ICD-10-CM | POA: Diagnosis present

## 2011-10-31 DIAGNOSIS — R1310 Dysphagia, unspecified: Secondary | ICD-10-CM | POA: Diagnosis present

## 2011-10-31 DIAGNOSIS — R4182 Altered mental status, unspecified: Secondary | ICD-10-CM

## 2011-10-31 HISTORY — DX: Gastro-esophageal reflux disease without esophagitis: K21.9

## 2011-10-31 HISTORY — DX: Unspecified acquired deformity of unspecified lower leg: M21.969

## 2011-10-31 HISTORY — DX: Essential (primary) hypertension: I10

## 2011-10-31 HISTORY — DX: Unspecified atrial fibrillation: I48.91

## 2011-10-31 HISTORY — DX: Heart failure, unspecified: I50.9

## 2011-10-31 HISTORY — DX: Unspecified osteoarthritis, unspecified site: M19.90

## 2011-10-31 HISTORY — DX: Cellulitis, unspecified: L03.90

## 2011-10-31 HISTORY — DX: Hypokalemia: E87.6

## 2011-10-31 LAB — URINALYSIS, ROUTINE W REFLEX MICROSCOPIC
Leukocytes, UA: NEGATIVE
Nitrite: NEGATIVE
Specific Gravity, Urine: 1.016 (ref 1.005–1.030)
pH: 7 (ref 5.0–8.0)

## 2011-10-31 LAB — BASIC METABOLIC PANEL
BUN: 11 mg/dL (ref 6–23)
BUN: 11 mg/dL (ref 6–23)
CO2: 28 mEq/L (ref 19–32)
Calcium: 7.9 mg/dL — ABNORMAL LOW (ref 8.4–10.5)
Chloride: 78 mEq/L — ABNORMAL LOW (ref 96–112)
Chloride: 80 mEq/L — ABNORMAL LOW (ref 96–112)
Creatinine, Ser: 0.53 mg/dL (ref 0.50–1.35)
GFR calc Af Amer: >90 mL/min (ref >90)
GFR calc Af Amer: >90 mL/min (ref >90)
Potassium: 4 mEq/L (ref 3.5–5.1)

## 2011-10-31 LAB — CBC
Hemoglobin: 10.3 g/dL — ABNORMAL LOW (ref 13.0–17.0)
MCHC: 35.2 g/dL (ref 30.0–36.0)
RBC: 3.49 MIL/uL — ABNORMAL LOW (ref 4.22–5.81)
WBC: 7.3 10*3/uL (ref 4.0–10.5)

## 2011-10-31 LAB — DIFFERENTIAL
Basophils Relative: 0 % (ref 0–1)
Lymphocytes Relative: 13 % (ref 12–46)
Monocytes Relative: 8 % (ref 3–12)
Neutro Abs: 5.6 10*3/uL (ref 1.7–7.7)
Neutrophils Relative %: 77 % (ref 43–77)

## 2011-10-31 LAB — POCT I-STAT TROPONIN I: Troponin i, poc: 0 ng/mL (ref 0.00–0.08)

## 2011-10-31 MED ORDER — OMEGA-3-ACID ETHYL ESTERS 1 G PO CAPS
2.0000 g | ORAL_CAPSULE | Freq: Every day | ORAL | Status: DC
  Administered 2011-10-31 – 2011-11-06 (×5): 2 g via ORAL
  Filled 2011-10-31 (×7): qty 2

## 2011-10-31 MED ORDER — ACETAMINOPHEN 650 MG RE SUPP: 650.0000 mg | Freq: Four times a day (QID) | RECTAL | Status: DC | PRN

## 2011-10-31 MED ORDER — LOSARTAN POTASSIUM 50 MG PO TABS
50.0000 mg | ORAL_TABLET | Freq: Every day | ORAL | Status: DC
  Administered 2011-10-31 – 2011-11-04 (×5): 50 mg via ORAL
  Filled 2011-10-31 (×5): qty 1

## 2011-10-31 MED ORDER — PANTOPRAZOLE SODIUM 40 MG PO TBEC
40.0000 mg | DELAYED_RELEASE_TABLET | Freq: Every day | ORAL | Status: DC
  Administered 2011-10-31 – 2011-11-06 (×7): 40 mg via ORAL
  Filled 2011-10-31 (×7): qty 1

## 2011-10-31 MED ORDER — ONDANSETRON HCL 4 MG PO TABS: 4.0000 mg | ORAL_TABLET | Freq: Four times a day (QID) | ORAL | Status: DC | PRN

## 2011-10-31 MED ORDER — LORAZEPAM 2 MG/ML IJ SOLN
1.0000 mg | Freq: Once | INTRAMUSCULAR | Status: AC
  Administered 2011-10-31: 1 mg via INTRAVENOUS
  Filled 2011-10-31: qty 1

## 2011-10-31 MED ORDER — HYDROXYZINE HCL 10 MG PO TABS
10.0000 mg | ORAL_TABLET | Freq: Every day | ORAL | Status: DC
  Administered 2011-10-31 – 2011-11-05 (×6): 10 mg via ORAL
  Filled 2011-10-31 (×7): qty 1

## 2011-10-31 MED ORDER — DUTASTERIDE 0.5 MG PO CAPS
0.5000 mg | ORAL_CAPSULE | Freq: Every day | ORAL | Status: DC
  Administered 2011-10-31 – 2011-11-06 (×7): 0.5 mg via ORAL
  Filled 2011-10-31 (×7): qty 1

## 2011-10-31 MED ORDER — SODIUM CHLORIDE 0.9 % IV SOLN
INTRAVENOUS | Status: DC
  Administered 2011-10-31 – 2011-11-02 (×3): via INTRAVENOUS

## 2011-10-31 MED ORDER — ACETAMINOPHEN 325 MG PO TABS
650.0000 mg | ORAL_TABLET | Freq: Four times a day (QID) | ORAL | Status: DC | PRN
  Administered 2011-11-05: 650 mg via ORAL
  Filled 2011-10-31: qty 2

## 2011-10-31 MED ORDER — CARVEDILOL 25 MG PO TABS
25.0000 mg | ORAL_TABLET | Freq: Two times a day (BID) | ORAL | Status: DC
  Administered 2011-11-01 – 2011-11-04 (×7): 25 mg via ORAL
  Filled 2011-10-31 (×9): qty 1

## 2011-10-31 MED ORDER — ONDANSETRON HCL 4 MG/2ML IJ SOLN: 4.0000 mg | Freq: Four times a day (QID) | INTRAMUSCULAR | Status: DC | PRN

## 2011-10-31 MED ORDER — SODIUM CHLORIDE 0.9 % IV BOLUS (SEPSIS)
500.0000 mL | Freq: Once | INTRAVENOUS | Status: AC
  Administered 2011-10-31: 500 mL via INTRAVENOUS

## 2011-10-31 MED ORDER — OMEGA-3 FATTY ACIDS 1000 MG PO CAPS: 2.0000 g | ORAL_CAPSULE | Freq: Every day | ORAL | Status: DC

## 2011-10-31 MED ORDER — SENNA-DOCUSATE SODIUM 8.6-50 MG PO TABS
1.0000 | ORAL_TABLET | Freq: Every day | ORAL | Status: DC
  Administered 2011-10-31 – 2011-11-05 (×6): 1 via ORAL
  Filled 2011-10-31 (×7): qty 1

## 2011-10-31 MED ORDER — POLYETHYLENE GLYCOL 3350 17 G PO PACK
17.0000 g | PACK | Freq: Every day | ORAL | Status: DC
  Administered 2011-10-31 – 2011-11-06 (×4): 17 g via ORAL
  Filled 2011-10-31 (×7): qty 1

## 2011-10-31 MED ORDER — SODIUM CHLORIDE 0.9 % IV SOLN
INTRAVENOUS | Status: DC
  Administered 2011-10-31: 22:00:00 via INTRAVENOUS

## 2011-10-31 MED ORDER — ENOXAPARIN SODIUM 40 MG/0.4ML ~~LOC~~ SOLN
40.0000 mg | SUBCUTANEOUS | Status: DC
  Administered 2011-10-31 – 2011-11-05 (×6): 40 mg via SUBCUTANEOUS
  Filled 2011-10-31 (×7): qty 0.4

## 2011-10-31 MED ORDER — OCUVITE-LUTEIN PO CAPS
1.0000 | ORAL_CAPSULE | Freq: Every day | ORAL | Status: DC
  Administered 2011-10-31 – 2011-11-06 (×7): 1 via ORAL
  Filled 2011-10-31 (×7): qty 1

## 2011-10-31 MED ORDER — OXYBUTYNIN CHLORIDE 5 MG PO TABS
5.0000 mg | ORAL_TABLET | Freq: Every day | ORAL | Status: DC
  Administered 2011-10-31 – 2011-11-06 (×7): 5 mg via ORAL
  Filled 2011-10-31 (×7): qty 1

## 2011-10-31 MED ORDER — SODIUM CHLORIDE 0.9 % IJ SOLN
3.0000 mL | Freq: Two times a day (BID) | INTRAMUSCULAR | Status: DC
  Administered 2011-10-31 – 2011-11-04 (×3): 3 mL via INTRAVENOUS

## 2011-10-31 NOTE — ED Notes (Signed)
ZOX:[id number]<AV> Expected date:10/31/11<BR> Expected time: 1:27 PM<BR> Means of arrival:<BR> Comments:<BR> Jackson from [facility].  Hyperglycemia.

## 2011-10-31 NOTE — ED Notes (Signed)
Attempted second time to call report. Receiving nurse to call back.

## 2011-10-31 NOTE — ED Provider Notes (Signed)
History     CSN: [id number]  Arrival date & time 10/31/11  1343   First MD Initiated Contact with Patient 10/31/11 1512      Chief Complaint  Patient presents with  . Weakness    hyponatrium-  Na-118    (Consider location/radiation/quality/duration/timing/severity/associated sxs/prior treatment) HPI This 78 year old male is sent from the nursing home due to hyponatremia with sodium level 118 today. Patient apparently has had some confusion over the last couple of weeks off and on and is currently oriented to person and place as well as the day of the week but does not know the month. He does know the year. The patient himself has no complaints. Patient himself has generalized weakness at baseline and feels that way today. At baseline the patient usually transfers he had a wheelchair. Patient has had no headache neck pain back pain chest pain shortness breath abdominal pain vomiting bloody stools lateralizing weakness or other concerns. The patient has been running a sodium level in the low 120s over the last few weeks but has had his Lasix increased from 20 mg daily to 40 mg daily for the last few days. The patient's chronic edema to his legs with stasis dermatitis has been improving apparently over the last few days. Patient however has developed some confusion with sodium level decreasing below 120 so was sent to the ED for evaluation. Past Medical History  Diagnosis Date  . Cellulitis   . Arthritis   . GERD (gastroesophageal reflux disease)   . Hypokalemia   . Hypertension   . Atrial fibrillation   . CHF (congestive heart failure)   . Ankle deformity     broke right ankle and it "was set" no surgery  anemia, general weakness (uses wheelchair) Hx ICH Past Surgical History  Procedure Date  . Knee surgery age 9    broke knee cap in [facility] - doesn't remember which knee    Family History  Problem Relation Age of Onset  . Family history unknown: Yes    History  Substance Use  Topics  . Smoking status: Never Smoker   . Smokeless tobacco: Never Used  . Alcohol Use: Yes     occasional wine      Review of Systems  Unable to perform ROS: Mental status change    Allergies  Review of patient's allergies indicates no known allergies.  Home Medications   No current outpatient prescriptions on file.  BP 102/82  Pulse 66  Temp 98 F (36.7 C) (Oral)  Resp 20  Ht 5\' 8"  (1.727 m)  Wt 168 lb 10.4 oz (76.5 kg)  BMI 25.64 kg/m2  SpO2 96%  Physical Exam  Nursing note and vitals reviewed. Constitutional:       Awake, alert, nontoxic appearance with baseline speech for patient.  HENT:  Head: Atraumatic.  Mouth/Throat: No oropharyngeal exudate.  Eyes: EOM are normal. Pupils are equal, round, and reactive to light. Right eye exhibits no discharge. Left eye exhibits no discharge.  Neck: Neck supple.  Cardiovascular: Normal rate and regular rhythm.   No murmur heard. Pulmonary/Chest: Effort normal and breath sounds normal. No stridor. No respiratory distress. He has no wheezes. He has no rales. He exhibits no tenderness.  Abdominal: Soft. Bowel sounds are normal. He exhibits no mass. There is no tenderness. There is no rebound.  Musculoskeletal: He exhibits edema. He exhibits no tenderness.       Baseline ROM, moves extremities with no obvious new focal weakness. Trace edema  bilateral lower legs with stasis dermatitis without obvious secondary cellulitis.  Lymphadenopathy:    He has no cervical adenopathy.  Neurological: He is alert.       Awake, alert, cooperative but did not know why he was sent to the ED for evaluation, he is oriented to person and place but not quite to time as noted above; motor strength 4/5 bilaterally; sensation normal to light touch bilaterally; peripheral visual fields full to confrontation; no facial asymmetry; tongue midline; major cranial nerves appear intact; no pronator drift, normal finger to nose bilaterally  Skin: No rash noted.    Psychiatric: He has a normal mood and affect.    ED Course  Procedures (including critical care time) ECG: Atrial fibrillation, ventricular rate 76, normal axis, incomplete right bundle branch block, no acute ischemic changes noted, no significant change compared with October 2012 Labs Reviewed  CBC - Abnormal; Notable for the following:    RBC 3.49 (*)     Hemoglobin 10.3 (*)     HCT 29.3 (*)     All other components within normal limits  BASIC METABOLIC PANEL - Abnormal; Notable for the following:    Sodium 115 (*)     Chloride 78 (*)     Glucose, Bld 105 (*)     Calcium 8.2 (*)     All other components within normal limits  URINALYSIS, ROUTINE W REFLEX MICROSCOPIC - Abnormal; Notable for the following:    APPearance CLOUDY (*)     All other components within normal limits  PRO B NATRIURETIC PEPTIDE - Abnormal; Notable for the following:    Pro B Natriuretic peptide (BNP) 5129.0 (*)     All other components within normal limits  BASIC METABOLIC PANEL - Abnormal; Notable for the following:    Sodium 116 (*)     Chloride 80 (*)     Glucose, Bld 183 (*)     Calcium 7.9 (*)     All other components within normal limits  COMPREHENSIVE METABOLIC PANEL - Abnormal; Notable for the following:    Sodium 116 (*)     Chloride 79 (*)     Calcium 7.9 (*)     Total Protein 5.8 (*)     Albumin 2.8 (*)     All other components within normal limits  CBC - Abnormal; Notable for the following:    RBC 3.25 (*)     Hemoglobin 9.5 (*)     HCT 27.5 (*)     All other components within normal limits  PROTIME-INR - Abnormal; Notable for the following:    Prothrombin Time 15.3 (*)     All other components within normal limits  MRSA PCR SCREENING - Abnormal; Notable for the following:    MRSA by PCR POSITIVE (*)     All other components within normal limits  BASIC METABOLIC PANEL - Abnormal; Notable for the following:    Sodium 117 (*)     Chloride 80 (*)     Creatinine, Ser 0.47 (*)      Calcium 8.1 (*)     All other components within normal limits  BASIC METABOLIC PANEL - Abnormal; Notable for the following:    Chloride 82 (*)     Glucose, Bld 142 (*)     Creatinine, Ser 0.49 (*)     Calcium 7.7 (*)     All other components within normal limits  DIFFERENTIAL  PROTIME-INR  POCT I-STAT TROPONIN I  TSH  APTT  BASIC METABOLIC PANEL  SODIUM, URINE, RANDOM  OSMOLALITY, URINE  OSMOLALITY   No results found.   1. Hyponatremia   2. Altered mental status       MDM  Patient / Family / Caregiver understand and agree with initial ED impression and plan with expectations set for ED visit.Pt stable in ED with no significant deterioration in condition.Patient / Family / Caregiver informed of clinical course, understand medical decision-making process, and agree with plan. The patient appears reasonably stabilized for admission considering the current resources, flow, and capabilities available in the ED at this time, and I doubt any other [facility] requiring further screening and/or treatment in the ED prior to admission.        Hurman Horn, MD 11/01/11 [phone]

## 2011-10-31 NOTE — ED Notes (Signed)
Per EMS-Patient is a resident of [address]. Patient has a sodium level-118. Patient c/o weakness.

## 2011-10-31 NOTE — H&P (Signed)
[facility] History and Physical  DARELD MCAULIFFE ZOX:[id number] DOB: 31-Mar-1927 DOA: 10/31/2011   PCP: Florentina Jenny, MD   Chief Complaint: sent in from SNF for hyponatremia.   HPI:  78 year old male with h/o chronic hyponatremia as per the family at bedside, CHF, Atrial fibrillation, Hypertension was brought in for worsening of hyponatremia. The value at SNF was 118. On arrival to ED  His NA level was 115. He was found to be slightly confused when compared to baseline mental status. He also reports shortness of breath. Occasional cough. Non productive. No fever or chills. No nausea or vomiting or abdominal pain. No chest pain or shortness of breath. No headache or blurry vision. He is being admitted to hospitalist service for evaluation and management of hyponatremia.  Review of Systems:  Negative except for HPI.   Past Medical History  Diagnosis Date  . Cellulitis   . Arthritis   . GERD (gastroesophageal reflux disease)   . Hypokalemia   . Hypertension   . Atrial fibrillation   . CHF (congestive heart failure)   . Ankle deformity     broke right ankle and it "was set" no surgery   Past Surgical History  Procedure Date  . Knee surgery age 44    broke knee cap in [facility] - doesn't remember which knee   Social History:  reports that he has never smoked. He has never used smokeless tobacco. He reports that he drinks alcohol. He reports that he does not use illicit drugs.  No Known Allergies  Family History  Problem Relation Age of Onset  . Family history unknown: Yes    Prior to Admission medications   Medication Sig Start Date End Date Taking? Authorizing Provider  carvedilol (COREG) 25 MG tablet Take 25 mg by mouth 2 (two) times daily with a meal.   Yes Historical Provider, MD  dutasteride (AVODART) 0.5 MG capsule Take 0.5 mg by mouth daily.   Yes Historical Provider, MD  fish oil-omega-3 fatty acids 1000 MG capsule Take 2 g by mouth daily.   Yes Historical Provider, MD    furosemide (LASIX) 40 MG tablet Take 40 mg by mouth daily.   Yes Historical Provider, MD  hydrOXYzine (ATARAX/VISTARIL) 10 MG tablet Take 10 mg by mouth at bedtime.   Yes Historical Provider, MD  losartan (COZAAR) 50 MG tablet Take 50 mg by mouth daily.   Yes Historical Provider, MD  Multiple Vitamins-Minerals (DECUBI-VITE PO) Take 1 tablet by mouth daily.   Yes Historical Provider, MD  multivitamin-lutein (OCUVITE-LUTEIN) CAPS Take 1 capsule by mouth daily.   Yes Historical Provider, MD  oxybutynin (DITROPAN) 5 MG tablet Take 5 mg by mouth daily.   Yes Historical Provider, MD  pantoprazole (PROTONIX) 40 MG tablet Take 40 mg by mouth daily.   Yes Historical Provider, MD  polyethylene glycol (MIRALAX / GLYCOLAX) packet Take 17 g by mouth daily.   Yes Historical Provider, MD  sennosides-docusate sodium (SENOKOT-S) 8.6-50 MG tablet Take 1 tablet by mouth at bedtime.   Yes Historical Provider, MD   Physical Exam: Filed Vitals:   10/31/11 1530 10/31/11 1600 10/31/11 1630 10/31/11 1700  BP: 122/83 119/66 129/87 132/86  Pulse: 66 81 83 84  Temp:      TempSrc:      Resp: 19     SpO2: 98% 97% 97% 97%   Constitutional: Vital signs reviewed.  Patient is a well-developed and well-nourished  in no acute distress and cooperative with exam.  Aler but slightly confused.  Head: Normocephalic and atraumatic Mouth: no erythema or exudates, dry MM Eyes: PERRL, EOMI, conjunctivae normal, No scleral icterus.  Neck: Supple, Trachea midline normal ROM, No JVD, mass, thyromegaly, or carotid bruit present.  Cardiovascular: RRR, S1 normal, S2 normal, no MRG, pulses symmetric and intact bilaterally Pulmonary/Chest: CTAB, no wheezes, rales, or rhonchi Abdominal: Soft. Non-tender, non-distended, bowel sounds are normal, no masses, organomegaly, or guarding present.  Musculoskeletal:chronic venous stasis changes bilateral lower extremities. Trace edema.  Hematology: no cervical, inginal, or axillary adenopathy.   Neurological: A&O 2 Strenght is normal and symmetric bilaterally, no focal motor deficit, sensory intact to light touch bilaterally.  Skin: Warm, dry and intact. No rash, cyanosis, or clubbing.  .     Labs on Admission:  Basic Metabolic Panel:  Lab 10/31/11 1610  NA 115*  K 4.0  CL 78*  CO2 29  GLUCOSE 105*  BUN 11  CREATININE 0.54  CALCIUM 8.2*  MG --  PHOS --   Liver Function Tests: No results found for this basename: AST:5,ALT:5,ALKPHOS:5,BILITOT:5,PROT:5,ALBUMIN:5 in the last 168 hours No results found for this basename: LIPASE:5,AMYLASE:5 in the last 168 hours No results found for this basename: AMMONIA:5 in the last 168 hours CBC:  Lab 10/31/11 1450  WBC 7.3  NEUTROABS 5.6  HGB 10.3*  HCT 29.3*  MCV 84.0  PLT 353   Cardiac Enzymes: No results found for this basename: CKTOTAL:5,CKMB:5,CKMBINDEX:5,TROPONINI:5 in the last 168 hours BNP: No components found with this basename: POCBNP:5 CBG: No results found for this basename: GLUCAP:5 in the last 168 hours  Radiological Exams on Admission: No results found.  EKG: pending.  Assessment/Plan    1. Hyponatremia: patient has chronic hyponatremia which runs around 120. In the last two weeks, he was fluid overloaded and his lasix has been doubled for appropriate diuresis and a BMP today at snf showed a NA level of 118. Probably overdiuresis. Will hold lasix and give him gentle hydration for 12 hours with normal saline at 133ml/hr for 12 hours followed hy 75ml /hr.  Q8HR BMP's. Neuro checks.  2. CHF: chronic diastolic heart failure. He is slightly dehydrated. We will hold lasix and give him gentle hydration.  3. Atrial fibrillation: rate controlled. Not on anticoagulation secondary to h/o intracranial bleed.  4. Hypertension: controlled. 5. Shortness of breath: will get a cxr , but he is saturating 98% on 1 lit of nasal canula oxygen.     Kathlen Mody, MD  [facility] Pager [phone]  If  7PM-7AM, please contact night-coverage [email or web address] Password [email or web address] 10/31/2011, 6:01 PM

## 2011-10-31 NOTE — ED Notes (Signed)
Attempted to call report to receiving nurse. Receiving nurse states she will call back that she was in the blood bank.

## 2011-10-31 NOTE — Progress Notes (Signed)
Pt on telemetry and having > 15 beats of PVC's per minute. Pt able to be aroused but falls back to sleep shortly after. Not staying awake long enough to drink fluids. Pt speaks clearly when awake. Grip moderate Bilat.  Na level 116 . NS running at 40ml/hr. TRH on call made aware of NA level and PVC's.

## 2011-10-31 NOTE — ED Notes (Signed)
Pt complaining of difficulty breathing. O2 sats 97% on room air. Sat pt up in bed. Nurse notified of situation.

## 2011-11-01 DIAGNOSIS — F29 Unspecified psychosis not due to a substance or known physiological condition: Secondary | ICD-10-CM

## 2011-11-01 DIAGNOSIS — I5032 Chronic diastolic (congestive) heart failure: Secondary | ICD-10-CM

## 2011-11-01 DIAGNOSIS — E871 Hypo-osmolality and hyponatremia: Secondary | ICD-10-CM

## 2011-11-01 LAB — COMPREHENSIVE METABOLIC PANEL
BUN: 11 mg/dL (ref 6–23)
CO2: 28 mEq/L (ref 19–32)
Calcium: 7.9 mg/dL — ABNORMAL LOW (ref 8.4–10.5)
Chloride: 79 mEq/L — ABNORMAL LOW (ref 96–112)
Creatinine, Ser: 0.55 mg/dL (ref 0.50–1.35)
GFR calc Af Amer: >90 mL/min (ref >90)
GFR calc non Af Amer: >90 mL/min (ref >90)
Total Bilirubin: 0.5 mg/dL (ref 0.3–1.2)

## 2011-11-01 LAB — PROTIME-INR
INR: 1.18 (ref 0.00–1.49)
Prothrombin Time: 15.3 seconds — ABNORMAL HIGH (ref 11.6–15.2)

## 2011-11-01 LAB — CBC
Hemoglobin: 9.5 g/dL — ABNORMAL LOW (ref 13.0–17.0)
Platelets: 310 10*3/uL (ref 150–400)
RBC: 3.25 MIL/uL — ABNORMAL LOW (ref 4.22–5.81)
WBC: 8.8 10*3/uL (ref 4.0–10.5)

## 2011-11-01 LAB — BASIC METABOLIC PANEL
CO2: 27 mEq/L (ref 19–32)
Calcium: 8.1 mg/dL — ABNORMAL LOW (ref 8.4–10.5)
Calcium: 7.7 mg/dL — ABNORMAL LOW (ref 8.4–10.5)
Creatinine, Ser: 0.47 mg/dL — ABNORMAL LOW (ref 0.50–1.35)
GFR calc Af Amer: >90 mL/min (ref >90)
GFR calc Af Amer: >90 mL/min (ref >90)
GFR calc non Af Amer: >90 mL/min (ref >90)
GFR calc non Af Amer: >90 mL/min (ref >90)
Glucose, Bld: 142 mg/dL — ABNORMAL HIGH (ref 70–99)
Sodium: 116 mEq/L — CL (ref 135–145)

## 2011-11-01 LAB — APTT: aPTT: 36 seconds (ref 24–37)

## 2011-11-01 LAB — MRSA PCR SCREENING: MRSA by PCR: POSITIVE — AB

## 2011-11-01 MED ORDER — ENSURE COMPLETE PO LIQD
237.0000 mL | Freq: Two times a day (BID) | ORAL | Status: DC
  Administered 2011-11-01 – 2011-11-03 (×3): 237 mL via ORAL

## 2011-11-01 MED ORDER — FUROSEMIDE 40 MG PO TABS
40.0000 mg | ORAL_TABLET | Freq: Every day | ORAL | Status: DC
  Administered 2011-11-01 – 2011-11-03 (×3): 40 mg via ORAL
  Filled 2011-11-01 (×3): qty 1

## 2011-11-01 NOTE — Progress Notes (Signed)
[facility] PROGRESS NOTE  [name] ZOX:[id number] DOB: 12-Aug-1926 DOA: 10/31/2011 PCP: Florentina Jenny, MD  Brief narrative: 78 year old male with h/o chronic hyponatremia as per the family at bedside, CHF, Atrial fibrillation, Hypertension was brought in for worsening of hyponatremia. The value at SNF was 118. On arrival to ED His NA level was 115. He was found to be slightly confused when compared to baseline mental status. He also reports shortness of breath. Occasional cough. Non productive. No fever or chills. No nausea or vomiting or abdominal pain. No chest pain or shortness of breath. No headache or blurry vision. He is being admitted to hospitalist service for evaluation and management of hyponatremia.   Consultants:  None  HPI/Subjective: no new complaints.   Objective: Filed Vitals:   11/01/11 0559 11/01/11 0933 11/01/11 1445 11/01/11 1916  BP: 123/79 126/68 128/83   Pulse: 77  75   Temp: 99 F (37.2 C)  98.4 F (36.9 C)   TempSrc: Oral  Oral   Resp: 20  20   Height: 5\' 8"  (1.727 m)     Weight:    76.5 kg (168 lb 10.4 oz)  SpO2: 98%  97%    No intake or output data in the 24 hours ending 11/01/11 1953  Exam:   General:  Alert afebrile comfortable, still slightly confused.   Cardiovascular: s1s2 normal  Respiratory: decreased at bedside.  Abdomen: soft NDNT BS+  Data Reviewed: Basic Metabolic Panel:  Lab 11/01/11 8119 11/01/11 0505 10/31/11 1845 10/31/11 1450  NA 117* 116* 116* 115*  K 3.7 4.1 4.0 4.0  CL 80* 79* 80* 78*  CO2 27 28 28 29   GLUCOSE 85 89 183* 105*  BUN 10 11 11 11   CREATININE 0.47* 0.55 0.53 0.54  CALCIUM 8.1* 7.9* 7.9* 8.2*  MG -- -- -- --  PHOS -- -- -- --   Liver Function Tests:  Lab 11/01/11 0505  AST 18  ALT 21  ALKPHOS 71  BILITOT 0.5  PROT 5.8*  ALBUMIN 2.8*   No results found for this basename: LIPASE:5,AMYLASE:5 in the last 168 hours No results found for this basename: AMMONIA:5 in the last 168  hours CBC:  Lab 11/01/11 0505 10/31/11 1450  WBC 8.8 7.3  NEUTROABS -- 5.6  HGB 9.5* 10.3*  HCT 27.5* 29.3*  MCV 84.6 84.0  PLT 310 353   Cardiac Enzymes: No results found for this basename: CKTOTAL:5,CKMB:5,CKMBINDEX:5,TROPONINI:5 in the last 168 hours BNP: No components found with this basename: POCBNP:5 CBG: No results found for this basename: GLUCAP:5 in the last 168 hours  Recent Results (from the past 240 hour(s))  MRSA PCR SCREENING     Status: Abnormal   Collection Time   11/01/11  2:39 AM      Component Value Range Status Comment   MRSA by PCR POSITIVE (*) NEGATIVE Final      Studies: No results found.  Scheduled Meds:   . carvedilol  25 mg Oral BID WC  . dutasteride  0.5 mg Oral Daily  . enoxaparin  40 mg Subcutaneous Q24H  . feeding supplement  237 mL Oral BID BM  . hydrOXYzine  10 mg Oral QHS  . losartan  50 mg Oral Daily  . multivitamin-lutein  1 capsule Oral Daily  . omega-3 acid ethyl esters  2 g Oral Daily  . oxybutynin  5 mg Oral Daily  . pantoprazole  40 mg Oral Daily  . polyethylene glycol  17 g Oral Daily  .  sennosides-docusate sodium  1 tablet Oral QHS  . sodium chloride  3 mL Intravenous Q12H  . DISCONTD: fish oil-omega-3 fatty acids  2 g Oral Daily   Continuous Infusions:   . sodium chloride 125 mL/hr at 10/31/11 1459  . DISCONTD: sodium chloride 75 mL/hr at 10/31/11 2156     Assessment/Plan: 1. 1.  Hyponatremia: patient has chronic hyponatremia which runs around 120. In the last two weeks, he was fluid overloaded and his lasix has been doubled for appropriate diuresis and a BMP today at snf showed a NA level of 118. Will get work up for SIADH. TSH is normal. Random cortisol level ordered. Urine sodium , urine osmolality, serum osmolarity pending.  2. CHF: chronic diastolic heart failure. Resume lasix.  3. Atrial fibrillation: rate controlled. Not on anticoagulation secondary to h/o intracranial bleed.  4. Hypertension:  controlled. 5. Shortness of breath: will get a cxr , but he is saturating 98% on RA.  Code Status: DNR  Confirmed with POA today  Family Communication: Discussed in detail with the POA at bedside Disposition Plan: possible d.c to SNF when serum sodium improves.    Kathlen Mody, MD  [facility] Pager [phone]  If 7PM-7AM, please contact night-coverage [email or web address] Password [email or web address] 11/01/2011, 7:53 PM   LOS: 1 day

## 2011-11-01 NOTE — Evaluation (Signed)
Physical Therapy Evaluation Patient Details Name: [name] MRN: [id number] DOB: 05-Jun-1926 Today's Date: 11/01/2011 Time: 2841-3244 PT Time Calculation (min): 23 min  PT Assessment / Plan / Recommendation Clinical Impression  Pt presents with hyponatremia with decreased strength, endurance and mobility.  Mental status mainly WFL, however pt somewhat agitated and noted to repeat several comments.  Tolerated ambulation in room with pt stating he is fearful of falling.  He also states he does not usually walk very much at [name] and gets around mostly with w/c.  Pt will benefit from skilled PT in acute venue to address deficits.  PT recommends pt return to SNF for follow up therapy at D/C to increase pt safety.     PT Assessment  Patient needs continued PT services    Follow Up Recommendations  Skilled nursing facility    Barriers to Discharge None      lEquipment Recommendations  Defer to next venue    Recommendations for Other Services     Frequency Min 3X/week    Precautions / Restrictions Precautions Precautions: Fall Required Braces or Orthoses: Other Brace/Splint Other Brace/Splint: velcro shoes for possible charcot deformity Restrictions Weight Bearing Restrictions: No   Pertinent Vitals/Pain Some pain, but mainly just general malaise      Mobility  Bed Mobility Bed Mobility: Not assessed Supine to Sit: 4: Min assist Details for Bed Mobility Assistance: Pt was in recliner when PT arrived.  Transfers Transfers: Sit to Stand;Stand to Sit Sit to Stand: 4: Min assist;3: Mod assist;With upper extremity assist;With armrests;From chair/3-in-1 Stand to Sit: 4: Min guard;With upper extremity assist;With armrests;To chair/3-in-1 Details for Transfer Assistance: Assist to rise and steady once on feet.  cues for hand placement and safety when sitting/standing.  Ambulation/Gait Ambulation/Gait Assistance: 4: Min assist Ambulation Distance (Feet): 20 Feet Assistive  device: Rolling walker Ambulation/Gait Assistance Details: Pt only agreeable to ambulate in room.  Cues for sequencing/technique with RW and to maintain position inside of RW.  L foot difficult to maintain inside of RW due to toe out position of foot.   Gait Pattern: Step-through pattern;Decreased stride length;Trunk flexed;Narrow base of support (B feet are toed out (L>R)) Gait velocity: decreased General Gait Details: Pt anxious about being up stating that he has had several falls and has hit his head.  Stairs: No Wheelchair Mobility Wheelchair Mobility: No    Exercises     PT Diagnosis: Difficulty walking;Abnormality of gait;Generalized weakness;Acute pain  PT Problem List: Decreased strength;Decreased activity tolerance;Decreased balance;Decreased mobility;Decreased coordination;Decreased knowledge of use of DME;Decreased cognition;Decreased skin integrity PT Treatment Interventions: DME instruction;Gait training;Functional mobility training;Therapeutic activities;Therapeutic exercise;Balance training;Patient/family education   PT Goals Acute Rehab PT Goals PT Goal Formulation: With patient Time For Goal Achievement: 11/15/11 Potential to Achieve Goals: Fair Pt will go Supine/Side to Sit: with supervision PT Goal: Supine/Side to Sit - Progress: Goal set today Pt will go Sit to Supine/Side: with supervision PT Goal: Sit to Supine/Side - Progress: Goal set today Pt will go Sit to Stand: with supervision PT Goal: Sit to Stand - Progress: Goal set today Pt will go Stand to Sit: with supervision PT Goal: Stand to Sit - Progress: Goal set today Pt will Ambulate: 16 - 50 feet;with supervision;with least restrictive assistive device PT Goal: Ambulate - Progress: Goal set today  Visit Information  Last PT Received On: 11/01/11 Assistance Needed: +2 (for safety if available)    Subjective Data  Subjective: I don't feel good at all, but do what you  need to Patient Stated Goal: to get  better   Prior Functioning  Home Living Lives With: Other (Comment) [name] Available Help at Discharge: Other (Comment) (staff) Prior Function Level of Independence: Needs assistance Communication Communication: No difficulties Dominant Hand: Right    Cognition  Overall Cognitive Status: Appears within functional limits for tasks assessed/performed (Pt with low sodium, mostly WFL) Arousal/Alertness: Awake/alert Orientation Level: Appears intact for tasks assessed Behavior During Session: [name] for tasks performed Cognition - Other Comments: Somewhat agitated    Extremity/Trunk Assessment Right Upper Extremity Assessment RUE ROM/Strength/Tone: Within functional levels Left Upper Extremity Assessment LUE ROM/Strength/Tone: Within functional levels Right Lower Extremity Assessment RLE ROM/Strength/Tone: WFL for tasks assessed (Greater than or equal to 3/5) RLE Coordination: WFL - gross motor Left Lower Extremity Assessment LLE ROM/Strength/Tone: WFL for tasks assessed (Greater than or equal to 3/5) LLE Coordination: WFL - gross motor Trunk Assessment Trunk Assessment: Kyphotic   Balance    End of Session PT - End of Session Equipment Utilized During Treatment: Gait belt Activity Tolerance: Patient limited by fatigue Patient left: in chair;with call bell/phone within reach Nurse Communication: Mobility status;Other (comment) (Request for assist with meal)   Page, [name] 11/01/2011, 12:18 PM

## 2011-11-01 NOTE — Progress Notes (Signed)
Clinical Social Work Department BRIEF PSYCHOSOCIAL ASSESSMENT 11/01/2011  Patient:  [name], [name]     Account Number:  [id number]     Admit date:  10/31/2011  Clinical Social Worker:  Doroteo Glassman  Date/Time:  11/01/2011 03:46 PM  Referred by:  Physician  Date Referred:  11/01/2011 Referred for  SNF Placement   Other Referral:   Interview type:  Patient Other interview type:   Pt's cousin, who is POA, present for Ax.    PSYCHOSOCIAL DATA Living Status:  FACILITY Admitted from facility:  [facility] Level of care:  Skilled Nursing Facility Primary support name:  [name] Primary support relationship to patient:  FAMILY Degree of support available:   Strong    CURRENT CONCERNS Current Concerns  Post-Acute Placement   Other Concerns:    SOCIAL WORK ASSESSMENT / PLAN Met with Pt and his cousin, who is also his POA, [name], re: d/c plans.    Pt from [facility] and would like to return.  Pt and cousin express frustration, though, re: Pt's roommate at [facility], as this person is unintentionally disruptive and Pt has difficulty resting.    Encouraged Pt, POA and other family members to meet with [facility] CSW to discuss concerns.    Spoke with [name], Admission Coordinator at [facility]. Per [name], facility ready and willing to accept Pt back.   Assessment/plan status:  Psychosocial Support/Ongoing Assessment of Needs Other assessment/ plan:   Information/referral to community resources:    PATIENT'S/FAMILY'S RESPONSE TO PLAN OF CARE: Pt and family thanked CSW for time and assistance.  CSW to continue to follow.  Providence Crosby, LCSWA Clinical Social Work [phone]

## 2011-11-01 NOTE — Evaluation (Signed)
Occupational Therapy Evaluation Patient Details Name: [name] MRN: [id number] DOB: 1926/08/23 Today's Date: 11/01/2011 Time: 4098-1191 OT Time Calculation (min): 40 min  OT Assessment / Plan / Recommendation Clinical Impression  This 78 year old man was admitted from [facility] with hyponatremia and AMS.  He will benefit from skilled OT in acute to increase safety and activity tolerance to maximize particiaption in ADLs with supervision level goals in acute    OT Assessment  Patient needs continued OT Services    Follow Up Recommendations  Skilled nursing facility    Barriers to Discharge      Equipment Recommendations  Defer to next venue    Recommendations for Other Services    Frequency  Min 1X/week    Precautions / Restrictions Precautions Precautions: Fall Required Braces or Orthoses: Other Brace/Splint (velcro shoes:  ?charcoat deformity) Other Brace/Splint: velcro shoes for possible charcot deformity Restrictions Weight Bearing Restrictions: No   Pertinent Vitals/Pain No pain.  Dizzy after standing for several minutes, seated BP 126/68    ADL  Eating/Feeding: Simulated;Set up Where Assessed - Eating/Feeding: Chair Grooming: Performed;Wash/dry face;Set up Where Assessed - Grooming: Unsupported sitting Upper Body Bathing: Simulated;Supervision/safety Where Assessed - Upper Body Bathing: Unsupported sitting Lower Body Bathing: Simulated;Moderate assistance Where Assessed - Lower Body Bathing: Supported sit to stand Upper Body Dressing: Simulated;Minimal assistance (lines) Where Assessed - Upper Body Dressing: Unsupported sitting Lower Body Dressing: Simulated;Maximal assistance Where Assessed - Lower Body Dressing: Sopported sit to stand Toilet Transfer: Performed;Minimal assistance Toilet Transfer Method: Stand pivot Toilet Transfer Equipment: Bedside commode Toileting - Clothing Manipulation and Hygiene: Performed;Maximal assistance Where Assessed -  Engineer, mining and Hygiene: Standing ADL Comments: Pt slow to initiate eating:  at first said he was hungry, and then said he wasn't.  Had several phone calls.  Asked each person to bring him denture adhesive    OT Diagnosis: Generalized weakness  OT Problem List: Decreased strength;Decreased activity tolerance;Impaired balance (sitting and/or standing);Decreased cognition OT Treatment Interventions: Self-care/ADL training;DME and/or AE instruction;Therapeutic activities;Patient/family education;Balance training   OT Goals Acute Rehab OT Goals OT Goal Formulation: With patient (generally agreeable) Time For Goal Achievement: 11/15/11 Potential to Achieve Goals: Good ADL Goals Pt Will Perform Grooming: with supervision;Standing at sink Pt Will Transfer to Toilet: with supervision;Ambulation;3-in-1 Pt Will Perform Toileting - Hygiene: with supervision;Sit to stand from 3-in-1/toilet  Visit Information  Assistance Needed: +1    Subjective Data  Subjective: "I need some denture adhesive" Patient Stated Goal: none stated   Prior Functioning  Home Living Lives With: Other (Comment) (from [facility]) Available Help at Discharge:  (staff) Prior Function Level of Independence: Needs assistance Communication Communication: No difficulties Dominant Hand: Right    Cognition  Overall Cognitive Status: Impaired (pt has low Na+--mostly WFLs) Arousal/Alertness: Awake/alert Orientation Level: Appears intact for tasks assessed Behavior During Session: [facility] for tasks performed    Extremity/Trunk Assessment Right Upper Extremity Assessment RUE ROM/Strength/Tone: Within functional levels Left Upper Extremity Assessment LUE ROM/Strength/Tone: Within functional levels Right Lower Extremity Assessment RLE ROM/Strength/Tone: WFL for tasks assessed (Greater than or equal to 3/5) RLE Coordination: WFL - gross motor Left Lower Extremity Assessment LLE ROM/Strength/Tone: WFL for  tasks assessed (Greater than or equal to 3/5) LLE Coordination: WFL - gross motor Trunk Assessment Trunk Assessment: Kyphotic   Mobility Bed Mobility Bed Mobility: Supine to Sit Supine to Sit: 4: Min assist Details for Bed Mobility Assistance: Pt was in recliner when PT arrived.  Transfers Transfers: Sit to Stand  Sit to Stand: 4: Min assist;From bed;With upper extremity assist Stand to Sit: 4: Min guard;With upper extremity assist;With armrests;To chair/3-in-1 Details for Transfer Assistance: Assist to rise and steady once on feet.  cues for hand placement and safety when sitting/standing.    Exercise    Balance    End of Session OT - End of Session Equipment Utilized During Treatment: Gait belt Activity Tolerance: Patient tolerated treatment well Patient left: in chair;with call bell/phone within reach   [facility] 11/01/2011, 1:48 PM Marica Otter, OTR/L [phone] 11/01/2011

## 2011-11-01 NOTE — Progress Notes (Signed)
CARE MANAGEMENT NOTE 11/01/2011  Patient:  [name], [name]   Account Number:  [id number]  Date Initiated:  11/01/2011  Documentation initiated by:  Payslie Mccaig  Subjective/Objective Assessment:   pt with history of hyponatremia na on admission 115.     Action/Plan:   snf   Anticipated DC Date:  11/04/2011   Anticipated DC Plan:  SKILLED NURSING FACILITY  In-house referral  NA      DC Planning Services  NA      [facility] Choice  NA   Choice offered to / List presented to:  NA   DME arranged  NA      DME agency  NA     HH arranged  NA      HH agency  NA   Status of service:  In process, will continue to follow Medicare Important Message given?  NA - LOS <3 / Initial given by admissions (If response is "NO", the following Medicare IM given date fields will be blank) Date Medicare IM given:   Date Additional Medicare IM given:    Discharge Disposition:    Per UR Regulation:  Reviewed for med. necessity/level of care/duration of stay  If discussed at Long Length of Stay Meetings, dates discussed:    Comments:  [id number] Marcelle Smiling, RN, BSN, CCM No discharge needs present at time of this review Case Management [phone]

## 2011-11-01 NOTE — Progress Notes (Signed)
INITIAL ADULT NUTRITION ASSESSMENT Date: 11/01/2011   Time: 3:34 PM Reason for Assessment: Nutrition risk, consult  ASSESSMENT: Male 78 y.o.  Dx: Hyponatremia   Food/Nutrition Related Hx: Pt admitted from [facility] with hyponatremia and altered mental status. No family present during visit, however pt was able to say that was not eating well PTA and has some problems swallowing food. Pt reports food sometimes gets stuck in his throat and he sometimes has to cough really hard to get it out. Recommend SLP evaluation r/t pt's c/o dysphagia. Pt without any teeth. Pt reports he was not on any nutritional supplements PTA. Pt unsure of usual weight, however noted pt's weight is up 11 pounds since admission in October of last year. Pt reports he did not eat lunch because he was not sitting up in his chair enough - notified RN. Unclear why pt on low sodium diet as pt with hyponatremia - recommend MD liberalize to regular diet if appropriate.   Hx:  Past Medical History  Diagnosis Date  . Cellulitis   . Arthritis   . GERD (gastroesophageal reflux disease)   . Hypokalemia   . Hypertension   . Atrial fibrillation   . CHF (congestive heart failure)   . Ankle deformity     broke right ankle and it "was set" no surgery   Related Meds:  Scheduled Meds:   . carvedilol  25 mg Oral BID WC  . dutasteride  0.5 mg Oral Daily  . enoxaparin  40 mg Subcutaneous Q24H  . hydrOXYzine  10 mg Oral QHS  . LORazepam  1 mg Intravenous Once  . losartan  50 mg Oral Daily  . multivitamin-lutein  1 capsule Oral Daily  . omega-3 acid ethyl esters  2 g Oral Daily  . oxybutynin  5 mg Oral Daily  . pantoprazole  40 mg Oral Daily  . polyethylene glycol  17 g Oral Daily  . sennosides-docusate sodium  1 tablet Oral QHS  . sodium chloride  500 mL Intravenous Once  . sodium chloride  3 mL Intravenous Q12H  . DISCONTD: fish oil-omega-3 fatty acids  2 g Oral Daily   Continuous Infusions:   . sodium chloride 125  mL/hr at 10/31/11 1459  . DISCONTD: sodium chloride 75 mL/hr at 10/31/11 2156   PRN Meds:.acetaminophen, acetaminophen, ondansetron (ZOFRAN) IV, ondansetron  Ht: 5\' 8"  (172.7 cm)  Wt: 161 lb 13.1 oz (73.4 kg)  Ideal Wt: 154 lb % Ideal Wt: 105  Usual Wt: 149.8 lb in October 2012 % Usual Wt: 107   Body mass index is 24.60 kg/(m^2).   Labs:  CMP     Component Value Date/Time   NA 116* 11/01/2011 0505   K 4.1 11/01/2011 0505   CL 79* 11/01/2011 0505   CO2 28 11/01/2011 0505   GLUCOSE 89 11/01/2011 0505   BUN 11 11/01/2011 0505   CREATININE 0.55 11/01/2011 0505   CALCIUM 7.9* 11/01/2011 0505   PROT 5.8* 11/01/2011 0505   ALBUMIN 2.8* 11/01/2011 0505   AST 18 11/01/2011 0505   ALT 21 11/01/2011 0505   ALKPHOS 71 11/01/2011 0505   BILITOT 0.5 11/01/2011 0505   GFRNONAA >90 11/01/2011 0505   GFRAA >90 11/01/2011 0505   No intake or output data in the 24 hours ending 11/01/11 1543  Last BM - PTA  Diet Order: Sodium Restricted   IVF:    sodium chloride Last Rate: 125 mL/hr at 10/31/11 1459  DISCONTD: sodium chloride Last Rate: 75 mL/hr  at 10/31/11 2156    Estimated Nutritional Needs:   Kcal:1825-2200 Protein:90-100g Fluid:1.8-2.2L  NUTRITION DIAGNOSIS: -Inadequate oral intake (NI-2.1).  Status: Ongoing  RELATED TO: altered mental status  AS EVIDENCE BY: <25% meal intake  MONITORING/EVALUATION(Goals): Pt to consume >90% of meals/supplements  EDUCATION NEEDS: -No education needs identified at this time  INTERVENTION: Ensure Complete BID. Diet per MD. Will monitor.   Dietitian #: [phone]  DOCUMENTATION CODES Per approved criteria  -Not Applicable    Marshall Cork 11/01/2011, 3:34 PM

## 2011-11-02 ENCOUNTER — Inpatient Hospital Stay (HOSPITAL_COMMUNITY): Payer: Medicare Other

## 2011-11-02 DIAGNOSIS — I5032 Chronic diastolic (congestive) heart failure: Secondary | ICD-10-CM

## 2011-11-02 DIAGNOSIS — R197 Diarrhea, unspecified: Secondary | ICD-10-CM

## 2011-11-02 DIAGNOSIS — R0602 Shortness of breath: Secondary | ICD-10-CM

## 2011-11-02 DIAGNOSIS — E871 Hypo-osmolality and hyponatremia: Secondary | ICD-10-CM

## 2011-11-02 LAB — BASIC METABOLIC PANEL
BUN: 8 mg/dL (ref 6–23)
BUN: 8 mg/dL (ref 6–23)
Calcium: 8.1 mg/dL — ABNORMAL LOW (ref 8.4–10.5)
Chloride: 82 mEq/L — ABNORMAL LOW (ref 96–112)
Creatinine, Ser: 0.51 mg/dL (ref 0.50–1.35)
Creatinine, Ser: 0.57 mg/dL (ref 0.50–1.35)
GFR calc Af Amer: >90 mL/min (ref >90)
GFR calc Af Amer: >90 mL/min (ref >90)
GFR calc Af Amer: >90 mL/min (ref >90)
GFR calc non Af Amer: >90 mL/min (ref >90)
GFR calc non Af Amer: >90 mL/min (ref >90)
GFR calc non Af Amer: >90 mL/min (ref >90)
Potassium: 3.2 mEq/L — ABNORMAL LOW (ref 3.5–5.1)
Potassium: 3.1 mEq/L — ABNORMAL LOW (ref 3.5–5.1)

## 2011-11-02 LAB — SODIUM, URINE, RANDOM: Sodium, Ur: 92 mEq/L

## 2011-11-02 LAB — OSMOLALITY, URINE: Osmolality, Ur: 244 mOsm/kg — ABNORMAL LOW (ref 390–1090)

## 2011-11-02 MED ORDER — POTASSIUM CHLORIDE CRYS ER 20 MEQ PO TBCR
40.0000 meq | EXTENDED_RELEASE_TABLET | ORAL | Status: AC
  Administered 2011-11-02 (×3): 40 meq via ORAL
  Filled 2011-11-02 (×3): qty 2

## 2011-11-02 NOTE — Progress Notes (Signed)
Speech Language Pathology Dysphagia Treatment Patient Details Name: [name] MRN: [id number] DOB: July 07, 1926 Today's Date: 11/02/2011 Time: 7829-5621 SLP Time Calculation (min): 15  Assessment / Plan / Recommendation Clinical Impression  Educated cousin, HCPOA per her request to results of today's BSE and previous UGI study.    Advised cousin and pt to general precautions - consuming small meals/day, drinking liquids throughout meal, strict reflux precautions, etc.  Also advised that cold items may induce spasms- as spasms were observed on UGI 2001.    Educated cousin and pt that pt compensates for dysphagia independently currently by masticating thoroughly and eating larger meals in am, but if pt  becomes significantly cognitively impaired and loses awareness to dysphagia, asp risk from esophageal issue will likely be significantly increased.    No further SLP indicated as all education completed.  Thanks for this referral.     Diet Recommendation    soft/thin-prefer water with meals   SLP Plan   No follow up.   Pertinent Vitals/Pain Afebrile, decr   Swallowing Goals   MET!  Pt's HCPOA verbalized results of previous UGI study and precautions with mod independence.  HCPOA requests copy of UGI report - asked her to speak to RN regarding this matter.   General Respiratory Status: Supplemental O2 delivered via (comment) Behavior/Cognition: Alert;Cooperative;Hard of hearing;Distractible Oral Cavity - Dentition: Dentures, top;Dentures, bottom (ill fitting per pt) Patient Positioning: Upright in bed    Dysphagia Treatment   All education completed.   Chales Abrahams 11/02/2011, 4:35 PM

## 2011-11-02 NOTE — Progress Notes (Signed)
As per lab, serum osmolarity 242 this am.  Page placed to Dr. Gwenlyn Perking. Will continue to monitor.

## 2011-11-02 NOTE — Progress Notes (Signed)
Patient's sodium 119 and potassium 3.2.  Dr. Gwenlyn Perking aware.  Awiating orders.

## 2011-11-02 NOTE — Clinical Documentation Improvement (Signed)
CHANGE MENTAL STATUS DOCUMENTATION CLARIFICATION   THIS DOCUMENT IS NOT A PERMANENT PART OF THE MEDICAL RECORD  TO RESPOND TO THE THIS QUERY, FOLLOW THE INSTRUCTIONS BELOW:  1. If needed, update documentation for the patient's encounter via the notes activity.  2. Access this query again and click edit on the In [employer].  3. After updating, or not, click F2 to complete all highlighted (required) fields concerning your review. Select "additional documentation in the medical record" OR "no additional documentation provided".  4. Click Sign note button.  5. The deficiency will fall out of your In Basket *Please let us know if you are not able to complete this workflow by phone or e-mail (listed below).         11/02/11  Dear Dr. Blake Divine Marton Redwood  In an effort to better capture your patient's severity of illness, reflect appropriate length of stay and utilization of resources, a review of the patient medical record has revealed the following indicators.    Based on your clinical judgment, please clarify and document in a progress note and/or discharge summary the clinical condition associated with the following supporting information:  In responding to this query please exercise your independent judgment.  The fact that a query is asked, does not imply that any particular answer is desired or expected. Pt admitted with hyponatremia and altered mental status.  ... For accurate  specificity & severity, if possible , please specify if "altered mental status  " be further clarified in progress notes as any of listings below. Thank you.  Possible Clinical Conditions?  _______Encephalopathy (describe type if known)                       Anoxic                       Septic                       Alcoholic                        Hepatic                       Hypertensive                       Metabolic                       Toxic  Drug induced confusion/delirium _______Other  Condition__________________ _______Cannot Clinically Determine   Supporting Information: Risk Factors: Hyponatremia and AMS  Signs & Symptoms:per ED notes: " has had some confusion over the last couple of weeks off and on "  "has developed some confusion with sodium level decreasing below 120 "  per H&P: "  found to be slightly confused when compared to baseline mental status." " Alert but slightly confused"  Lab:              11/02/11           6/18           6/18 Sodium  119    116   117    Treatment: NS @ 174ml/hr                    Monitoring  BMP  EKG/cardiac monitioring                    Neuro checks/  Fall precautions   Reviewed: additional documentation in the medical record  (GT) 11/06/11 D/c summary  Dr Gwenlyn Perking  Thank You,  Andy Gauss RN  Clinical Documentation Specialist:  Pager [phone] E-mail [email or web address]  Health Information Management [place]

## 2011-11-02 NOTE — Evaluation (Addendum)
Clinical/Bedside Swallow Evaluation Patient Details  Name: [name] MRN: [id number] Date of Birth: [birth year]  Today's Date: 11/02/2011 Time: 4098-1191 SLP Time Calculation (min): 43 min  Past Medical History:  Past Medical History  Diagnosis Date  . Cellulitis   . Arthritis   . GERD (gastroesophageal reflux disease)   . Hypokalemia   . Hypertension   . Atrial fibrillation   . CHF (congestive heart failure)   . Ankle deformity     broke right ankle and it "was set" no surgery   Past Surgical History:  Past Surgical History  Procedure Date  . Knee surgery age 43    broke knee cap in [facility] - doesn't remember which knee   HPI:  78 yo male adm to [facility] with AMS found to have hyponatremia, CHF, HTN, Afib.  Pt with + shortness of breath - non productive cough.  CXR negative for pna, but shows large hiatal hernia.     Assessment / Plan / Recommendation Clinical Impression  Pt presents with suspected primary esophageal dysphagia from his large hiatal hernia -with nearly entire stomach and portion of colon being in chest.  Pt underwent an UGI in 2001 that showed mild GE reflux, large hiatal hernia with nearly entire stomach being in chest and spasm of mid and distal esophagus.  Per pt, history of choking on [place] in 1974 or 1964 resulted in ED visit, pt was reportedly given muscle relaxant that eased choking sensation.  Per pt, swallow abiilty is normal and intake is poor due to food tasting bad and "feeling sick"after eating.  Pt observed to clear his throat throughout snack, a single cough noted at completion of snack.  Oropharyngeal swallow likely normal with esophageal deficits.  Rec thin liquids and soft foods due to pt report of pain with mastication from ill fitting dentures.  Advised pt to general precautions - consuming small meals/day, drinking liquids throughout meal, strict reflux precautions, etc.  Pt compensates for his dysphagia independently by masticating thoroughly and  eating larger meals in am.  If pt  becomes significantly cognitively impaired and loses awareness to dysphagia, asp risk from esophageal issue will likely be signficantly increased.    Aspiration Risk  Moderate    Diet Recommendation Dysphagia 3 (Mechanical Soft);Thin liquid (secondary to ill fitting dentition - pain with swallow)   Supervision: Patient able to self feed Compensations: Slow rate;Small sips/bites (drink liquids throughout meal-prefer water) Postural Changes and/or Swallow Maneuvers: Upright 30-60 min after meal;Seated upright 90 degrees    Other  Recommendations Oral Care Recommendations: Oral care QID   Follow Up Recommendations  None    Frequency and Duration   x1/week     Pertinent Vitals/Pain Afebrile, decr    SLP Swallow Goals  Pt and Hcpoa will acknowledge results of previous UGI study in 2001 and precautions to esophogeal precautions maximize swallow efficiency/safety with min assist.    Swallow Study Prior Functional Status       General Date of Onset: 11/02/11 HPI: 78 yo male [facility] with [facility]MS found to have hyponatremia, CHF, HTN, Afib.  Pt with + shortness of breath - non productive cough.  CXR negative for pna, but shows large hiatal hernia.   Previous Swallow Assessment: none Diet Prior to this Study: Thin liquids;Regular Respiratory Status: Supplemental O2 delivered via (comment) History of Recent Intubation: No Behavior/Cognition: Alert;Cooperative;Hard of hearing;Distractible Oral Cavity - Dentition: Dentures, top;Dentures, bottom (ill fitting per pt) Self-Feeding Abilities: Able to feed self Patient Positioning:  Upright in bed Baseline Vocal Quality: Clear Volitional Cough: Strong Volitional Swallow: Able to elicit    Oral/Motor/Sensory Function Overall Oral Motor/Sensory Function: Appears within functional limits for tasks assessed   Ice Chips Ice chips: Not tested   Thin Liquid Thin Liquid: Within functional limits Pharyngeal  Phase  Impairments: Throat Clearing - Immediate;Throat Clearing - Delayed Other Comments: pt consumed 3 ounces of water sequentially, intermittent throat clear noted    Nectar Thick Nectar Thick Liquid: Impaired Pharyngeal Phase Impairments: Throat Clearing - Immediate   Honey Thick Honey Thick Liquid: Not tested   Puree Puree: Impaired Presentation: Self Fed;Spoon Pharyngeal Phase Impairments: Throat Clearing - Immediate   Solid Solid: Impaired Presentation: Self Fed Pharyngeal Phase Impairments: Throat Clearing - Immediate    Donavan Burnet, MS [facility] SLP [phone]

## 2011-11-02 NOTE — Progress Notes (Signed)
Subjective: No CP, no SOB; feeling better and less confuse.  Objective: Vital signs in last 24 hours: Temp:  [98 F (36.7 C)-98.3 F (36.8 C)] 98 F (36.7 C) (06/19 1428) Pulse Rate:  [64-91] 91  (06/19 1428) Resp:  [20] 20  (06/19 1428) BP: (102-138)/(82-89) 123/82 mmHg (06/19 1428) SpO2:  [96 %-100 %] 100 % (06/19 1428) Weight:  [73.4 kg (161 lb 13.1 oz)-76.5 kg (168 lb 10.4 oz)] 73.4 kg (161 lb 13.1 oz) (06/19 0654) Weight change: 3.1 kg (6 lb 13.4 oz)    Intake/Output from previous day: 06/18 0701 - 06/19 0700 In: 1250 [I.V.:1250] Out: -  Total I/O In: -  Out: 400 [Urine:400]   Physical Exam: General: Alert, awake, oriented x2, in no acute distress. HEENT: No bruits, no goiter. Heart: s1 and s2 Lungs: Clear to auscultation bilaterally. Abdomen: Soft, nontender, nondistended, positive bowel sounds. Extremities: No clubbing cyanosis or edema with positive pedal pulses. Neuro: Grossly intact, nonfocal.   Lab Results: Basic Metabolic Panel:  Basename 11/02/11 1400 11/02/11 0515  NA 118* 119*  K 3.1* 3.2*  CL 80* 82*  CO2 29 29  GLUCOSE 136* 96  BUN 8 8  CREATININE 0.51 0.58  CALCIUM 7.8* 7.9*  MG -- --  PHOS -- --   Liver Function Tests:  [facility] 11/01/11 0505  AST 18  ALT 21  ALKPHOS 71  BILITOT 0.5  PROT 5.8*  ALBUMIN 2.8*   CBC:  Basename 11/01/11 0505 10/31/11 1450  WBC 8.8 7.3  NEUTROABS -- 5.6  HGB 9.5* 10.3*  HCT 27.5* 29.3*  MCV 84.6 84.0  PLT 310 353   BNP:  Basename 10/31/11 1450  PROBNP 5129.0*   Thyroid Function Tests:  Basename 10/31/11 2106  TSH 1.686  T4TOTAL --  FREET4 --  T3FREE --  THYROIDAB --   Coagulation:  Basename 11/01/11 0505 10/31/11 1450  LABPROT 15.3* 15.0  INR 1.18 1.16   Urinalysis:  Basename 10/31/11 1504  COLORURINE YELLOW  LABSPEC 1.016  PHURINE 7.0  GLUCOSEU NEGATIVE  HGBUR NEGATIVE  BILIRUBINUR NEGATIVE  KETONESUR NEGATIVE  PROTEINUR NEGATIVE  UROBILINOGEN 0.2  NITRITE NEGATIVE    LEUKOCYTESUR NEGATIVE   Misc. Labs:  Recent Results (from the past 240 hour(s))  MRSA PCR SCREENING     Status: Abnormal   Collection Time   11/01/11  2:39 AM      Component Value Range Status Comment   MRSA by PCR POSITIVE (*) NEGATIVE Final     Studies/Results: Dg Chest 2 View  11/02/2011  *RADIOLOGY REPORT*  Clinical Data: Shortness of breath.  Controlled hypertension. History of congestive heart failure.  Former smoker.  CHEST - 2 VIEW  Comparison: 03/03/2011.  11/03/2010. CT 08/22/2010.  Findings: There is stable cardiac silhouette enlargement. Ectasia and nonaneurysmal calcification of the thoracic aorta are seen.  On previous CT a large hiatal hernia was demonstrated with the majority the stomach and a large portion of the transverse colon in the lower chest.  There appears to be of similar configuration on the current examination with bibasilar atelectasis.  No consolidation or pleural effusion is demonstrated.  There is osteopenic appearance of the bones with changes of degenerative disc disease and degenerative spondylosis. Old healed rib trauma is present.  IMPRESSION: Stable cardiac silhouette enlargement.  On previous CT a large hiatal hernia was demonstrated with the majority the stomach and a large portion of the transverse colon in the lower chest.  There appears to be of similar configuration on the  current examination.  There is an element of bibasilar atelectasis associated with the large hiatal hernia.  No consolidation or pleural effusion is seen. Chronic vascular and bony findings are stable and are detailed above.  Original Report Authenticated By: Crawford Givens, M.D.    Medications: Scheduled Meds:   . carvedilol  25 mg Oral BID WC  . dutasteride  0.5 mg Oral Daily  . enoxaparin  40 mg Subcutaneous Q24H  . feeding supplement  237 mL Oral BID BM  . furosemide  40 mg Oral Daily  . hydrOXYzine  10 mg Oral QHS  . losartan  50 mg Oral Daily  . multivitamin-lutein  1  capsule Oral Daily  . omega-3 acid ethyl esters  2 g Oral Daily  . oxybutynin  5 mg Oral Daily  . pantoprazole  40 mg Oral Daily  . polyethylene glycol  17 g Oral Daily  . potassium chloride  40 mEq Oral Q4H  . sennosides-docusate sodium  1 tablet Oral QHS  . sodium chloride  3 mL Intravenous Q12H   Continuous Infusions:   . sodium chloride 10 mL/hr at 11/02/11 1049   PRN Meds:.acetaminophen, acetaminophen, ondansetron (ZOFRAN) IV, ondansetron  Assessment/Plan: 1-Hyponatremia: per results appears to be 2/2 to SIADH. Will continue lasix; supportive care and will start fluid restriction therapy.BMET in am.  2-CHF: continue lasix. No rales on exam and no SOB.  3-Hypokalemia: due to diuretics; will replete.  4-Dysphagia: due to hiatal hernia; no dysmotility found on bed side evaluation by speech therapy. Will follow their recommendations and instructions. Dys 3 thin liquids.  5- SOB: stable; CXR w/o findings of infiltrates. Will titrate oxygen to RA.  6-HTN: well controlled; continue current regimen.   LOS: 2 days   Xoie Kreuser [facility] [phone]  11/02/2011, 4:52 PM

## 2011-11-03 DIAGNOSIS — I5032 Chronic diastolic (congestive) heart failure: Secondary | ICD-10-CM

## 2011-11-03 DIAGNOSIS — R197 Diarrhea, unspecified: Secondary | ICD-10-CM

## 2011-11-03 DIAGNOSIS — R0602 Shortness of breath: Secondary | ICD-10-CM

## 2011-11-03 DIAGNOSIS — E871 Hypo-osmolality and hyponatremia: Secondary | ICD-10-CM

## 2011-11-03 LAB — BASIC METABOLIC PANEL
Calcium: 8.1 mg/dL — ABNORMAL LOW (ref 8.4–10.5)
Chloride: 83 mEq/L — ABNORMAL LOW (ref 96–112)
Creatinine, Ser: 0.51 mg/dL (ref 0.50–1.35)
GFR calc Af Amer: >90 mL/min (ref >90)
GFR calc Af Amer: >90 mL/min (ref >90)
GFR calc Af Amer: >90 mL/min (ref >90)
GFR calc non Af Amer: >90 mL/min (ref >90)
GFR calc non Af Amer: >90 mL/min (ref >90)
Potassium: 4.3 mEq/L (ref 3.5–5.1)
Potassium: 3.8 mEq/L (ref 3.5–5.1)
Sodium: 118 mEq/L — CL (ref 135–145)
Sodium: 118 mEq/L — CL (ref 135–145)

## 2011-11-03 LAB — CORTISOL: Cortisol, Plasma: 19.6 ug/dL

## 2011-11-03 MED ORDER — SODIUM CHLORIDE 0.9 % IV BOLUS (SEPSIS)
500.0000 mL | Freq: Once | INTRAVENOUS | Status: AC
  Administered 2011-11-03: 500 mL via INTRAVENOUS

## 2011-11-03 MED ORDER — SODIUM CHLORIDE 0.9 % IV SOLN
INTRAVENOUS | Status: DC
  Administered 2011-11-03 – 2011-11-04 (×4): via INTRAVENOUS
  Administered 2011-11-05: 1000 mL via INTRAVENOUS
  Administered 2011-11-05: via INTRAVENOUS
  Administered 2011-11-05: 1000 mL via INTRAVENOUS

## 2011-11-03 NOTE — Progress Notes (Signed)
Subjective: No CP, no SOB; feeling less confuse; he was AAOX2 and able to follow commands properly. Experienced 1.6 second pause; completely asymptomatic.  Objective: Vital signs in last 24 hours: Temp:  [98 F (36.7 C)-98.3 F (36.8 C)] 98.3 F (36.8 C) (06/20 1300) Pulse Rate:  [86-95] 86  (06/20 1300) Resp:  [19-20] 20  (06/20 1300) BP: (119-131)/(82-88) 125/88 mmHg (06/20 1300) SpO2:  [95 %-100 %] 96 % (06/20 1300) Weight change:  Last BM Date: 11/02/11  Intake/Output from previous day: 06/19 0701 - 06/20 0700 In: -  Out: 2500 [Urine:2500]     Physical Exam: General: Alert, awake, oriented x2, in no acute distress. HEENT: No bruits, no goiter. Heart: s1 and s2 Lungs: Clear to auscultation bilaterally. Abdomen: Soft, nontender, nondistended, positive bowel sounds. Extremities: No clubbing cyanosis or edema with positive pedal pulses. Neuro: Grossly intact, nonfocal.   Lab Results: Basic Metabolic Panel:  Basename 11/03/11 0640 11/02/11 2204  NA 118* 118*  K 4.0 4.2  CL 80* 82*  CO2 28 28  GLUCOSE 99 115*  BUN 7 8  CREATININE 0.52 0.57  CALCIUM 8.1* 8.1*  MG -- --  PHOS -- --   Liver Function Tests:  Basename 11/01/11 0505  AST 18  ALT 21  ALKPHOS 71  BILITOT 0.5  PROT 5.8*  ALBUMIN 2.8*   CBC:  Basename 11/01/11 0505  WBC 8.8  NEUTROABS --  HGB 9.5*  HCT 27.5*  MCV 84.6  PLT 310   Thyroid Function Tests:  Basename 10/31/11 2106  TSH 1.686  T4TOTAL --  FREET4 --  T3FREE --  THYROIDAB --   Coagulation:  Basename 11/01/11 0505  LABPROT 15.3*  INR 1.18   Misc. Labs:  Recent Results (from the past 240 hour(s))  MRSA PCR SCREENING     Status: Abnormal   Collection Time   11/01/11  2:39 AM      Component Value Range Status Comment   MRSA by PCR POSITIVE (*) NEGATIVE Final     Studies/Results: Dg Chest 2 View  11/02/2011  *RADIOLOGY REPORT*  Clinical Data: Shortness of breath.  Controlled hypertension. History of congestive  heart failure.  Former smoker.  CHEST - 2 VIEW  Comparison: 03/03/2011.  11/03/2010. CT 08/22/2010.  Findings: There is stable cardiac silhouette enlargement. Ectasia and nonaneurysmal calcification of the thoracic aorta are seen.  On previous CT a large hiatal hernia was demonstrated with the majority the stomach and a large portion of the transverse colon in the lower chest.  There appears to be of similar configuration on the current examination with bibasilar atelectasis.  No consolidation or pleural effusion is demonstrated.  There is osteopenic appearance of the bones with changes of degenerative disc disease and degenerative spondylosis. Old healed rib trauma is present.  IMPRESSION: Stable cardiac silhouette enlargement.  On previous CT a large hiatal hernia was demonstrated with the majority the stomach and a large portion of the transverse colon in the lower chest.  There appears to be of similar configuration on the current examination.  There is an element of bibasilar atelectasis associated with the large hiatal hernia.  No consolidation or pleural effusion is seen. Chronic vascular and bony findings are stable and are detailed above.  Original Report Authenticated By: Crawford Givens, M.D.    Medications: Scheduled Meds:    . carvedilol  25 mg Oral BID WC  . dutasteride  0.5 mg Oral Daily  . enoxaparin  40 mg Subcutaneous Q24H  . feeding supplement  237 mL Oral BID BM  . hydrOXYzine  10 mg Oral QHS  . losartan  50 mg Oral Daily  . multivitamin-lutein  1 capsule Oral Daily  . omega-3 acid ethyl esters  2 g Oral Daily  . oxybutynin  5 mg Oral Daily  . pantoprazole  40 mg Oral Daily  . polyethylene glycol  17 g Oral Daily  . potassium chloride  40 mEq Oral Q4H  . sennosides-docusate sodium  1 tablet Oral QHS  . sodium chloride  500 mL Intravenous Once  . sodium chloride  3 mL Intravenous Q12H  . DISCONTD: furosemide  40 mg Oral Daily   Continuous Infusions:    . sodium chloride 10  mL/hr at 11/02/11 1049  . sodium chloride     PRN Meds:.acetaminophen, acetaminophen, ondansetron (ZOFRAN) IV, ondansetron  Assessment/Plan: 1-Hyponatremia: per results appears to be 2/2 to SIADH. Will consult nephrology and follow their rec's. Per discussion plan is to restart normal saline at 125 cc/hr; repeat urine and serum osmolality and if failure to improve will use hypertonic saline 3%. Will also discontinue lasix and check cortisol level. BMET every 8 hours.  2-hx of CHF: volume status is on the dry side; will hold on lasix while attempting to correct his sodium. No rales on exam and no SOB. Will follow daily weight and close I's and O's  3-Hypokalemia: due to diuretics; repleted.  4-Dysphagia: due to hiatal hernia; no dysmotility found on bed side evaluation by speech therapy. Will follow their recommendations and instructions. Dys 3 thin liquids.  5- SOB: stable; CXR w/o findings of infiltrates. Will titrate oxygen to RA.  6-HTN: well controlled; continue current regimen except for lasix, which will be discontinue.   LOS: 3 days   Jilliana Burkes [facility] [phone]  11/03/2011, 3:13 PM

## 2011-11-03 NOTE — Progress Notes (Signed)
CRITICAL VALUE ALERT  Critical value received:  Sodium 118  Date of notification:  11/03/11  Time of notification:  2350  Critical value read back:yes  Nurse who received alert:  S.Young,RN  MD notified (1st page):  MD already aware of value,previous value the same  Time of first page:    MD notified (2nd page):  Time of second page:  Responding MD:   Time MD responded:

## 2011-11-03 NOTE — Progress Notes (Signed)
Telemetry monitor showing 1.6 second pause.  Page placed to Dr. Gwenlyn Perking. Patient sleeping at this time.  Will continue to monitor.

## 2011-11-03 NOTE — Consult Note (Signed)
[name] 11/03/2011 Delano Metz D Requesting Physician:  Dr. Gwenlyn Perking  Reason for Consult:  Hyponatremia HPI: The patient is a 78 y.o. year-old WM with hx of HTN, afib and DJD who presented on 6/17 w mild confusion and hyponatremia.  Serum Na was 115.  He received normal saline bolus and then 75cc/hr for about 24 hours with some improvement to 119. IVF's were stopped and sodium is down to 118.  He was on po lasix and an ARB at home which have both been held.  UA was negative and urine Na was 92, Uosm was 244.  He 's not sure why he's on lasix.  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     Past Medical History:  Past Medical History  Diagnosis Date  . Cellulitis   . Arthritis   . GERD (gastroesophageal reflux disease)   . Hypokalemia   . Hypertension   . Atrial fibrillation   . CHF (congestive heart failure)   . Ankle deformity     broke right ankle and it "was set" no surgery    Past Surgical History:  Past Surgical History  Procedure Date  . Knee surgery age 73    broke knee cap in [facility] - doesn't remember which knee    Family History:  Family History  Problem Relation Age of Onset  . Family history unknown: Yes   Social History:  reports that he has never smoked. He has never used smokeless tobacco. He reports that he drinks alcohol. He reports that he does not use illicit drugs.  Allergies: No Known Allergies  Home medications: Prior to Admission medications   Medication Sig Start Date End Date Taking? Authorizing Provider  carvedilol (COREG) 25 MG tablet Take 25 mg by mouth 2 (two) times daily with a meal.   Yes Historical Provider, MD  dutasteride (AVODART) 0.5 MG capsule Take 0.5 mg by mouth daily.   Yes Historical Provider, MD  fish oil-omega-3 fatty acids 1000 MG capsule Take 2 g by mouth daily.   Yes Historical Provider, MD  furosemide (LASIX) 40 MG  tablet Take 40 mg by mouth daily.   Yes Historical Provider, MD  hydrOXYzine (ATARAX/VISTARIL) 10 MG tablet Take 10 mg by mouth at bedtime.   Yes Historical Provider, MD  losartan (COZAAR) 50 MG tablet Take 50 mg by mouth daily.   Yes Historical Provider, MD  Multiple Vitamins-Minerals (DECUBI-VITE PO) Take 1 tablet by mouth daily.   Yes Historical Provider, MD  multivitamin-lutein (OCUVITE-LUTEIN) CAPS Take 1 capsule by mouth daily.   Yes Historical Provider, MD  oxybutynin (DITROPAN) 5 MG tablet Take 5 mg by mouth daily.   Yes Historical Provider, MD  pantoprazole (PROTONIX) 40 MG tablet Take 40 mg by mouth daily.   Yes Historical Provider, MD  polyethylene glycol (MIRALAX / GLYCOLAX) packet Take 17 g by mouth daily.   Yes Historical Provider, MD  sennosides-docusate sodium (SENOKOT-S) 8.6-50 MG tablet Take 1 tablet by mouth at bedtime.   Yes Historical Provider, MD    Inpatient medications:    . carvedilol  25 mg Oral BID WC  . dutasteride  0.5 mg Oral Daily  . enoxaparin  40 mg Subcutaneous Q24H  . feeding supplement  237 mL Oral BID BM  . furosemide  40 mg Oral Daily  . hydrOXYzine  10 mg Oral QHS  . losartan  50 mg Oral Daily  . multivitamin-lutein  1 capsule Oral Daily  . omega-3 acid ethyl esters  2 g Oral Daily  . oxybutynin  5 mg Oral Daily  . pantoprazole  40 mg Oral Daily  . polyethylene glycol  17 g Oral Daily  . potassium chloride  40 mEq Oral Q4H  . sennosides-docusate sodium  1 tablet Oral QHS  . sodium chloride  3 mL Intravenous Q12H    Review of Systems Gen:  Denies headache, fever, chills, sweats.  No weight loss. HEENT:  No visual change, sore throat, difficulty swallowing. Resp:  No difficulty breathing, DOE.  No cough or hemoptysis. Cardiac:  No chest pain, orthopnea, PND.  Denies edema. GI:   Denies abdominal pain.   No nausea, vomiting, diarrhea.  No constipation. GU:  Denies difficulty or change in voiding.  No change in urine color.     MS:  Denies  joint pain or swelling.   Derm:  Denies skin rash or itching.  No chronic skin conditions.  Neuro:   Denies focal weakness, memory problems, hx stroke or TIA.   Psych:  Denies symptoms of depression of anxiety.  No hallucination.    Labs: Basic Metabolic Panel:  Lab 11/03/11 1610 11/02/11 2204 11/02/11 1400 11/02/11 0515 11/01/11 2215 11/01/11 1455 11/01/11 0505  NA 118* 118* 118* 119* 116* 117* 116*  K 4.0 4.2 3.1* 3.2* 3.9 3.7 4.1  CL 80* 82* 80* 82* 82* 80* 79*  CO2 28 28 29 29 27 27 28   GLUCOSE 99 115* 136* 96 142* 85 89  BUN 7 8 8 8 10 10 11   CREATININE 0.52 0.57 0.51 0.58 0.49* 0.47* 0.55  ALB -- -- -- -- -- -- --  CALCIUM 8.1* 8.1* 7.8* 7.9* 7.7* 8.1* 7.9*  PHOS -- -- -- -- -- -- --   Liver Function Tests:  Lab 11/01/11 0505  AST 18  ALT 21  ALKPHOS 71  BILITOT 0.5  PROT 5.8*  ALBUMIN 2.8*   No results found for this basename: LIPASE:3,AMYLASE:3 in the last 168 hours No results found for this basename: AMMONIA:3 in the last 168 hours CBC:  Lab 11/01/11 0505 10/31/11 1450  WBC 8.8 7.3  NEUTROABS -- 5.6  HGB 9.5* 10.3*  HCT 27.5* 29.3*  MCV 84.6 84.0  PLT 310 353   PT/INR: @labrcntip (inr:5) Cardiac Enzymes: No results found for this basename: CKTOTAL:5,CKMB:5,CKMBINDEX:5,TROPONINI:5 in the last 168 hours CBG: No results found for this basename: GLUCAP:5 in the last 168 hours  Iron Studies: No results found for this basename: IRON:30,TIBC:30,TRANSFERRIN:30,FERRITIN:30 in the last 168 hours  Xrays/Other Studies: Dg Chest 2 View  11/02/2011  *RADIOLOGY REPORT*  Clinical Data: Shortness of breath.  Controlled hypertension. History of congestive heart failure.  Former smoker.  CHEST - 2 VIEW  Comparison: 03/03/2011.  11/03/2010. CT 08/22/2010.  Findings: There is stable cardiac silhouette enlargement. Ectasia and nonaneurysmal calcification of the thoracic aorta are seen.  On previous CT a large hiatal hernia was demonstrated with the majority the stomach and a  large portion of the transverse colon in the lower chest.  There appears to be of similar configuration on the current examination with bibasilar atelectasis.  No consolidation or pleural effusion is demonstrated.  There is osteopenic appearance of the bones with changes of degenerative disc disease and degenerative spondylosis. Old healed rib trauma is present.  IMPRESSION: Stable cardiac silhouette enlargement.  On previous CT a large hiatal hernia was demonstrated with the majority the stomach  and a large portion of the transverse colon in the lower chest.  There appears to be of similar configuration on the current examination.  There is an element of bibasilar atelectasis associated with the large hiatal hernia.  No consolidation or pleural effusion is seen. Chronic vascular and bony findings are stable and are detailed above.  Original Report Authenticated By: Crawford Givens, M.D.    Physical Exam:  Blood pressure 131/84, pulse 95, temperature 98 F (36.7 C), temperature source Oral, resp. rate 19, height 5\' 8"  (1.727 m), weight 73.4 kg (161 lb 13.1 oz), SpO2 95.00%.  Gen: alert, frail elderly male Skin: no rash, cyanosis HEENT:  EOMI, sclera anicteric, throat dry Neck: no JVD, no bruits or LAN Chest: clear bilat Heart: regular, no rub or gallop Abdomen: soft, nontender, scaphoid, no ascites Ext: no edema, +brawny reddish/brown chronic skin changed of both lower legs. Severe bilat foot deformities.  Neuro: alert, Ox3, no focal deficit Heme/Lymph: no bruising or LAN   Impression/Plan 1. Hyponatremia - looks volume depleted.  Uosm a bit high, but not real high.  Problems may be due to chronic lasix administration which will limit the ability to create a dilute urine.  Another possibility would be SIADH, or problems could be due to low solute intake. Will resume NaCl 0.9% for about 24 hours and see what kind of response he has now that lasix is out of the system.  If no improvement will give  hypertonic saline.  Repeat urine studies. No clinical evidence of CHF, cirrhosis, renal failure or hypothyroidism.  Would check cortisol level also.    Vinson Moselle  MD [employer] [phone] pgr    [phone] cell 11/03/2011, 2:18 PM

## 2011-11-04 DIAGNOSIS — R197 Diarrhea, unspecified: Secondary | ICD-10-CM

## 2011-11-04 DIAGNOSIS — I5032 Chronic diastolic (congestive) heart failure: Secondary | ICD-10-CM

## 2011-11-04 DIAGNOSIS — R0602 Shortness of breath: Secondary | ICD-10-CM

## 2011-11-04 DIAGNOSIS — E871 Hypo-osmolality and hyponatremia: Secondary | ICD-10-CM

## 2011-11-04 LAB — BASIC METABOLIC PANEL
BUN: 9 mg/dL (ref 6–23)
Calcium: 7.6 mg/dL — ABNORMAL LOW (ref 8.4–10.5)
Calcium: 7.6 mg/dL — ABNORMAL LOW (ref 8.4–10.5)
Calcium: 7.6 mg/dL — ABNORMAL LOW (ref 8.4–10.5)
Chloride: 88 mEq/L — ABNORMAL LOW (ref 96–112)
Creatinine, Ser: 0.51 mg/dL (ref 0.50–1.35)
Creatinine, Ser: 0.58 mg/dL (ref 0.50–1.35)
GFR calc Af Amer: >90 mL/min (ref >90)
GFR calc Af Amer: >90 mL/min (ref >90)
GFR calc non Af Amer: >90 mL/min (ref >90)
GFR calc non Af Amer: >90 mL/min (ref >90)
GFR calc non Af Amer: >90 mL/min (ref >90)
Glucose, Bld: 89 mg/dL (ref 70–99)
Potassium: 3.9 mEq/L (ref 3.5–5.1)
Sodium: 121 mEq/L — ABNORMAL LOW (ref 135–145)

## 2011-11-04 LAB — SODIUM, URINE, RANDOM: Sodium, Ur: 112 mEq/L

## 2011-11-04 MED ORDER — LOSARTAN POTASSIUM 25 MG PO TABS
25.0000 mg | ORAL_TABLET | Freq: Every day | ORAL | Status: DC
  Administered 2011-11-05 – 2011-11-06 (×2): 25 mg via ORAL
  Filled 2011-11-04 (×2): qty 1

## 2011-11-04 MED ORDER — CARVEDILOL 6.25 MG PO TABS
6.2500 mg | ORAL_TABLET | Freq: Two times a day (BID) | ORAL | Status: DC
  Administered 2011-11-05 – 2011-11-06 (×3): 6.25 mg via ORAL
  Filled 2011-11-04 (×5): qty 1

## 2011-11-04 NOTE — Progress Notes (Signed)
Subjective: No CP, no SOB; feeling better. No overnight events. Afebrile  Objective: Vital signs in last 24 hours: Temp:  [98.1 F (36.7 C)-98.3 F (36.8 C)] 98.2 F (36.8 C) (06/21 0610) Pulse Rate:  [82-86] 85  (06/21 0610) Resp:  [19-20] 19  (06/21 0610) BP: (109-125)/(77-88) 121/77 mmHg (06/21 0610) SpO2:  [96 %] 96 % (06/21 0610) Weight:  [71.5 kg (157 lb 10.1 oz)] 71.5 kg (157 lb 10.1 oz) (06/21 0610) Weight change:  Last BM Date: 11/02/11  Intake/Output from previous day: 06/20 0701 - 06/21 0700 In: 1581.3 [I.V.:1581.3] Out: 450 [Urine:450]     Physical Exam: General: Alert, awake, oriented x2, in no acute distress. HEENT: No bruits, no goiter. Heart: s1 and s2 Lungs: Clear to auscultation bilaterally. Abdomen: Soft, nontender, nondistended, positive bowel sounds. Extremities: No clubbing, cyanosis or edema with positive pedal pulses. Chronic foot deformities (according to patient due to arthritis and ballet). Neuro: Grossly intact, nonfocal.   Lab Results: Basic Metabolic Panel:  Basename 11/04/11 0610 11/03/11 2250  NA 121* 118*  K 3.8 3.8  CL 87* 83*  CO2 26 28  GLUCOSE 89 97  BUN 7 8  CREATININE 0.51 0.51  CALCIUM 7.6* 7.7*  MG -- --  PHOS -- --   Misc. Labs:  Recent Results (from the past 240 hour(s))  MRSA PCR SCREENING     Status: Abnormal   Collection Time   11/01/11  2:39 AM      Component Value Range Status Comment   MRSA by PCR POSITIVE (*) NEGATIVE Final     Studies/Results: No results found.  Medications: Scheduled Meds:    . carvedilol  25 mg Oral BID WC  . dutasteride  0.5 mg Oral Daily  . enoxaparin  40 mg Subcutaneous Q24H  . feeding supplement  237 mL Oral BID BM  . hydrOXYzine  10 mg Oral QHS  . losartan  50 mg Oral Daily  . multivitamin-lutein  1 capsule Oral Daily  . omega-3 acid ethyl esters  2 g Oral Daily  . oxybutynin  5 mg Oral Daily  . pantoprazole  40 mg Oral Daily  . polyethylene glycol  17 g Oral Daily  .  sennosides-docusate sodium  1 tablet Oral QHS  . sodium chloride  500 mL Intravenous Once  . sodium chloride  3 mL Intravenous Q12H  . DISCONTD: furosemide  40 mg Oral Daily   Continuous Infusions:    . sodium chloride 10 mL/hr at 11/02/11 1049  . sodium chloride 125 mL/hr at 11/04/11 0449   PRN Meds:.acetaminophen, acetaminophen, ondansetron (ZOFRAN) IV, ondansetron  Assessment/Plan: 1-Hyponatremia: per results appears to be 2/2 to SIADH vs chronic use of lasix. Nephrology on board; will follow their rec's. Patient sodium 121 (highest level since admission). Continue IVF and holding lasix for now.  2-hx of CHF: volume status is stable; no signs of fluid overload. Continue holding lasix; Continue daily weight and close I's and O's  3-Hypokalemia: due to diuretics; repleted.  4-Dysphagia: due to hiatal hernia; no dysmotility found on bed side evaluation by speech therapy. Will follow their recommendations and instructions. Dys 3 thin liquids.  5- SOB: stable; CXR w/o findings of infiltrates. No SOB. Will continue monitoring.  6-HTN: well controlled; continue current regimen except for lasix, which will continue to be on hold as part of his hyponatremia tx.   LOS: 4 days   Kjersti Dittmer [facility] [phone]  11/04/2011, 10:03 AM

## 2011-11-04 NOTE — Progress Notes (Signed)
Unit secretary notified RN of alarm for V-Tach. 3 beats of V-Tach noted on strip. VSS. Patient asymptomatic, denied c/o. On-call MD paged @ 1053. Return call from Dr. Claiborne Billings @ 1057. New order placed to check Magnesium level in the AM. RN to alert MD of any new changes. Pt currently resting quietly in bed. Will continue to monitor.

## 2011-11-04 NOTE — Progress Notes (Signed)
Per Jasmine December at [facility], weekend admission is possible.    Jasmine December requesting CSW fax Pt's H&P, meds, FL2 and recent prog note.  Faxed aforementioned documents to [facility].  CSW to continue to follow.  Providence Crosby, LCSWA Clinical Social Work [phone]

## 2011-11-04 NOTE — Progress Notes (Signed)
Occupational Therapy Treatment Patient Details Name: [name] MRN: [id number] DOB: 04-29-1927 Today's Date: 11/04/2011 Time: 8295-6213 OT Time Calculation (min): 19 min  OT Assessment / Plan / Recommendation Comments on Treatment Session      Follow Up Recommendations  Skilled nursing facility    Barriers to Discharge       Equipment Recommendations  Defer to next venue    Recommendations for Other Services    Frequency Min 1X/week   Plan      Precautions / Restrictions Precautions Precautions: Fall Required Braces or Orthoses: Other Brace/Splint Other Brace/Splint: one shoe missing:  RN aware Restrictions Weight Bearing Restrictions: No   Pertinent Vitals/Pain No c/o pain    ADL  Grooming: Performed;Brushing hair Where Assessed - Grooming: Unsupported sitting (Pt did not feel he could do activity in standing) Transfers/Ambulation Related to ADLs: Stood twice for up to 20 seconds:  pt felt he needed both hands on walker.  Performed grooming in sitting    OT Diagnosis:    OT Problem List:   OT Treatment Interventions:     OT Goals Acute Rehab OT Goals Time For Goal Achievement: 11/15/11 ADL Goals Pt Will Perform Grooming: with supervision;Standing at sink ADL Goal: Grooming - Progress: Other (comment) (slow progress; ) Miscellaneous OT Goals Miscellaneous OT Goal #1: Pt will stand for 60 seconds with supervision for adls OT Goal: Miscellaneous Goal #1 - Progress: Goal set today  Visit Information  Last OT Received On: 11/04/11 Assistance Needed: +1    Subjective Data      Prior Functioning       Cognition  Overall Cognitive Status: Appears within functional limits for tasks assessed/performed (some comments tangential; some on target) Behavior During Session: [facility] for tasks performed    Mobility Transfers Sit to Stand: 4: Min guard;From chair/3-in-1;With armrests   Exercises    Balance    End of Session OT - End of Session Activity Tolerance:  Patient tolerated treatment well Patient left: in chair;with call bell/phone within reach   [facility] 11/04/2011, 12:06 PM Marica Otter, OTR/L [phone] 11/04/2011

## 2011-11-04 NOTE — Progress Notes (Signed)
CARE MANAGEMENT NOTE 11/04/2011  Patient:  [name], [name]   Account Number:  [id number]  Date Initiated:  11/01/2011  Documentation initiated by:  Moriah Shawley  Subjective/Objective Assessment:   pt with history of hyponatremia na on admission 115.     Action/Plan:   snf   Anticipated DC Date:  11/07/2011   Anticipated DC Plan:  SKILLED NURSING FACILITY  In-house referral  NA      DC Planning Services  NA      [facility] Choice  NA   Choice offered to / List presented to:  NA   DME arranged  NA      DME agency  NA     HH arranged  NA      HH agency  NA   Status of service:  In process, will continue to follow Medicare Important Message given?  NA - LOS <3 / Initial given by admissions (If response is "NO", the following Medicare IM given date fields will be blank) Date Medicare IM given:   Date Additional Medicare IM given:    Discharge Disposition:    Per UR Regulation:  Reviewed for med. necessity/level of care/duration of stay  If discussed at Long Length of Stay Meetings, dates discussed:    Comments:  [phone] Marcelle Smiling, RN, BSN, CCM No discharge needs present at time of this review Case Management [phone]

## 2011-11-04 NOTE — Progress Notes (Signed)
Subjective: Up in chair, eating lunch. No complaints.   Objective Vital signs in last 24 hours: Filed Vitals:   11/03/11 0527 11/03/11 1300 11/03/11 2122 11/04/11 0610  BP:  125/88 109/78 121/77  Pulse:  86 82 85  Temp:  98.3 F (36.8 C) 98.1 F (36.7 C) 98.2 F (36.8 C)  TempSrc:  Oral Oral Oral  Resp:  20 19 19   Height:      Weight:    71.5 kg (157 lb 10.1 oz)  SpO2: 95% 96% 96% 96%   Weight change:   Intake/Output Summary (Last 24 hours) at 11/04/11 1154 Last data filed at 11/04/11 1100  Gross per 24 hour  Intake 1821.25 ml  Output    450 ml  Net 1371.25 ml   Labs: Basic Metabolic Panel:  Lab 11/04/11 1610 11/03/11 2250 11/03/11 1609 11/03/11 0640 11/02/11 2204 11/02/11 1400 11/02/11 0515  NA 121* 118* 118* 118* 118* 118* 119*  K 3.8 3.8 4.3 4.0 4.2 3.1* 3.2*  CL 87* 83* 82* 80* 82* 80* 82*  CO2 26 28 28 28 28 29 29   GLUCOSE 89 97 106* 99 115* 136* 96  BUN 7 8 8 7 8 8 8   CREATININE 0.51 0.51 0.51 0.52 0.57 0.51 0.58  ALB -- -- -- -- -- -- --  CALCIUM 7.6* 7.7* 8.0* 8.1* 8.1* 7.8* 7.9*  PHOS -- -- -- -- -- -- --   Liver Function Tests:  Lab 11/01/11 0505  AST 18  ALT 21  ALKPHOS 71  BILITOT 0.5  PROT 5.8*  ALBUMIN 2.8*   No results found for this basename: LIPASE:3,AMYLASE:3 in the last 168 hours No results found for this basename: AMMONIA:3 in the last 168 hours CBC:  Lab 11/01/11 0505 10/31/11 1450  WBC 8.8 7.3  NEUTROABS -- 5.6  HGB 9.5* 10.3*  HCT 27.5* 29.3*  MCV 84.6 84.0  PLT 310 353   PT/INR: @labrcntip (inr:5) Cardiac Enzymes: No results found for this basename: CKTOTAL:5,CKMB:5,CKMBINDEX:5,TROPONINI:5 in the last 168 hours CBG: No results found for this basename: GLUCAP:5 in the last 168 hours  Iron Studies: No results found for this basename: IRON:30,TIBC:30,TRANSFERRIN:30,FERRITIN:30 in the last 168 hours  Physical Exam:  Blood pressure 121/77, pulse 85, temperature 98.2 F (36.8 C), temperature source Oral, resp. rate 19, height  5\' 8"  (1.727 m), weight 71.5 kg (157 lb 10.1 oz), SpO2 96.00%.  Gen: alert, frail elderly male Neck: no JVD, no bruits or LAN  Chest: clear bilat  Heart: regular, no rub or gallop  Abdomen: soft, nontender, scaphoid, no ascites  Ext: no edema, +brawny reddish/brown chronic skin changed of both lower legs. Severe bilat foot deformities.  Neuro: alert, Ox3, no focal deficit  Heme/Lymph: no bruising or LAN   Uosm- 244, repeat 331 UNa- 92, repeat 112 Creat- 0.52 Cortisol- 19.6, TSH- 1.68  Impression/Plan 1. Hyponatremia - suspect volume depletion and loop diuretics as cause. Another possibility would be SIADH, or problems due to low solute intake.  Uosm was not highly elevated.  Continue NS 0.9% for now, check bmet every 8 hours.  Would keep off of lasix after discharge if possible.    Vinson Moselle  MD [facility] [phone] pgr    [phone] cell 11/04/2011, 11:54 AM

## 2011-11-04 NOTE — Progress Notes (Signed)
Physical Therapy Treatment Patient Details Name: [name] MRN: [id number] DOB: 12/12/1926 Today's Date: 11/04/2011 Time: 4098-1191 PT Time Calculation (min): 21 min  PT Assessment / Plan / Recommendation Comments on Treatment Session  Pt assisted to recliner.  Pt requiring minA for mobility mostly for balance and pt is fearful of falling.  Pt missing one shoe from room so deferred ambulation until pt has shoes.    Follow Up Recommendations  Skilled nursing facility    Barriers to Discharge        Equipment Recommendations  Defer to next venue    Recommendations for Other Services    Frequency     Plan Discharge plan remains appropriate;Frequency remains appropriate    Precautions / Restrictions Precautions Precautions: Fall Required Braces or Orthoses: Other Brace/Splint Other Brace/Splint: velcro shoes for possible charcot deformity Restrictions Weight Bearing Restrictions: No   Pertinent Vitals/Pain No pain    Mobility  Bed Mobility Bed Mobility: Supine to Sit Supine to Sit: 5: Supervision;HOB elevated Details for Bed Mobility Assistance: increased time Transfers Transfers: Sit to Stand;Stand to Sit;Stand Pivot Transfers Sit to Stand: 4: Min assist Stand to Sit: 4: Min Multimedia programmer Transfers: 4: Min assist Details for Transfer Assistance: assist to rise and steady, verbal cues for hand placement, assist for steadying with standpivot with RW, pt missing one shoe so did not ambulate due to severe feet deformity Banker and charge RN aware)    Exercises     PT Diagnosis:    PT Problem List:   PT Treatment Interventions:     PT Goals Acute Rehab PT Goals PT Goal: Supine/Side to Sit - Progress: Partly met PT Goal: Sit to Stand - Progress: Progressing toward goal PT Goal: Stand to Sit - Progress: Progressing toward goal  Visit Information  Last PT Received On: 11/04/11 Assistance Needed: +2    Subjective Data  Subjective: "I need help while I'm here."   (bathing, ADLs, etc.)   Cognition  Overall Cognitive Status: Appears within functional limits for tasks assessed/performed Behavior During Session: [facility] for tasks performed    Balance     End of Session PT - End of Session Activity Tolerance: Patient limited by fatigue Patient left: in chair;with call bell/phone within reach;with nursing in room    [facility] 11/04/2011, 12:11 PM Pager: [phone]

## 2011-11-04 NOTE — Progress Notes (Signed)
Patient's blood pressure 70/44 in both arms while sitting in the chair.  Patient is asymptomatic.  Dr. Gwenlyn Perking aware. Patient to be escorted to bed and blood pressure rechecked while in bed.  Will page Dr. Gwenlyn Perking with repeat blood pressure.

## 2011-11-04 NOTE — Progress Notes (Signed)
Per MD, Pt not medically ready for d/c today; possibly over the weekend.  LM for Jasmine December, Admission Coordinator at [facility].  CSW to continue to follow.  Providence Crosby, LCSWA Clinical Social Work [phone]

## 2011-11-04 NOTE — Progress Notes (Signed)
Discussed with patient and his "adopted son" about his progress, BP, activities, answered questions.  Patient expressed that he was feeling better and hopefully would be discharged back to [facility].  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

## 2011-11-05 DIAGNOSIS — R197 Diarrhea, unspecified: Secondary | ICD-10-CM

## 2011-11-05 DIAGNOSIS — I5032 Chronic diastolic (congestive) heart failure: Secondary | ICD-10-CM

## 2011-11-05 DIAGNOSIS — E871 Hypo-osmolality and hyponatremia: Secondary | ICD-10-CM

## 2011-11-05 LAB — BASIC METABOLIC PANEL
CO2: 26 mEq/L (ref 19–32)
Calcium: 7.7 mg/dL — ABNORMAL LOW (ref 8.4–10.5)
Creatinine, Ser: 0.59 mg/dL (ref 0.50–1.35)
GFR calc Af Amer: >90 mL/min (ref >90)
GFR calc non Af Amer: >90 mL/min (ref >90)
Sodium: 123 mEq/L — ABNORMAL LOW (ref 135–145)

## 2011-11-05 MED ORDER — MAGNESIUM SULFATE 40 MG/ML IJ SOLN
2.0000 g | Freq: Once | INTRAMUSCULAR | Status: AC
  Administered 2011-11-05: 2 g via INTRAVENOUS
  Filled 2011-11-05: qty 50

## 2011-11-05 MED ORDER — SODIUM CHLORIDE 0.9 % IV SOLN
1.0000 g | Freq: Once | INTRAVENOUS | Status: AC
  Administered 2011-11-05: 1 g via INTRAVENOUS
  Filled 2011-11-05: qty 10

## 2011-11-05 MED ORDER — POTASSIUM CHLORIDE CRYS ER 20 MEQ PO TBCR
40.0000 meq | EXTENDED_RELEASE_TABLET | ORAL | Status: AC
  Administered 2011-11-05: 40 meq via ORAL
  Filled 2011-11-05: qty 2

## 2011-11-05 NOTE — Progress Notes (Addendum)
CSW was contacted by MD for possible d/c today or tom.   CSW confirmed with Donne Hazel at [facility] that Pt will be remaining here tonight (per MD) and be up for d/c tomorrow.   CSW notified Jasmine December of MD decision.  Leron Croak, LCSWA [facility] Weekend Coverage [phone]   CSW spoke with Freddy Jaksch [phone]) concerning contacting Donne Hazel at [facility] to sign necessary paperwork for Pt admission. Ms. Berline Chough did have contact info for  [employer].   Leron Croak, LCSWA [employer] Coverage [phone]

## 2011-11-05 NOTE — Progress Notes (Signed)
Subjective: No CP, no SOB; feeling better. Multiple runs of V-tachs overnight; sodium continue improving. Afebrile  Objective: Vital signs in last 24 hours: Temp:  [98.2 F (36.8 C)-98.6 F (37 C)] 98.4 F (36.9 C) (06/22 0600) Pulse Rate:  [78-90] 90  (06/22 0600) Resp:  [18-20] 18  (06/22 0600) BP: (75-128)/(53-81) 126/81 mmHg (06/22 0600) SpO2:  [94 %-98 %] 94 % (06/22 0600) Weight:  [72.3 kg (159 lb 6.3 oz)] 72.3 kg (159 lb 6.3 oz) (06/22 0600) Weight change: 0.8 kg (1 lb 12.2 oz) Last BM Date: 11/02/11  Intake/Output from previous day: 06/21 0701 - 06/22 0700 In: 1438.3 [P.O.:480; I.V.:958.3] Out: 1601 [Urine:1600; Stool:1] Total I/O In: -  Out: 800 [Urine:800]   Physical Exam: General: Alert, awake, oriented x2, in no acute distress. HEENT: No bruits, no goiter. Heart: s1 and s2 Lungs: Clear to auscultation bilaterally. Abdomen: Soft, nontender, nondistended, positive bowel sounds. Extremities: No clubbing, cyanosis or edema with positive pedal pulses. Chronic foot deformities (according to patient due to arthritis and ballet). Neuro: Grossly intact, nonfocal.  Lab Results: Basic Metabolic Panel:  Basename 11/05/11 0750 11/04/11 2230  NA 123* 121*  K 3.5 3.6  CL 89* 88*  CO2 26 26  GLUCOSE 92 90  BUN 6 8  CREATININE 0.59 0.58  CALCIUM 7.7* 7.6*  MG 1.2* --  PHOS -- --   Misc. Labs:  Recent Results (from the past 240 hour(s))  MRSA PCR SCREENING     Status: Abnormal   Collection Time   11/01/11  2:39 AM      Component Value Range Status Comment   MRSA by PCR POSITIVE (*) NEGATIVE Final     Studies/Results: No results found.  Medications: Scheduled Meds:    . calcium gluconate  1 g Intravenous Once  . carvedilol  6.25 mg Oral BID WC  . dutasteride  0.5 mg Oral Daily  . enoxaparin  40 mg Subcutaneous Q24H  . feeding supplement  237 mL Oral BID BM  . hydrOXYzine  10 mg Oral QHS  . losartan  25 mg Oral Daily  . magnesium sulfate 1 - 4 g bolus  IVPB  2 g Intravenous Once  . multivitamin-lutein  1 capsule Oral Daily  . omega-3 acid ethyl esters  2 g Oral Daily  . oxybutynin  5 mg Oral Daily  . pantoprazole  40 mg Oral Daily  . polyethylene glycol  17 g Oral Daily  . sennosides-docusate sodium  1 tablet Oral QHS  . sodium chloride  3 mL Intravenous Q12H  . DISCONTD: carvedilol  25 mg Oral BID WC  . DISCONTD: losartan  50 mg Oral Daily   Continuous Infusions:    . sodium chloride 10 mL/hr at 11/02/11 1049  . sodium chloride 1,000 mL (11/05/11 0825)   PRN Meds:.acetaminophen, acetaminophen, ondansetron (ZOFRAN) IV, ondansetron  Assessment/Plan: 1-Hyponatremia: per results appears to be 2/2 to SIADH vs chronic use of lasix. Nephrology on board; will follow their rec's. Patient sodium 123 (highest level since admission). Continue IVF and holding lasix for now.  2-hx of CHF: volume status is stable; no signs of fluid overload. Continue holding lasix; Continue daily weight and close I's and O's  3-Hypokalemia: due to diuretics; now repleted.  4-Dysphagia: due to hiatal hernia; no dysmotility found on bed side evaluation by speech therapy. Will follow their recommendations and instructions. Dys 3 thin liquids.  5- SOB: stable; CXR w/o findings of infiltrates. No SOB. Will continue monitoring.  6-HTN: well controlled;  continue current regimen except for lasix, which will continue to be on hold as part of his hyponatremia tx.  7-Hx of atrial ZOX:WRUE multiple episodes of v-tach overnight. Will replete calcium level, magnesium and give 1 dose of potassium. Not a candidate for anticoagulation.   LOS: 5 days   Nickole Adamek [facility] [phone]  11/05/2011, 12:59 PM

## 2011-11-05 NOTE — Progress Notes (Signed)
Subjective: In bed, no complaints.    Objective Vital signs in last 24 hours: Filed Vitals:   11/04/11 1532 11/04/11 1723 11/04/11 2200 11/05/11 0600  BP: 98/62 108/68 128/71 126/81  Pulse:  78 88 90  Temp:   98.2 F (36.8 C) 98.4 F (36.9 C)  TempSrc:   Oral Oral  Resp:   20 18  Height:      Weight:    72.3 kg (159 lb 6.3 oz)  SpO2:   95% 94%   Weight change: 0.8 kg (1 lb 12.2 oz)  Intake/Output Summary (Last 24 hours) at 11/05/11 1312 Last data filed at 11/05/11 1100  Gross per 24 hour  Intake 1198.33 ml  Output   2401 ml  Net -1202.67 ml   Labs: Basic Metabolic Panel:  Lab 11/05/11 1610 11/04/11 2230 11/04/11 1430 11/04/11 0610 11/03/11 2250 11/03/11 1609 11/03/11 0640  NA 123* 121* 120* 121* 118* 118* 118*  K 3.5 3.6 3.9 3.8 3.8 4.3 4.0  CL 89* 88* 87* 87* 83* 82* 80*  CO2 26 26 25 26 28 28 28   GLUCOSE 92 90 115* 89 97 106* 99  BUN 6 8 9 7 8 8 7   CREATININE 0.59 0.58 0.54 0.51 0.51 0.51 0.52  ALB -- -- -- -- -- -- --  CALCIUM 7.7* 7.6* 7.6* 7.6* 7.7* 8.0* 8.1*  PHOS -- -- -- -- -- -- --   Liver Function Tests:  Lab 11/01/11 0505  AST 18  ALT 21  ALKPHOS 71  BILITOT 0.5  PROT 5.8*  ALBUMIN 2.8*   No results found for this basename: LIPASE:3,AMYLASE:3 in the last 168 hours No results found for this basename: AMMONIA:3 in the last 168 hours CBC:  Lab 11/01/11 0505 10/31/11 1450  WBC 8.8 7.3  NEUTROABS -- 5.6  HGB 9.5* 10.3*  HCT 27.5* 29.3*  MCV 84.6 84.0  PLT 310 353   PT/INR: @labrcntip (inr:5) Cardiac Enzymes: No results found for this basename: CKTOTAL:5,CKMB:5,CKMBINDEX:5,TROPONINI:5 in the last 168 hours CBG: No results found for this basename: GLUCAP:5 in the last 168 hours  Iron Studies: No results found for this basename: IRON:30,TIBC:30,TRANSFERRIN:30,FERRITIN:30 in the last 168 hours  Physical Exam:  Blood pressure 126/81, pulse 90, temperature 98.4 F (36.9 C), temperature source Oral, resp. rate 18, height 5\' 8"  (1.727 m), weight  72.3 kg (159 lb 6.3 oz), SpO2 94.00%.  Gen: alert, frail elderly male Neck: no JVD, no bruits or LAN  Chest: clear bilat  Heart: regular, no rub or gallop  Abdomen: soft, nontender, scaphoid, no ascites  Ext: no edema, +brawny reddish/brown chronic skin changed of both lower legs. Severe bilat foot deformities.  Neuro: alert, Ox3, no focal deficit  Heme/Lymph: no bruising or LAN   Uosm- 244, repeat 331 UNa- 92, repeat 112 Creat- 0.52 Cortisol- 19.6, TSH- 1.68  Impression/Plan 1. Hyponatremia - suspect volume depletion and loop diuretics as cause. Another possibility would be SIADH, or problems due to low solute intake.  Uosm was not highly elevated, 250-350 range.  He seems to be responding to normal saline and holding lasix.  Na 123 today, would continue.  Not developed any signs of edema or fluid overload.  Creat normal.     Daniel Moselle  MD [facility] [phone] pgr    [phone] cell 11/05/2011, 1:12 PM

## 2011-11-05 NOTE — Progress Notes (Signed)
On-call MD paged @ 0132 to inform v-tach beats continue to be noted, as well as intermittent PVCs. No new orders given upon return call.  Pt remains asymptomatic, denies c/o at this time. VSS. Currently resting in bed. Will continue to monitor.

## 2011-11-06 DIAGNOSIS — I5032 Chronic diastolic (congestive) heart failure: Secondary | ICD-10-CM

## 2011-11-06 DIAGNOSIS — E871 Hypo-osmolality and hyponatremia: Secondary | ICD-10-CM

## 2011-11-06 DIAGNOSIS — R197 Diarrhea, unspecified: Secondary | ICD-10-CM

## 2011-11-06 LAB — BASIC METABOLIC PANEL
CO2: 25 mEq/L (ref 19–32)
Calcium: 8 mg/dL — ABNORMAL LOW (ref 8.4–10.5)
Glucose, Bld: 93 mg/dL (ref 70–99)
Sodium: 126 mEq/L — ABNORMAL LOW (ref 135–145)

## 2011-11-06 MED ORDER — CALCIUM CARBONATE-VITAMIN D 500-200 MG-UNIT PO TABS: 1.0000 | ORAL_TABLET | Freq: Every day | ORAL | Status: DC

## 2011-11-06 MED ORDER — CARVEDILOL 12.5 MG PO TABS: 12.5000 mg | ORAL_TABLET | Freq: Two times a day (BID) | ORAL | Status: DC

## 2011-11-06 MED ORDER — ENSURE COMPLETE PO LIQD: 237.0000 mL | Freq: Two times a day (BID) | ORAL | Status: DC

## 2011-11-06 MED ORDER — LOSARTAN POTASSIUM 25 MG PO TABS: 25.0000 mg | ORAL_TABLET | Freq: Every day | ORAL | Status: DC

## 2011-11-06 NOTE — Discharge Instructions (Signed)
Hyponatremia  Hyponatremia is when the amount of salt (sodium) in your blood is too low. When sodium levels are low, your cells will absorb extra water and swell. The swelling happens throughout the body, but it mostly affects the brain. Severe brain swelling (cerebral edema), seizures, or coma can happen.  CAUSES   Heart, kidney, or liver problems.   Thyroid problems.   Adrenal gland problems.   Severe vomiting and diarrhea.   Certain medicines or illegal drugs.   Dehydration.   Drinking too much water.   Low-sodium diet.  SYMPTOMS   Nausea and vomiting.   Confusion.   Lethargy.   Agitation.   Headache.   Twitching or shaking (seizures).   Unconsciousness.   Appetite loss.   Muscle weakness and cramping.  DIAGNOSIS  Hyponatremia is identified by a simple blood test. Your caregiver will perform a history and physical exam to try to find the cause and type of hyponatremia. Other tests may be needed to measure the amount of sodium in your blood and urine. TREATMENT  Treatment will depend on the cause.   Fluids may be given through the vein (IV).   Medicines may be used to correct the sodium imbalance. If medicines are causing the problem, they will need to be adjusted.   Water or fluid intake may be restricted to restore proper balance.  The speed of correcting the sodium problem is very important. If the problem is corrected too fast, nerve damage (sometimes unchangeable) can happen. HOME CARE INSTRUCTIONS   Only take medicines as directed by your caregiver. Many medicines can make hyponatremia worse. Discuss all your medicines with your caregiver.   Carefully follow any recommended diet, including any fluid restrictions.   You may be asked to repeat lab tests. Follow these directions.   Avoid alcohol and recreational drugs.  SEEK MEDICAL CARE IF:   You develop worsening nausea, fatigue, headache, confusion, or weakness.   Your original hyponatremia  symptoms return.   You have problems following the recommended diet.  SEEK IMMEDIATE MEDICAL CARE IF:   You have a seizure.   You faint.   You have ongoing diarrhea or vomiting.  MAKE SURE YOU:   Understand these instructions.   Will watch your condition.   Will get help right away if you are not doing well or get worse.  Document Released: 04/22/2002 Document Revised: 04/21/2011 Document Reviewed: 10/17/2010 [facility] Patient Information 2012 [place], [place].Hyponatremia  Hyponatremia is when the amount of salt (sodium) in your blood is too low. When sodium levels are low, your cells will absorb extra water and swell. The swelling happens throughout the body, but it mostly affects the brain. Severe brain swelling (cerebral edema), seizures, or coma can happen.  CAUSES   Heart, kidney, or liver problems.   Thyroid problems.   Adrenal gland problems.   Severe vomiting and diarrhea.   Certain medicines or illegal drugs.   Dehydration.   Drinking too much water.   Low-sodium diet.  SYMPTOMS   Nausea and vomiting.   Confusion.   Lethargy.   Agitation.   Headache.   Twitching or shaking (seizures).   Unconsciousness.   Appetite loss.   Muscle weakness and cramping.  DIAGNOSIS  Hyponatremia is identified by a simple blood test. Your caregiver will perform a history and physical exam to try to find the cause and type of hyponatremia. Other tests may be needed to measure the amount of sodium in your blood and urine. TREATMENT  Treatment will  depend on the cause.   Fluids may be given through the vein (IV).   Medicines may be used to correct the sodium imbalance. If medicines are causing the problem, they will need to be adjusted.   Water or fluid intake may be restricted to restore proper balance.  The speed of correcting the sodium problem is very important. If the problem is corrected too fast, nerve damage (sometimes unchangeable) can happen. HOME CARE  INSTRUCTIONS   Only take medicines as directed by your caregiver. Many medicines can make hyponatremia worse. Discuss all your medicines with your caregiver.   Carefully follow any recommended diet, including any fluid restrictions.   You may be asked to repeat lab tests. Follow these directions.   Avoid alcohol and recreational drugs.  SEEK MEDICAL CARE IF:   You develop worsening nausea, fatigue, headache, confusion, or weakness.   Your original hyponatremia symptoms return.   You have problems following the recommended diet.  SEEK IMMEDIATE MEDICAL CARE IF:   You have a seizure.   You faint.   You have ongoing diarrhea or vomiting.  MAKE SURE YOU:   Understand these instructions.   Will watch your condition.   Will get help right away if you are not doing well or get worse.  Document Released: 04/22/2002 Document Revised: 04/21/2011 Document Reviewed: 10/17/2010 [facility] Patient Information 2012 [place], [place].

## 2011-11-06 NOTE — Progress Notes (Signed)
Patient ID: [name], male   DOB: [birth year], 78 y.o.   MRN: [id number]   [facility] Progress Note    Subjective:   Reports to be feeling better and asks me to hurry up as he plans on being discharged this afternoon   Objective:   BP 142/92  Pulse 90  Temp 97.8 F (36.6 C) (Oral)  Resp 20  Ht 5\' 8"  (1.727 m)  Wt 72.3 kg (159 lb 6.3 oz)  BMI 24.24 kg/m2  SpO2 94%  Intake/Output Summary (Last 24 hours) at 11/06/11 1103 Last data filed at 11/06/11 0600  Gross per 24 hour  Intake 6295.82 ml  Output    850 ml  Net 5445.82 ml   Weight change:   Physical Exam: Gen: alert, frail elderly male  Neck: no JVD, no bruits or LAN  Chest: clear bilaterally, no rales/rhonchi  Heart: Pulse RRR, no rub or gallop  Abdomen: soft, nontender, scaphoid, no ascites  Ext: no edema, +brawny reddish/brown chronic skin changed of both lower legs. Severe bilat foot deformities.  Neuro: alert, Ox3, no focal deficit  Heme/Lymph: no bruising or adenopathy    Imaging: No results found.  Labs: BMET  Lab 11/06/11 0539 11/05/11 0750 11/04/11 2230 11/04/11 1430 11/04/11 0610 11/03/11 2250 11/03/11 1609  NA 126* 123* 121* 120* 121* 118* 118*  K 4.1 3.5 3.6 3.9 3.8 3.8 4.3  CL 94* 89* 88* 87* 87* 83* 82*  CO2 25 26 26 25 26 28 28   GLUCOSE 93 92 90 115* 89 97 106*  BUN 6 6 8 9 7 8 8   CREATININE 0.53 0.59 0.58 0.54 0.51 0.51 0.51  ALB -- -- -- -- -- -- --  CALCIUM 8.0* 7.7* 7.6* 7.6* 7.6* 7.7* 8.0*  PHOS -- -- -- -- -- -- --   CBC  Lab 11/01/11 0505 10/31/11 1450  WBC 8.8 7.3  NEUTROABS -- 5.6  HGB 9.5* 10.3*  HCT 27.5* 29.3*  MCV 84.6 84.0  PLT 310 353    Medications:      . calcium gluconate  1 g Intravenous Once  . carvedilol  6.25 mg Oral BID WC  . dutasteride  0.5 mg Oral Daily  . enoxaparin  40 mg Subcutaneous Q24H  . feeding supplement  237 mL Oral BID BM  . hydrOXYzine  10 mg Oral QHS  . losartan  25 mg Oral Daily  . magnesium sulfate 1 - 4 g bolus  IVPB  2 g Intravenous Once  . multivitamin-lutein  1 capsule Oral Daily  . omega-3 acid ethyl esters  2 g Oral Daily  . oxybutynin  5 mg Oral Daily  . pantoprazole  40 mg Oral Daily  . polyethylene glycol  17 g Oral Daily  . potassium chloride  40 mEq Oral Q4H  . sennosides-docusate sodium  1 tablet Oral QHS  . sodium chloride  3 mL Intravenous Q12H     Assessment/ Plan:   1. Hyponatremia: suspected multifactorial from poor solute intake and volume depletion in this elderly man with a likely reset osmostat. Excellent UOP noted overnight with gradually improving sodium level. Agree with saline locking IV (given H/O CHF) and continued holding of lasix with PO fluid restriction of 1.2L/day. Plans for DC today to SNF- instruct SNF to draw weekly labs for 3 weeks to follow sodium levels  2. CHF: Compensated- monitor for needs to restart diuretics at SNF 3. Afib with NSVT  Zetta Bills, MD 11/06/2011, 11:03 AM

## 2011-11-06 NOTE — Discharge Summary (Signed)
Physician Discharge Summary  Patient ID: RONTAE INGLETT MRN: [id number] DOB/AGE: 1926-12-26 78 y.o.  Admit date: 10/31/2011 Discharge date: 11/06/2011  Primary Care Physician:  Florentina Jenny, MD   Discharge Diagnoses:   1-AMS/metabolic encephalopathy (resolved and associated to hyponatremia) 2-HTN 3-BPH 4-Compensated chronic CHF (diastolic) 5-GERD 6-Chronic venous stasis dermatitis 7-Dysphagia due to hiatal hernia 8-Hx of atrial fibrillation (not a candidate for anticoagulation) 9-Hypocalcemia and hypomagnesimia    Medication List  As of 11/06/2011 10:45 AM   STOP taking these medications         furosemide 40 MG tablet      sennosides-docusate sodium 8.6-50 MG tablet         TAKE these medications         calcium-vitamin D 500-200 MG-UNIT per tablet   Commonly known as: OSCAL WITH D   Take 1 tablet by mouth daily.      carvedilol 12.5 MG tablet   Commonly known as: COREG   Take 1 tablet (12.5 mg total) by mouth 2 (two) times daily with a meal.      DECUBI-VITE PO   Take 1 tablet by mouth daily.      multivitamin-lutein Caps   Take 1 capsule by mouth daily.      dutasteride 0.5 MG capsule   Commonly known as: AVODART   Take 0.5 mg by mouth daily.      feeding supplement Liqd   Take 237 mLs by mouth 2 (two) times daily between meals.      fish oil-omega-3 fatty acids 1000 MG capsule   Take 2 g by mouth daily.      hydrOXYzine 10 MG tablet   Commonly known as: ATARAX/VISTARIL   Take 10 mg by mouth at bedtime.      losartan 25 MG tablet   Commonly known as: COZAAR   Take 1 tablet (25 mg total) by mouth daily.      oxybutynin 5 MG tablet   Commonly known as: DITROPAN   Take 5 mg by mouth daily.      pantoprazole 40 MG tablet   Commonly known as: PROTONIX   Take 40 mg by mouth daily.      polyethylene glycol packet   Commonly known as: MIRALAX / GLYCOLAX   Take 17 g by mouth daily.            Disposition and Follow-up:  Patient discharge in  stable and improved condition; at this point mentation is back to baseline and he is feeling more stronger. Sodium 126 and steadily rising towards normal range; at this point will discontinue lasix and will recommend to follow a restricted fluid intake of 1200cc per day. Follow up with PCP in 7 days to repeat BMET. BP was also soft and his medications were adjusted. Rest of problems remains stable and the plan is to continue same medication regimen.  Consults:  Nephrology   Significant Diagnostic Studies:  No results found.   Brief H and P: For complete details please refer to admission H and P, but in brief 78 year old male with h/o chronic hyponatremia as per the family at bedside, CHF, Atrial fibrillation, Hypertension was brought in for worsening of hyponatremia. The value at SNF was 118. On arrival to ED His NA level was 115. He was found to be slightly confused when compared to baseline mental status. Occasional cough. Non productive. No fever or chills. No nausea or vomiting or abdominal pain. No chest pain or shortness of  breath. No headache or blurry vision. He is being admitted to hospitalist service for evaluation and management of hyponatremia.   Hospital Course:  1-Hyponatremia: per results appears to be 2/2 to SIADH vs chronic use of lasix. Case discuss with Nephrology and decision was to stop lasix and to follow a fluid restricted diet. Patient will follow with PCP in 7 days to have his sodium recheck. MS is back to baseline. Na at discharge 126  2-Encephalopathy/AMS: due to hyponatremia; resolved once electrolyte corrected.  3-hx of CHF: volume status is stable; no signs of fluid overload. Continue holding lasix. Will need daily weight and close I's and O's.   4-Hypokalemia: due to diuretics; now repleted.   5-Dysphagia: due to hiatal hernia; no dysmotility found on bed side evaluation by speech therapy. Will follow their recommendations and instructions. Dys 3 thin liquids.    6-Non-productive cough: CXR w/o findings of infiltrates. No SOB. No antibiotics given; most likely bronchitis.  7-HTN: well controlled; continue current regimen except for lasix, which will continue to be on hold as part of his hyponatremia tx.   8-Hx of atrial fib: with episodes of v-tach. Most likely due to electrolytes abnormalities triggered by diuretics. Once calcium, magnesium and potassium were repleted and stable; no further v-tach appreciated. Patient remains in a. Fib but is rate control and he is not a candidate for anticoagulation therapy (due to fall risk).  9-hypocalcemia and hypomagnesemia: repleted.  Time spent on Discharge: 40 minutes  Signed: Rolla Kedzierski 11/06/2011, 10:45 AM

## 2011-11-06 NOTE — Progress Notes (Signed)
Pt to be d/c today to [facility]   Pt and family agreeable. Confirmed plans with facility.  Plan transfer via EMS.   Audia Amick, LCSWA [place] [employer] [phone]   

## 2011-11-14 DIAGNOSIS — E871 Hypo-osmolality and hyponatremia: Secondary | ICD-10-CM

## 2011-11-14 HISTORY — DX: Hypo-osmolality and hyponatremia: E87.1

## 2011-11-23 ENCOUNTER — Emergency Department (HOSPITAL_COMMUNITY): Payer: Medicare Other

## 2011-11-23 ENCOUNTER — Inpatient Hospital Stay (HOSPITAL_COMMUNITY)
Admission: EM | Admit: 2011-11-23 | Discharge: 2011-11-28 | DRG: 291 | Disposition: A | Discharge status: Skilled Nursing Facility | Payer: Medicare Other | Attending: Family Medicine | Admitting: Family Medicine

## 2011-11-23 ENCOUNTER — Encounter (HOSPITAL_COMMUNITY): Payer: Self-pay | Admitting: *Deleted

## 2011-11-23 DIAGNOSIS — I509 Heart failure, unspecified: Secondary | ICD-10-CM | POA: Diagnosis present

## 2011-11-23 DIAGNOSIS — R0602 Shortness of breath: Secondary | ICD-10-CM

## 2011-11-23 DIAGNOSIS — Z66 Do not resuscitate: Secondary | ICD-10-CM | POA: Diagnosis present

## 2011-11-23 DIAGNOSIS — J9600 Acute respiratory failure, unspecified whether with hypoxia or hypercapnia: Secondary | ICD-10-CM | POA: Diagnosis present

## 2011-11-23 DIAGNOSIS — D649 Anemia, unspecified: Secondary | ICD-10-CM | POA: Diagnosis present

## 2011-11-23 DIAGNOSIS — I10 Essential (primary) hypertension: Secondary | ICD-10-CM | POA: Diagnosis present

## 2011-11-23 DIAGNOSIS — E876 Hypokalemia: Secondary | ICD-10-CM | POA: Diagnosis present

## 2011-11-23 DIAGNOSIS — I5031 Acute diastolic (congestive) heart failure: Secondary | ICD-10-CM | POA: Diagnosis present

## 2011-11-23 DIAGNOSIS — I359 Nonrheumatic aortic valve disorder, unspecified: Secondary | ICD-10-CM | POA: Diagnosis present

## 2011-11-23 DIAGNOSIS — E871 Hypo-osmolality and hyponatremia: Secondary | ICD-10-CM | POA: Diagnosis present

## 2011-11-23 DIAGNOSIS — I5033 Acute on chronic diastolic (congestive) heart failure: Principal | ICD-10-CM | POA: Diagnosis present

## 2011-11-23 DIAGNOSIS — I872 Venous insufficiency (chronic) (peripheral): Secondary | ICD-10-CM | POA: Diagnosis present

## 2011-11-23 DIAGNOSIS — J189 Pneumonia, unspecified organism: Secondary | ICD-10-CM | POA: Diagnosis present

## 2011-11-23 DIAGNOSIS — I4891 Unspecified atrial fibrillation: Secondary | ICD-10-CM | POA: Diagnosis present

## 2011-11-23 DIAGNOSIS — J9601 Acute respiratory failure with hypoxia: Secondary | ICD-10-CM | POA: Diagnosis present

## 2011-11-23 DIAGNOSIS — I482 Chronic atrial fibrillation, unspecified: Secondary | ICD-10-CM

## 2011-11-23 HISTORY — DX: Anxiety disorder, unspecified: F41.9

## 2011-11-23 HISTORY — DX: Shortness of breath: R06.02

## 2011-11-23 HISTORY — DX: Pneumonia, unspecified organism: J18.9

## 2011-11-23 HISTORY — DX: Personal history of other diseases of the respiratory system: Z87.09

## 2011-11-23 HISTORY — DX: Malignant melanoma of other part of trunk: C43.59

## 2011-11-23 HISTORY — DX: Personal history of other infectious and parasitic diseases: Z86.19

## 2011-11-23 LAB — CBC WITH DIFFERENTIAL/PLATELET
Basophils Absolute: 0 10*3/uL (ref 0.0–0.1)
Eosinophils Relative: 2 % (ref 0–5)
HCT: 30.9 % — ABNORMAL LOW (ref 39.0–52.0)
Lymphocytes Relative: 11 % — ABNORMAL LOW (ref 12–46)
Lymphs Abs: 0.8 10*3/uL (ref 0.7–4.0)
MCV: 86.6 fL (ref 78.0–100.0)
Monocytes Absolute: 0.3 10*3/uL (ref 0.1–1.0)
RDW: 13.4 % (ref 11.5–15.5)
WBC: 7 10*3/uL (ref 4.0–10.5)

## 2011-11-23 LAB — BASIC METABOLIC PANEL
BUN: 11 mg/dL (ref 6–23)
CO2: 21 mEq/L (ref 19–32)
Calcium: 8.5 mg/dL (ref 8.4–10.5)
Creatinine, Ser: 0.56 mg/dL (ref 0.50–1.35)
Glucose, Bld: 155 mg/dL — ABNORMAL HIGH (ref 70–99)

## 2011-11-23 MED ORDER — FUROSEMIDE 10 MG/ML IJ SOLN
40.0000 mg | Freq: Once | INTRAMUSCULAR | Status: AC
  Administered 2011-11-23: 40 mg via INTRAVENOUS
  Filled 2011-11-23: qty 4

## 2011-11-23 MED ORDER — VANCOMYCIN HCL IN DEXTROSE 1-5 GM/200ML-% IV SOLN
1000.0000 mg | Freq: Once | INTRAVENOUS | Status: AC
  Administered 2011-11-24: 1000 mg via INTRAVENOUS
  Filled 2011-11-23: qty 200

## 2011-11-23 MED ORDER — PIPERACILLIN-TAZOBACTAM 3.375 G IVPB 30 MIN
3.3750 g | Freq: Once | INTRAVENOUS | Status: AC
  Administered 2011-11-24: 3.375 g via INTRAVENOUS
  Filled 2011-11-23: qty 50

## 2011-11-23 NOTE — ED Provider Notes (Signed)
78 year old male was brought in by ambulance because of acute onset of dyspnea. Mr. status was tenuous in the ambulance and he was placed on CPAP. On arrival, he was switched to BiPAP. Exam shows a borderline jugular venous distention. Her breath sounds are distant. Heart has regular rate and rhythm without murmur. He has 3+ pitting edema. Clinically, he most likely has exacerbation of congestive heart failure and will be treated with diuresis. At this point, his respiratory status is stable and he does not require emergent intubation.   Date: 11/23/2011  Rate: 98  Rhythm: atrial fibrillation  QRS Axis: normal  Intervals: normal  ST/T Wave abnormalities: normal  Conduction Disutrbances:Incomplete right bundle-branch block  Narrative Interpretation: Atrial fibrillation with controlled ventricular response. Incomplete right bundle-branch block. When compared with ECG of 10/31/2011, no significant changes are seen.  Old EKG Reviewed: unchanged    Dione Booze, MD 11/23/11 2200

## 2011-11-23 NOTE — ED Notes (Signed)
Called out for abdominal pain and shortness of breath.  Pt placed on CPAP prior to arrival. Pressures running 90-130/60. Pt is in afib 80-100 (pt has hx of the same). Diminished breath sounds throughout. History of CHF. Pt is from [facility].

## 2011-11-23 NOTE — ED Notes (Signed)
Switched pt to nonrebreather per dr. Lew Dawes.

## 2011-11-23 NOTE — ED Notes (Signed)
Respiratory called regarding blood gas 

## 2011-11-24 ENCOUNTER — Encounter (HOSPITAL_COMMUNITY): Payer: Self-pay | Admitting: Emergency Medicine

## 2011-11-24 DIAGNOSIS — J9601 Acute respiratory failure with hypoxia: Secondary | ICD-10-CM | POA: Diagnosis present

## 2011-11-24 DIAGNOSIS — I482 Chronic atrial fibrillation, unspecified: Secondary | ICD-10-CM | POA: Diagnosis present

## 2011-11-24 DIAGNOSIS — J189 Pneumonia, unspecified organism: Secondary | ICD-10-CM | POA: Insufficient documentation

## 2011-11-24 DIAGNOSIS — E871 Hypo-osmolality and hyponatremia: Secondary | ICD-10-CM | POA: Diagnosis present

## 2011-11-24 DIAGNOSIS — R600 Localized edema: Secondary | ICD-10-CM | POA: Insufficient documentation

## 2011-11-24 DIAGNOSIS — I509 Heart failure, unspecified: Secondary | ICD-10-CM | POA: Insufficient documentation

## 2011-11-24 DIAGNOSIS — I8310 Varicose veins of unspecified lower extremity with inflammation: Secondary | ICD-10-CM

## 2011-11-24 DIAGNOSIS — I4891 Unspecified atrial fibrillation: Secondary | ICD-10-CM

## 2011-11-24 DIAGNOSIS — K219 Gastro-esophageal reflux disease without esophagitis: Secondary | ICD-10-CM | POA: Insufficient documentation

## 2011-11-24 DIAGNOSIS — M1990 Unspecified osteoarthritis, unspecified site: Secondary | ICD-10-CM | POA: Insufficient documentation

## 2011-11-24 DIAGNOSIS — D649 Anemia, unspecified: Secondary | ICD-10-CM | POA: Diagnosis present

## 2011-11-24 DIAGNOSIS — I872 Venous insufficiency (chronic) (peripheral): Secondary | ICD-10-CM | POA: Diagnosis present

## 2011-11-24 LAB — BASIC METABOLIC PANEL
CO2: 29 mEq/L (ref 19–32)
Calcium: 8.5 mg/dL (ref 8.4–10.5)
Chloride: 89 mEq/L — ABNORMAL LOW (ref 96–112)
Glucose, Bld: 105 mg/dL — ABNORMAL HIGH (ref 70–99)
Sodium: 127 mEq/L — ABNORMAL LOW (ref 135–145)

## 2011-11-24 LAB — POCT I-STAT 3, ART BLOOD GAS (G3+)
O2 Saturation: 100 %
pCO2 arterial: 45 mmHg (ref 35.0–45.0)
pH, Arterial: 7.429 (ref 7.350–7.450)
pO2, Arterial: 228 mmHg — ABNORMAL HIGH (ref 80.0–100.0)

## 2011-11-24 LAB — URINE MICROSCOPIC-ADD ON

## 2011-11-24 LAB — URINALYSIS, ROUTINE W REFLEX MICROSCOPIC
Hgb urine dipstick: NEGATIVE
Nitrite: NEGATIVE
Protein, ur: NEGATIVE mg/dL
Specific Gravity, Urine: 1.006 (ref 1.005–1.030)
Urobilinogen, UA: 0.2 mg/dL (ref 0.0–1.0)

## 2011-11-24 LAB — OSMOLALITY, URINE: Osmolality, Ur: 191 mOsm/kg — ABNORMAL LOW (ref 390–1090)

## 2011-11-24 LAB — CBC
Hemoglobin: 9.3 g/dL — ABNORMAL LOW (ref 13.0–17.0)
MCH: 28.9 pg (ref 26.0–34.0)
RBC: 3.22 MIL/uL — ABNORMAL LOW (ref 4.22–5.81)
WBC: 6.5 10*3/uL (ref 4.0–10.5)

## 2011-11-24 LAB — SODIUM, URINE, RANDOM: Sodium, Ur: 70 mEq/L

## 2011-11-24 LAB — STREP PNEUMONIAE URINARY ANTIGEN: Strep Pneumo Urinary Antigen: NEGATIVE

## 2011-11-24 LAB — OSMOLALITY: Osmolality: 262 mOsm/kg — ABNORMAL LOW (ref 275–300)

## 2011-11-24 LAB — HIV ANTIBODY (ROUTINE TESTING W REFLEX): HIV: NONREACTIVE

## 2011-11-24 MED ORDER — NITROGLYCERIN 0.4 MG SL SUBL: 0.4000 mg | SUBLINGUAL_TABLET | SUBLINGUAL | Status: DC | PRN

## 2011-11-24 MED ORDER — PANTOPRAZOLE SODIUM 40 MG PO TBEC
40.0000 mg | DELAYED_RELEASE_TABLET | Freq: Every day | ORAL | Status: DC
  Administered 2011-11-24 – 2011-11-28 (×5): 40 mg via ORAL
  Filled 2011-11-24 (×4): qty 1

## 2011-11-24 MED ORDER — DUTASTERIDE 0.5 MG PO CAPS
0.5000 mg | ORAL_CAPSULE | Freq: Every day | ORAL | Status: DC
  Administered 2011-11-24 – 2011-11-28 (×5): 0.5 mg via ORAL
  Filled 2011-11-24 (×5): qty 1

## 2011-11-24 MED ORDER — OCUVITE PO TABS
1.0000 | ORAL_TABLET | Freq: Every day | ORAL | Status: DC
  Administered 2011-11-24 – 2011-11-28 (×5): 1 via ORAL
  Filled 2011-11-24 (×6): qty 1

## 2011-11-24 MED ORDER — ENOXAPARIN SODIUM 40 MG/0.4ML ~~LOC~~ SOLN
40.0000 mg | SUBCUTANEOUS | Status: DC
  Administered 2011-11-24 – 2011-11-28 (×5): 40 mg via SUBCUTANEOUS
  Filled 2011-11-24 (×5): qty 0.4

## 2011-11-24 MED ORDER — OMEGA-3-ACID ETHYL ESTERS 1 G PO CAPS
2.0000 g | ORAL_CAPSULE | Freq: Every day | ORAL | Status: DC
  Administered 2011-11-24 – 2011-11-28 (×5): 2 g via ORAL
  Filled 2011-11-24 (×7): qty 2

## 2011-11-24 MED ORDER — LOSARTAN POTASSIUM 25 MG PO TABS
25.0000 mg | ORAL_TABLET | Freq: Every day | ORAL | Status: DC
  Administered 2011-11-24 – 2011-11-28 (×5): 25 mg via ORAL
  Filled 2011-11-24 (×5): qty 1

## 2011-11-24 MED ORDER — SODIUM CHLORIDE 0.9 % IJ SOLN
3.0000 mL | Freq: Two times a day (BID) | INTRAMUSCULAR | Status: DC
  Administered 2011-11-24 – 2011-11-28 (×9): 3 mL via INTRAVENOUS

## 2011-11-24 MED ORDER — ESCITALOPRAM OXALATE 10 MG PO TABS
10.0000 mg | ORAL_TABLET | Freq: Every day | ORAL | Status: DC
  Administered 2011-11-24 – 2011-11-25 (×2): 10 mg via ORAL
  Filled 2011-11-24 (×3): qty 1

## 2011-11-24 MED ORDER — FERROUS SULFATE 325 (65 FE) MG PO TABS
325.0000 mg | ORAL_TABLET | Freq: Two times a day (BID) | ORAL | Status: DC
  Administered 2011-11-24 – 2011-11-28 (×9): 325 mg via ORAL
  Filled 2011-11-24 (×11): qty 1

## 2011-11-24 MED ORDER — SODIUM CHLORIDE 0.9 % IJ SOLN: 3.0000 mL | INTRAMUSCULAR | Status: DC | PRN

## 2011-11-24 MED ORDER — FUROSEMIDE 10 MG/ML IJ SOLN
40.0000 mg | Freq: Every day | INTRAMUSCULAR | Status: DC
  Filled 2011-11-24: qty 4

## 2011-11-24 MED ORDER — ALBUTEROL SULFATE (5 MG/ML) 0.5% IN NEBU
2.5000 mg | INHALATION_SOLUTION | Freq: Four times a day (QID) | RESPIRATORY_TRACT | Status: DC
  Administered 2011-11-24 – 2011-11-26 (×7): 2.5 mg via RESPIRATORY_TRACT
  Filled 2011-11-24 (×9): qty 0.5

## 2011-11-24 MED ORDER — HYDROXYZINE HCL 10 MG PO TABS
10.0000 mg | ORAL_TABLET | Freq: Every day | ORAL | Status: DC
  Administered 2011-11-24 – 2011-11-27 (×4): 10 mg via ORAL
  Filled 2011-11-24 (×5): qty 1

## 2011-11-24 MED ORDER — POLYETHYLENE GLYCOL 3350 17 G PO PACK
17.0000 g | PACK | Freq: Every day | ORAL | Status: DC
  Administered 2011-11-24 – 2011-11-28 (×5): 17 g via ORAL
  Filled 2011-11-24 (×5): qty 1

## 2011-11-24 MED ORDER — VANCOMYCIN HCL IN DEXTROSE 1-5 GM/200ML-% IV SOLN
1000.0000 mg | Freq: Two times a day (BID) | INTRAVENOUS | Status: DC
  Filled 2011-11-24: qty 200

## 2011-11-24 MED ORDER — PIPERACILLIN-TAZOBACTAM 3.375 G IVPB
3.3750 g | Freq: Three times a day (TID) | INTRAVENOUS | Status: DC
  Administered 2011-11-24: 3.375 g via INTRAVENOUS
  Filled 2011-11-24 (×3): qty 50

## 2011-11-24 MED ORDER — FUROSEMIDE 10 MG/ML IJ SOLN
40.0000 mg | Freq: Two times a day (BID) | INTRAMUSCULAR | Status: DC
  Administered 2011-11-24: 40 mg via INTRAVENOUS
  Filled 2011-11-24 (×3): qty 4

## 2011-11-24 MED ORDER — ALBUTEROL SULFATE (5 MG/ML) 0.5% IN NEBU: 2.5000 mg | INHALATION_SOLUTION | RESPIRATORY_TRACT | Status: DC | PRN

## 2011-11-24 MED ORDER — OXYBUTYNIN CHLORIDE 5 MG PO TABS
5.0000 mg | ORAL_TABLET | Freq: Every day | ORAL | Status: DC
  Administered 2011-11-24 – 2011-11-27 (×4): 5 mg via ORAL
  Filled 2011-11-24 (×5): qty 1

## 2011-11-24 MED ORDER — ENOXAPARIN SODIUM 40 MG/0.4ML ~~LOC~~ SOLN: 40.0000 mg | SUBCUTANEOUS | Status: DC

## 2011-11-24 MED ORDER — SODIUM CHLORIDE 0.9 % IV SOLN: 250.0000 mL | INTRAVENOUS | Status: DC | PRN

## 2011-11-24 MED ORDER — CARVEDILOL 6.25 MG PO TABS
6.2500 mg | ORAL_TABLET | Freq: Two times a day (BID) | ORAL | Status: DC
  Administered 2011-11-24 – 2011-11-28 (×10): 6.25 mg via ORAL
  Filled 2011-11-24 (×12): qty 1

## 2011-11-24 NOTE — Progress Notes (Signed)
Pt arrived from 3300 into room 6707. Pt is alert and oriented, but forgetful. VSS. No signs of acute distress. No c/o pain. Oriented pt to room, and call bell. Instructed pt to call before getting out of bed. Bed alarm set.

## 2011-11-24 NOTE — ED Notes (Signed)
Pt is communicative; no signs of distress; reports nothing else needed to make him more comfortable.

## 2011-11-24 NOTE — Consult Note (Signed)
Admit date: 11/23/2011 Referring Physician  Dr. Roanna Epley Primary Physician Florentina Jenny, MD Primary Cardiologist  Dr. Mayford Knife Reason for Consultation  Atrial fibrillation  HPI: [name] is a very pleasant 78 year old male with long-standing atrial fibrillation, not deemed a Coumadin candidate with diastolic heart failure, hypertension who was sent from [facility] for acute onset shortness of breath. He states that he was in his usual health when last night he began to struggle with his breathing quite significantly. He was to With O2 saturation of 81% when EMS arrived and he was placed on BiPAP and his sats improved. He was given a dose of IV Lasix 40 mg because of chest x-ray demonstrating pulmonary vascular congestion. Since he has been here, he is now feeling much better. He states that he has had productive phlegm for quite some time. His breathing is much improved currently. He was in atrial fibrillation but his heart rates were well-controlled below 100. I personally reviewed telemetry from 3300. He states that he was in the movie [place].   PMH:   Past Medical History  Diagnosis Date  . Cellulitis   . Arthritis   . GERD (gastroesophageal reflux disease)   . Hypokalemia   . Hypertension   . Atrial fibrillation   . CHF (congestive heart failure)   . Ankle deformity     broke right ankle and it "was set" no surgery    PSH:   Past Surgical History  Procedure Date  . Knee surgery age 72    broke knee cap in [facility] - doesn't remember which knee   Allergies:  Review of patient's allergies indicates no known allergies. Prior to Admit Meds:   Prescriptions prior to admission  Medication Sig Dispense Refill  . Calcium Carbonate-Vitamin D (CALCIUM 500 + D PO) Take 1 tablet by mouth daily.      . carvedilol (COREG) 6.25 MG tablet Take 6.25 mg by mouth 2 (two) times daily with a meal.      . dutasteride (AVODART) 0.5 MG capsule Take 0.5 mg by mouth daily.      Marland Kitchen  escitalopram (LEXAPRO) 10 MG tablet Take 10 mg by mouth daily.      . ferrous sulfate 325 (65 FE) MG tablet Take 325 mg by mouth 2 (two) times daily.      . fish oil-omega-3 fatty acids 1000 MG capsule Take 2 g by mouth daily.      . hydrOXYzine (ATARAX/VISTARIL) 10 MG tablet Take 10 mg by mouth at bedtime.      Marland Kitchen losartan (COZAAR) 25 MG tablet Take 25 mg by mouth daily.      . Multiple Vitamins-Minerals (DECUBI-VITE PO) Take 1 tablet by mouth daily.      . multivitamin-lutein (OCUVITE-LUTEIN) CAPS Take 1 capsule by mouth daily.      . nitroGLYCERIN (NITROSTAT) 0.4 MG SL tablet Place 0.4 mg under the tongue every 5 (five) minutes as needed. For chest pain      . oxybutynin (DITROPAN) 5 MG tablet Take 5 mg by mouth at bedtime.       . pantoprazole (PROTONIX) 40 MG tablet Take 40 mg by mouth daily.      . polyethylene glycol (MIRALAX / GLYCOLAX) packet Take 17 g by mouth daily.      Marland Kitchen Propylene Glycol (SYSTANE BALANCE OP) Place 1 drop into both eyes 2 (two) times daily.       Fam HX:   History reviewed. No pertinent family history.  Social HX:    History   Social History  . Marital Status: Single    Spouse Name: N/A    Number of Children: N/A  . Years of Education: N/A   Occupational History  . Not on file.   Social History Main Topics  . Smoking status: Never Smoker   . Smokeless tobacco: Never Used  . Alcohol Use: Yes     occasional wine  . Drug Use: No  . Sexually Active:    Other Topics Concern  . Not on file   Social History Narrative  . No narrative on file     ROS:  Denies any recent fevers, headaches. Positive for increasing fatigue, recent shortness of breath, lower extremity edema. No recent strokelike symptoms. Denies any chest pain. All 11 ROS were addressed and are negative except what is stated in the HPI  Physical Exam: Blood pressure 99/63, pulse 71, temperature 97.2 F (36.2 C), temperature source Oral, resp. rate 16, height 5\' 10"  (1.778 m), weight 71.2 kg  (156 lb 15.5 oz), SpO2 98.00%.    General: Elderly appearing male in bed in no acute distress Head: Eyes PERRLA, No xanthomas.   Normal cephalic and atramatic  Lungs:  Diminished breath sounds bilaterally otherwise no associated wheezes. He appears to have normal respiratory effort. Heart:  Irregularly irregular, 2/6 systolic murmur right upper sternal border with soft radiation to the carotid arteries. Pulses are 2+ & equal.      No JVD.  No abdominal bruits.  Abdomen: Bowel sounds are positive, abdomen soft and non-tender without masses. No hepatosplenomegaly. Msk:  Back normal. Normal strength and tone for age. Extremities:  1-2+ edema bilaterally. No clubbing, cyanosis DP +1 Neuro: Alert and oriented X 3, non-focal, MAE x 4 GU: Deferred Rectal: Deferred Psych:  Good affect, responds appropriately    Labs:   Lab Results  Component Value Date   WBC 6.5 11/24/2011   HGB 9.3* 11/24/2011   HCT 27.5* 11/24/2011   MCV 85.4 11/24/2011   PLT 257 11/24/2011    Lab 11/24/11 0446  NA 127*  K 3.8  CL 89*  CO2 29  BUN 10  CREATININE 0.58  CALCIUM 8.5  PROT --  BILITOT --  ALKPHOS --  ALT --  AST --  GLUCOSE 105*   No results found for this basename: PTT   Lab Results  Component Value Date   INR 1.18 11/01/2011   INR 1.16 10/31/2011   INR 1.12 03/05/2011   Lab Results  Component Value Date   CKTOTAL 104 03/03/2011   CKMB 2.1 11/01/2010   TROPONINI <0.30 02/27/2011     Lab Results  Component Value Date   CHOL 125 10/05/2010   CHOL  Value: 122        ATP III CLASSIFICATION:  <200     mg/dL   Desirable  161-096  mg/dL   Borderline High  >=045    mg/dL   High        4/0/9811   CHOL 182 07/02/2007   Lab Results  Component Value Date   HDL 73 10/05/2010   HDL 56 01/14/4781   HDL 65 07/02/2007   Lab Results  Component Value Date   LDLCALC  Value: 46        Total Cholesterol/HDL:CHD Risk Coronary Heart Disease Risk Table                     Men   Women  1/2 Average  Risk   3.4    3.3  Average Risk       5.0   4.4  2 X Average Risk   9.6   7.1  3 X Average Risk  23.4   11.0        Use the calculated Patient Ratio above and the CHD Risk Table to determine the patient's CHD Risk.        ATP III CLASSIFICATION (LDL):  <100     mg/dL   Optimal  161-096  mg/dL   Near or Above                    Optimal  130-159  mg/dL   Borderline  045-409  mg/dL   High  >811     mg/dL   Very High 01/28/7828   LDLCALC  Value: 58        Total Cholesterol/HDL:CHD Risk Coronary Heart Disease Risk Table                     Men   Women  1/2 Average Risk   3.4   3.3  Average Risk       5.0   4.4  2 X Average Risk   9.6   7.1  3 X Average Risk  23.4   11.0        Use the calculated Patient Ratio above and the CHD Risk Table to determine the patient's CHD Risk.        ATP III CLASSIFICATION (LDL):  <100     mg/dL   Optimal  562-130  mg/dL   Near or Above                    Optimal  130-159  mg/dL   Borderline  865-784  mg/dL   High  >696     mg/dL   Very High 06/24/5282   LDLCALC 95 07/02/2007   Lab Results  Component Value Date   TRIG 32 10/05/2010   TRIG 39 08/23/2010   TRIG 112 07/02/2007   Lab Results  Component Value Date   CHOLHDL 1.7 10/05/2010   CHOLHDL 2.2 08/23/2010   CHOLHDL 2.8 Ratio 07/02/2007   No results found for this basename: LDLDIRECT      Radiology:  Dg Chest Portable 1 View  11/23/2011  *RADIOLOGY REPORT*  Clinical Data: Short of breath  PORTABLE CHEST - 1 VIEW  Comparison: 11/02/2011  Findings: Very large hiatal hernia is stable.  Moderate cardiomegaly.  Left infrahilar patchy densities have developed.  No pneumothorax.  No definite pleural effusion.  IMPRESSION: Stable large hiatal hernia  Left infrahilar patchy airspace disease.  Original Report Authenticated By: Donavan Burnet, M.D.   Personally viewed.  EKG:  Atrial fibrillation with incomplete right bundle branch block, telemetry personally viewed   Echocardiogram: Personally viewed shows normal ejection fraction with basal inferior  wall hypokinesis, diastolic dysfunction with elevated left atrial filling pressure, moderate aortic stenosis, moderate mitral regurgitation.  ASSESSMENT/PLAN:   78 year old male with persistent atrial fibrillation, acute shortness of breath possibly in part from acute on chronic diastolic heart failure, pneumonia with moderate aortic stenosis, moderate mitral regurgitation.  1. atrial fibrillation - he is a non-Coumadin candidate as has been deemed in the past. Excellent rate control. No changes made.  2. acute on chronic diastolic heart failure-he seemed to respond well to IV Lasix. I agree with restarting. I do believe that a low-dose of Lasix  such as 40 mg once a day orally as an outpatient may be helpful. This was recently stopped because of hyponatremia with acute mental status changes. His BNP was elevated at 4700. 2-D echocardiogram shows normal EF, diastolic dysfunction. Moderate aortic stenosis should not be playing a hemodynamic role at this time.  3. Hyponatremia -- this is being monitored very closely since Lasix is being administered. His hyponatremia may be in part from fluid overload and Lasix may in fact help him. We have seen an increase in his sodium from 121 up to 127. Continue to monitor.  4. Healthcare associated pneumonia-he is a left-sided infiltrate on chest x-ray. He is being treated with IV antibiotics.  5. anemia of chronic disease noted. Hemoglobin 9.3.   We will follow along with you.  Donato Schultz, MD  11/24/2011  3:58 PM

## 2011-11-24 NOTE — Progress Notes (Signed)
11/24/2011 1405 Called report to [place] on 6700. D/C'd foley catheter without difficulty and applied condom cath.  Patient ready for transfer.  Jaclyn Shaggy Everhart

## 2011-11-24 NOTE — ED Provider Notes (Signed)
History     CSN: [id number]  Arrival date & time 11/23/11  2130   First MD Initiated Contact with Patient 11/23/11 2133      Chief Complaint  Patient presents with  . Shortness of Breath    (Consider location/radiation/quality/duration/timing/severity/associated sxs/prior treatment) Patient is a 78 y.o. male presenting with shortness of breath. The history is provided by the patient and the EMS personnel.  Shortness of Breath  The current episode started today. The problem occurs frequently. The problem has been unchanged. The problem is moderate. Relieved by: CPAP. Nothing aggravates the symptoms. Associated symptoms include shortness of breath. Pertinent negatives include no chest pain, no chest pressure, no orthopnea, no fever, no rhinorrhea, no sore throat, no cough and no wheezing. There was no intake of a foreign body. He has had prior hospitalizations. Urine output has been normal. There were no sick contacts. Recently, medical care has been given at this facility. Services received include medications given.    Past Medical History  Diagnosis Date  . Cellulitis   . Arthritis   . GERD (gastroesophageal reflux disease)   . Hypokalemia   . Hypertension   . Atrial fibrillation   . CHF (congestive heart failure)   . Ankle deformity     broke right ankle and it "was set" no surgery    Past Surgical History  Procedure Date  . Knee surgery age 68    broke knee cap in [facility] - doesn't remember which knee    No family history on file.  History  Substance Use Topics  . Smoking status: Never Smoker   . Smokeless tobacco: Never Used  . Alcohol Use: Yes     occasional wine      Review of Systems  Constitutional: Negative for fever, activity change, appetite change and fatigue.  HENT: Negative for congestion, sore throat, facial swelling, rhinorrhea, trouble swallowing, neck pain, neck stiffness, voice change and sinus pressure.   Eyes: Negative.   Respiratory: Positive  for shortness of breath. Negative for cough, choking, chest tightness and wheezing.   Cardiovascular: Negative for chest pain and orthopnea.  Gastrointestinal: Negative for nausea, vomiting and abdominal pain.  Genitourinary: Negative for dysuria, urgency, frequency, hematuria, flank pain and difficulty urinating.  Musculoskeletal: Negative for back pain and gait problem.  Skin: Negative for rash and wound.  Neurological: Negative for facial asymmetry, weakness, numbness and headaches.  Psychiatric/Behavioral: Negative for behavioral problems, confusion and agitation. The patient is not nervous/anxious and is not hyperactive.   All other systems reviewed and are negative.    Allergies  Review of patient's allergies indicates no known allergies.  Home Medications   Current Outpatient Rx  Name Route Sig Dispense Refill  . CALCIUM 500 + D PO Oral Take 1 tablet by mouth daily.    Marland Kitchen CARVEDILOL 6.25 MG PO TABS Oral Take 6.25 mg by mouth 2 (two) times daily with a meal.    . DUTASTERIDE 0.5 MG PO CAPS Oral Take 0.5 mg by mouth daily.    Marland Kitchen ESCITALOPRAM OXALATE 10 MG PO TABS Oral Take 10 mg by mouth daily.    Marland Kitchen FERROUS SULFATE 325 (65 FE) MG PO TABS Oral Take 325 mg by mouth 2 (two) times daily.    . OMEGA-3 FATTY ACIDS 1000 MG PO CAPS Oral Take 2 g by mouth daily.    Marland Kitchen HYDROXYZINE HCL 10 MG PO TABS Oral Take 10 mg by mouth at bedtime.    Marland Kitchen LOSARTAN POTASSIUM 25 MG  PO TABS Oral Take 25 mg by mouth daily.    Marland Kitchen DECUBI-VITE PO Oral Take 1 tablet by mouth daily.    . OCUVITE-LUTEIN PO CAPS Oral Take 1 capsule by mouth daily.    Marland Kitchen NITROGLYCERIN 0.4 MG SL SUBL Sublingual Place 0.4 mg under the tongue every 5 (five) minutes as needed. For chest pain    . OXYBUTYNIN CHLORIDE 5 MG PO TABS Oral Take 5 mg by mouth at bedtime.     Marland Kitchen PANTOPRAZOLE SODIUM 40 MG PO TBEC Oral Take 40 mg by mouth daily.    Marland Kitchen POLYETHYLENE GLYCOL 3350 PO PACK Oral Take 17 g by mouth daily.    Frazier Butt BALANCE OP Both Eyes Place  1 drop into both eyes 2 (two) times daily.      BP 114/86  Pulse 95  Resp 16  SpO2 100%  Physical Exam  Nursing note and vitals reviewed. Constitutional: He appears well-developed and well-nourished. No distress.  HENT:  Head: Normocephalic and atraumatic.  Right Ear: External ear normal.  Left Ear: External ear normal.  Mouth/Throat: No oropharyngeal exudate.  Eyes: Conjunctivae and EOM are normal. Pupils are equal, round, and reactive to light. Right eye exhibits no discharge. Left eye exhibits no discharge.  Neck: Normal range of motion. Neck supple. JVD present. No tracheal deviation present. No thyromegaly present.  Cardiovascular: Normal rate, regular rhythm, normal heart sounds and intact distal pulses.  Exam reveals no gallop and no friction rub.   No murmur heard. Pulmonary/Chest: Effort normal. No respiratory distress. He has decreased breath sounds (diffusely throughout). He has no wheezes. He has rales in the right lower field and the left lower field. He exhibits no tenderness.  Abdominal: Soft. Bowel sounds are normal. He exhibits no distension. There is no tenderness. There is no rebound and no guarding.  Musculoskeletal: Normal range of motion. He exhibits edema. He exhibits no tenderness.  Lymphadenopathy:    He has no cervical adenopathy.  Neurological: No cranial nerve deficit.       Patient oriented to self and place but not to time. Eyes closed but follows commands  Skin: Skin is warm and dry. No rash noted. He is not diaphoretic. No pallor.  Psychiatric: He has a normal mood and affect. His behavior is normal.    ED Course  Procedures (including critical care time)  Labs Reviewed  PRO B NATRIURETIC PEPTIDE - Abnormal; Notable for the following:    Pro B Natriuretic peptide (BNP) 4727.0 (*)     All other components within normal limits  CBC WITH DIFFERENTIAL - Abnormal; Notable for the following:    RBC 3.57 (*)     Hemoglobin 10.3 (*)     HCT 30.9 (*)       Neutrophils Relative 82 (*)     Lymphocytes Relative 11 (*)     All other components within normal limits  BASIC METABOLIC PANEL - Abnormal; Notable for the following:    Sodium 121 (*)     Chloride 86 (*)     Glucose, Bld 155 (*)     All other components within normal limits  POCT I-STAT TROPONIN I  BLOOD GAS, ARTERIAL   Dg Chest Portable 1 View  11/23/2011  *RADIOLOGY REPORT*  Clinical Data: Short of breath  PORTABLE CHEST - 1 VIEW  Comparison: 11/02/2011  Findings: Very large hiatal hernia is stable.  Moderate cardiomegaly.  Left infrahilar patchy densities have developed.  No pneumothorax.  No definite pleural effusion.  IMPRESSION: Stable large hiatal hernia  Left infrahilar patchy airspace disease.  Original Report Authenticated By: Donavan Burnet, M.D.     No diagnosis found.    MDM   Results for orders placed during the hospital encounter of 11/23/11  PRO B NATRIURETIC PEPTIDE      Component Value Range   Pro B Natriuretic peptide (BNP) 4727.0 (*) 0 - 450 pg/mL  CBC WITH DIFFERENTIAL      Component Value Range   WBC 7.0  4.0 - 10.5 K/uL   RBC 3.57 (*) 4.22 - 5.81 MIL/uL   Hemoglobin 10.3 (*) 13.0 - 17.0 g/dL   HCT 45.4 (*) 09.8 - 11.9 %   MCV 86.6  78.0 - 100.0 fL   MCH 28.9  26.0 - 34.0 pg   MCHC 33.3  30.0 - 36.0 g/dL   RDW 14.7  82.9 - 56.2 %   Platelets 316  150 - 400 K/uL   Neutrophils Relative 82 (*) 43 - 77 %   Neutro Abs 5.8  1.7 - 7.7 K/uL   Lymphocytes Relative 11 (*) 12 - 46 %   Lymphs Abs 0.8  0.7 - 4.0 K/uL   Monocytes Relative 4  3 - 12 %   Monocytes Absolute 0.3  0.1 - 1.0 K/uL   Eosinophils Relative 2  0 - 5 %   Eosinophils Absolute 0.2  0.0 - 0.7 K/uL   Basophils Relative 0  0 - 1 %   Basophils Absolute 0.0  0.0 - 0.1 K/uL  BASIC METABOLIC PANEL      Component Value Range   Sodium 121 (*) 135 - 145 mEq/L   Potassium 4.5  3.5 - 5.1 mEq/L   Chloride 86 (*) 96 - 112 mEq/L   CO2 21  19 - 32 mEq/L   Glucose, Bld 155 (*) 70 - 99 mg/dL   BUN  11  6 - 23 mg/dL   Creatinine, Ser 1.30  0.50 - 1.35 mg/dL   Calcium 8.5  8.4 - 86.5 mg/dL   GFR calc non Af Amer >90  >90 mL/min   GFR calc Af Amer >90  >90 mL/min  POCT I-STAT TROPONIN I      Component Value Range   Troponin i, poc 0.00  0.00 - 0.08 ng/mL   Comment 3           POCT I-STAT 3, BLOOD GAS (G3+)      Component Value Range   pH, Arterial 7.429  7.350 - 7.450   pCO2 arterial 45.0  35.0 - 45.0 mmHg   pO2, Arterial 228.0 (*) 80.0 - 100.0 mmHg   Bicarbonate 29.8 (*) 20.0 - 24.0 mEq/L   TCO2 31  0 - 100 mmol/L   O2 Saturation 100.0     Acid-Base Excess 5.0 (*) 0.0 - 2.0 mmol/L   Collection site RADIAL, ALLEN'S TEST ACCEPTABLE     Drawn by Operator     Sample type ARTERIAL      DG Chest Portable 1 View (Final result)   Result time:11/23/11 2236    Final result by Rad Results In Interface (11/23/11 22:36:18)    Narrative:   *RADIOLOGY REPORT*  Clinical Data: Short of breath  PORTABLE CHEST - 1 VIEW  Comparison: 11/02/2011  Findings: Very large hiatal hernia is stable. Moderate cardiomegaly. Left infrahilar patchy densities have developed. No pneumothorax. No definite pleural effusion.  IMPRESSION: Stable large hiatal hernia  Left infrahilar patchy airspace disease.  Original Report Authenticated By: Merton Border  Joseph Art, M.D.   78 year old male patient presents with acute shortness of breath. Patient was recently admitted here with altered mental status and hyponatremia. Patient went back to nursing home. Patient was doing well there when apparently per EMS patient called out that he was short of breath. EMS was called and patient was placed on CPAP. When patient arrived his oxygen saturations were 100% with normal vital signs. Patient was alert following commands oriented to person and place but not to time. Patient able to give thumbs up with both hands and both feet and otherwise normal neurological exam. Patient was able to answer review of system questions. Patient  denied chest pain abdominal pain nausea vomiting. Patient did say he felt short of breath. Patient with significant JVD and 2+ pitting edema along with Rales bilaterally and distant lung sounds. Concern for possible exacerbation of chronic congestive heart failure versus flash pulmonary edema. We'll also screen for ACS. EKG unchanged chest x-ray shows some airspace disease. Labs otherwise at baseline with elevated BNP at baseline. Patient also get hyponatremic. Patient was given Lasix to help improve her respiratory status. Patient was able to be weaned off of CPAP on to 6 L face mask oxygen. Patient given vancomycin Zosyn for possible pneumonia and lung. Patient admitted to step down unit with likely congestive heart failure exacerbation with possible infectious pneumonia.  Case discussed with Dr. Blanche East, MD 11/24/11 [phone]

## 2011-11-24 NOTE — Progress Notes (Signed)
Clinical Social Work Department BRIEF PSYCHOSOCIAL ASSESSMENT 11/24/2011  Patient:  [name], [name]     Account Number:  [id number]     Admit date:  11/23/2011  Clinical Social Worker:  Dennison Bulla  Date/Time:  11/24/2011 11:15 AM  Referred by:  Physician  Date Referred:  11/24/2011 Referred for  SNF Placement   Other Referral:   Interview type:  Patient Other interview type:    PSYCHOSOCIAL DATA Living Status:  FACILITY Admitted from facility:  [facility] Level of care:  Skilled Nursing Facility Primary support name:  Jamesetta So Primary support relationship to patient:  FAMILY Degree of support available:   Adequate    CURRENT CONCERNS Current Concerns  Post-Acute Placement   Other Concerns:    SOCIAL WORK ASSESSMENT / PLAN CSW received referral due to patient being admitted from a facility. CSW reviewed chart and met with patient at bedside. No visitors were present.    CSW introduced myself and explained role. Patient reported he is from [facility] and has lived there for over a year. CSW confirmed that patient wanted to return there at dc and explained dc process. CSW received permission to contact SNF regarding patient. CSW called SNF and left a message regarding patient returning at dc. CSW assisted patient in calling his cousin and he reported he would keep her updated on plan.    CSW completed FL2 and will continue to follow.   Assessment/plan status:  Psychosocial Support/Ongoing Assessment of Needs Other assessment/ plan:   Information/referral to community resources:   Will return to SNF    PATIENT'S/FAMILY'S RESPONSE TO PLAN OF CARE: Patient was alert and oriented. Patient receptive to CSW consult and appreciative of help to contact family. Patient plans to return to SNF at dc.

## 2011-11-24 NOTE — Progress Notes (Signed)
Clinical Social Work  Full assessment completed but unable to transfer document from [facility] to [employer]. Will be imported at a later time.  Patient from [facility]. FL2 completed and in chart.  [place], [place] [phone]

## 2011-11-24 NOTE — Progress Notes (Signed)
Utilization review completed.  

## 2011-11-24 NOTE — Progress Notes (Signed)
ANTIBIOTIC CONSULT NOTE - INITIAL  Pharmacy Consult for Vancomycin and Zosyn Indication: suspected PNA  No Known Allergies  Patient Measurements:  Ht 68 in Wt ~72 kg  Vital Signs: Temp: 97.2 F (36.2 C) (07/11 0229) Temp src: Axillary (07/11 0229) BP: 126/62 mmHg (07/11 0229) Pulse Rate: 86  (07/11 0229) Intake/Output from previous day: 07/10 0701 - 07/11 0700 In: -  Out: 2000 [Urine:2000] Intake/Output from this shift: Total I/O In: -  Out: 2000 [Urine:2000]  Labs:  [facility] 11/23/11 2204  WBC 7.0  HGB 10.3*  PLT 316  LABCREA --  CREATININE 0.56   The CrCl is unknown because both a height and weight (above a minimum accepted value) are required for this calculation. No results found for this basename: VANCOTROUGH:2,VANCOPEAK:2,VANCORANDOM:2,GENTTROUGH:2,GENTPEAK:2,GENTRANDOM:2,TOBRATROUGH:2,TOBRAPEAK:2,TOBRARND:2,AMIKACINPEAK:2,AMIKACINTROU:2,AMIKACIN:2, in the last 72 hours   Microbiology: Recent Results (from the past 720 hour(s))  MRSA PCR SCREENING     Status: Abnormal   Collection Time   11/01/11  2:39 AM      Component Value Range Status Comment   MRSA by PCR POSITIVE (*) NEGATIVE Final     Medical History: Past Medical History  Diagnosis Date  . Cellulitis   . Arthritis   . GERD (gastroesophageal reflux disease)   . Hypokalemia   . Hypertension   . Atrial fibrillation   . CHF (congestive heart failure)   . Ankle deformity     broke right ankle and it "was set" no surgery    Medications:  Prescriptions prior to admission  Medication Sig Dispense Refill  . Calcium Carbonate-Vitamin D (CALCIUM 500 + D PO) Take 1 tablet by mouth daily.      . carvedilol (COREG) 6.25 MG tablet Take 6.25 mg by mouth 2 (two) times daily with a meal.      . dutasteride (AVODART) 0.5 MG capsule Take 0.5 mg by mouth daily.      Marland Kitchen escitalopram (LEXAPRO) 10 MG tablet Take 10 mg by mouth daily.      . ferrous sulfate 325 (65 FE) MG tablet Take 325 mg by mouth 2 (two)  times daily.      . fish oil-omega-3 fatty acids 1000 MG capsule Take 2 g by mouth daily.      . hydrOXYzine (ATARAX/VISTARIL) 10 MG tablet Take 10 mg by mouth at bedtime.      Marland Kitchen losartan (COZAAR) 25 MG tablet Take 25 mg by mouth daily.      . Multiple Vitamins-Minerals (DECUBI-VITE PO) Take 1 tablet by mouth daily.      . multivitamin-lutein (OCUVITE-LUTEIN) CAPS Take 1 capsule by mouth daily.      . nitroGLYCERIN (NITROSTAT) 0.4 MG SL tablet Place 0.4 mg under the tongue every 5 (five) minutes as needed. For chest pain      . oxybutynin (DITROPAN) 5 MG tablet Take 5 mg by mouth at bedtime.       . pantoprazole (PROTONIX) 40 MG tablet Take 40 mg by mouth daily.      . polyethylene glycol (MIRALAX / GLYCOLAX) packet Take 17 g by mouth daily.      Marland Kitchen Propylene Glycol (SYSTANE BALANCE OP) Place 1 drop into both eyes 2 (two) times daily.       Assessment: 78 y.o. Male presents from [facility] with SOB. To begin broad spectrum antibiotics for suspected PNA. Received Zosyn 3.375gm and Vancomycin 1gm IV in ED ~0030. Estimated CrCl ~70 ml/min.  Goal of Therapy:  Vancomycin trough level 15-20 mcg/ml  Plan:  1.  Zosyn 3.375 gm IV q8h. Each dose over 4 hours 2. Vancomycin 1gm IV q12h. 3. Will f/u microbiological data, renal function and clinical condition.  Christoper Fabian, PharmD, BCPS Clinical pharmacist, pager [phone] 11/24/2011,3:18 AM

## 2011-11-24 NOTE — ED Provider Notes (Signed)
I saw and evaluated the patient, reviewed the resident's note and I agree with the findings and plan.   Dione Booze, MD 11/24/11 [phone]

## 2011-11-24 NOTE — Progress Notes (Signed)
  Echocardiogram 2D Echocardiogram has been performed.  [name] 11/24/2011, 3:26 PM

## 2011-11-24 NOTE — Progress Notes (Signed)
[facility] PROGRESS NOTE  [name] BJY:[id number] DOB: February 27, 1927 DOA: 11/23/2011 PCP: Florentina Jenny, MD  Assessment/Plan: Principal Problem:  *Acute exacerbation of CHF (congestive heart failure) *This seems to be the primary etiology to the patient's respiratory symptoms *Aggressive diuresis his symptoms have markedly improved *Since admission he has diuresed off 6050 cc *Last 2-D echocardiogram was performed in 2011 shows normal systolic function although he did have some LVH and likely has underlying diastolic dysfunction; is also noted to have moderate mitral valve regurgitation and this could also be contributing to heart failure symptoms *2-D echocardiogram has been ordered this admission  Active Problems:  Acute respiratory failure with hypoxia related to:  A) HCAP (healthcare-associated pneumonia) vs  CHF *As noted above suspect primary etiology to respiratory failure is CHF * cardiology managing diuretics.  *Doubt infectious process since at presentation patient was without fever, productive cough or leukocytosis *We have discontinued the broad-spectrum antibiotics ordered at presentation   Atrial fibrillation, chronic *Currently rate controlled and per cardiology is not a Coumadin candidate.    Hypertension *Continue carvedilol and losartan   Hyponatremia *Sodium has increased after diuresis and suspect primarily related to dilution from heart failure   GERD (gastroesophageal reflux disease) *Continue protonix   Arthritis   Anemia *Hemoglobin stable   Venous stasis dermatitis of bilateral lower extremity *Chronic issue but may benefit from evaluation by wound ostomy care nurse for intervention such as Unna boots but noting he does not have any draining wounds at this point  I have examined the patient, reviewed the chart, modified the current note and agree with it.   Calvert Cantor, MD [phone] Code Status:  DO NOT RESUSCITATE  Family  Communication:  Not indicated today patient within first 24 hours of admission and clinically stable  Disposition Plan:  Transfer to medical floor  Brief narrative: 78 y/o male with hx of CHF ( last echo in the system from 2011 with normal LVEF; [facility] Afib not on coumadin , anemia, HTN and recently admitted for AMS with hyponatremia and thought to be due to dehydration vs SIADH and treated with IV fluids and lasix held was sent from rehab for acute onset shortness of breath since 1 day. History primarily obtained from rehab and ED notes as patient was not able to provide a good history. As per the nurse patient was complaining of shortness of breath and was tachypneic with o2 sat of 81 on 3 L. EMS arrived and was paced on BiPAP and his sats improved to 90s. He was afebrile and the staff did not notice any change in his mental status. On arrival to ED he was noted to be in acute CHF and given a dose of 40 mg IV lasix after foley placed in. Patient improved his sat and placed on face mask at 6 L and his sats now stable in high 90s. [facility] service was called for admission to stepdown unit given acute respiratory failure with CHF exacerbation and possibly associated HCAP and need for utilization of noninvasive can't ventilation.    Consultants:  None  Procedures:  None  Antibiotics:  Zosyn and vancomycin initiated 11/24/2011 and discontinued on same date  HPI/Subjective: Elderly male who now endorses he is breathing much better since admission. Currently denies chest pain or shortness of breath. 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  Objective: Filed  Vitals:   11/24/11 0427 11/24/11 0737 11/24/11 1200 11/24/11 1420  BP:  107/69 99/63   Pulse:  71 71   Temp:  97.4 F (36.3 C) 97.2 F (36.2 C)   TempSrc:  Oral Oral   Resp:   16 16   Height:      Weight:      SpO2: 100% 99% 100% 98%    Intake/Output Summary (Last 24 hours) at 11/24/11 1451 Last data filed at 11/24/11 1200  Gross per 24 hour  Intake      0 ml  Output   6050 ml  Net  -6050 ml    Exam:  General appearance: alert, cooperative, appears older than stated age and no distress Resp: clear to auscultation bilaterally, has been weaned from BiPAP to 4 L nasal cannula oxygen with saturations 98% respiratory effort is nonlabored and he is without tachypnea Cardio: Irregular rate and treat febrile rhythm, S1, S2 normal, no murmur, click, rub or gallop; has chronic edema of the bilateral lower extremities with skin changes consistent with chronic venous stasis dermatitis GI: soft, non-tender; bowel sounds normal; no masses,  no organomegaly Extremities: extremities normal, atraumatic, patient has no obvious joint erythema or effusions but this is limited by his chronic edema that extends up to the knees; patient also has chronic deformities of the lower extremities causing both feet to be externally rotated outward like a Advertising account planner but he also has changes in the feet that appear to be consistent with Charcot foot  Neurologic: Grossly normal although he may be somewhat confused  Data Reviewed: Basic Metabolic Panel:  Lab 11/24/11 1610 11/23/11 2204  NA 127* 121*  K 3.8 4.5  CL 89* 86*  CO2 29 21  GLUCOSE 105* 155*  BUN 10 11  CREATININE 0.58 0.56  CALCIUM 8.5 8.5  MG -- --  PHOS -- --   Liver Function Tests: No results found for this basename: AST:5,ALT:5,ALKPHOS:5,BILITOT:5,PROT:5,ALBUMIN:5 in the last 168 hours No results found for this basename: LIPASE:5,AMYLASE:5 in the last 168 hours No results found for this basename: AMMONIA:5 in the last 168 hours CBC:  Lab 11/24/11 0446 11/23/11 2204  WBC 6.5 7.0  NEUTROABS -- 5.8  HGB 9.3* 10.3*  HCT 27.5* 30.9*  MCV 85.4 86.6  PLT 257 316   Cardiac Enzymes: No results found for this  basename: CKTOTAL:5,CKMB:5,CKMBINDEX:5,TROPONINI:5 in the last 168 hours BNP (last 3 results)  Basename 11/23/11 2141 10/31/11 1450 02/28/11 0510  PROBNP 4727.0* 5129.0* 2946.0*   CBG: No results found for this basename: GLUCAP:5 in the last 168 hours  Recent Results (from the past 240 hour(s))  MRSA PCR SCREENING     Status: Abnormal   Collection Time   11/24/11  3:15 AM      Component Value Range Status Comment   MRSA by PCR POSITIVE (*) NEGATIVE Final      Studies: Dg Chest 2 View  11/02/2011  *RADIOLOGY REPORT*  Clinical Data: Shortness of breath.  Controlled hypertension. History of congestive heart failure.  Former smoker.  CHEST - 2 VIEW  Comparison: 03/03/2011.  11/03/2010. CT 08/22/2010.  Findings: There is stable cardiac silhouette enlargement. Ectasia and nonaneurysmal calcification of the thoracic aorta are seen.  On previous CT a large hiatal hernia was demonstrated with the majority the stomach and a large portion of the transverse colon in the lower chest.  There appears to be of similar configuration on the current examination with bibasilar atelectasis.  No consolidation or pleural effusion  is demonstrated.  There is osteopenic appearance of the bones with changes of degenerative disc disease and degenerative spondylosis. Old healed rib trauma is present.  IMPRESSION: Stable cardiac silhouette enlargement.  On previous CT a large hiatal hernia was demonstrated with the majority the stomach and a large portion of the transverse colon in the lower chest.  There appears to be of similar configuration on the current examination.  There is an element of bibasilar atelectasis associated with the large hiatal hernia.  No consolidation or pleural effusion is seen. Chronic vascular and bony findings are stable and are detailed above.  Original Report Authenticated By: Crawford Givens, M.D.   Dg Chest Portable 1 View  11/23/2011  *RADIOLOGY REPORT*  Clinical Data: Short of breath  PORTABLE  CHEST - 1 VIEW  Comparison: 11/02/2011  Findings: Very large hiatal hernia is stable.  Moderate cardiomegaly.  Left infrahilar patchy densities have developed.  No pneumothorax.  No definite pleural effusion.  IMPRESSION: Stable large hiatal hernia  Left infrahilar patchy airspace disease.  Original Report Authenticated By: Donavan Burnet, M.D.    Scheduled Meds:    . albuterol  2.5 mg Nebulization Q6H  . beta carotene w/minerals  1 tablet Oral Daily  . carvedilol  6.25 mg Oral BID WC  . dutasteride  0.5 mg Oral Daily  . enoxaparin (LOVENOX) injection  40 mg Subcutaneous Q24H  . escitalopram  10 mg Oral Daily  . ferrous sulfate  325 mg Oral BID  . furosemide  40 mg Intravenous Once  . furosemide  40 mg Intravenous Daily  . hydrOXYzine  10 mg Oral QHS  . losartan  25 mg Oral Daily  . omega-3 acid ethyl esters  2 g Oral Q breakfast  . oxybutynin  5 mg Oral QHS  . pantoprazole  40 mg Oral Q1200  . piperacillin-tazobactam  3.375 g Intravenous Once  . polyethylene glycol  17 g Oral Daily  . sodium chloride  3 mL Intravenous Q12H  . vancomycin  1,000 mg Intravenous Once  . DISCONTD: enoxaparin (LOVENOX) injection  40 mg Subcutaneous Q24H  . DISCONTD: furosemide  40 mg Intravenous Q12H  . DISCONTD: piperacillin-tazobactam (ZOSYN)  IV  3.375 g Intravenous Q8H  . DISCONTD: vancomycin  1,000 mg Intravenous Q12H   Continuous Infusions:    ELLIS,ALLISON L. [facility] Pager [phone]  If 7PM-7AM, please contact night-coverage [email or web address] Password [email or web address] 11/24/2011, 2:51 PM   LOS: 1 day

## 2011-11-24 NOTE — H&P (Addendum)
[facility] History and Physical  [name] WUJ:[id number] DOB: March 26, 1927 DOA: 11/23/2011   PCP: Florentina Jenny, MD   Chief Complaint:  Sent from [facility]  for acute onset shortness of breath  HPI:  78 y/o male with hx of CHF ( last echo in the system from 2011 with normal LVEF; [facility] Afib not on coumadin , anemia, HTN and recently admitted for AMS with hyponatremia and thought to be due to dehydration vs SIADH and treated with IV fluids and lasix held was sent from rehab for acute onset shortness of breath since 1 day. History primarily obtained from rehab and ED notes as patient was not able to provide a good history. As per the nurse patient was complaining of shortness  of breath and was tachypneic with o2 sat of 81 on 3 L. EMS arrived and was paced on BiPAP and his sats improved to 90s. He was afebrile and the staff did not notice any change in his mental status.  On arrival to ED he was noted to be in acute CHF and given a dose of 40 mg IV lasix after foley placed in. Patient improved his sat and placed on face mask at 6 L and his sats now stable in high 90s.  Patient is quite sleepy but arousable and answers questions appropriately. He denies any chest pain, palpitations, cough, fever or chills. Denies any nausea, vomiting, abdominal pain, bowel or urinary symptoms. As per nurse at [facility] he has chronic leg swelling and redness and is usually wheelchair bound. Patient informs feeling much better after receiving lasix in the ED.  [facility] called for admission to stepdown unit given acute respiratory failure with CHF exacerbation and possibly associated HCAP.     Review of Systems:  Limited as patient is sleepy. Obtained mainly from ED and nurse at [facility] Constitutional: Denies fever, chills, diaphoresis, appetite change and fatigue.  HEENT: Denies photophobia, eye pain, redness, hearing loss, ear pain,  congestion, sore throat, rhinorrhea, sneezing, mouth sores, trouble swallowing, neck pain, neck stiffness and tinnitus.   Respiratory: shortness of breath ,denies  cough, chest tightness,  and wheezing.   Cardiovascular: Denies chest pain, palpitations and leg swelling.  Gastrointestinal: Denies nausea, vomiting, abdominal pain, diarrhea, constipation, blood in stool and abdominal distention.  Genitourinary: Denies dysuria, urgency, frequency, hematuria, flank pain and difficulty urinating.  Musculoskeletal: chronic leg swelling and erythremia. Denies myalgias, back pain, joint swelling, arthralgias and gait problem.  Skin: Denies pallor, rash and wound.  Neurological: Denies dizziness, seizures, syncope, weakness, light-headedness, numbness and headaches.  Hematological: Denies adenopathy. Easy bruising, personal or family bleeding history  Psychiatric/Behavioral: Denies suicidal ideation, mood changes, confusion, nervousness, sleep disturbance and agitation   Past Medical History  Diagnosis Date  . Cellulitis   . Arthritis   . GERD (gastroesophageal reflux disease)   . Hypokalemia   . Hypertension   . Atrial fibrillation   . CHF (congestive heart failure)   . Ankle deformity     broke right ankle and it "was set" no surgery   Past Surgical History  Procedure Date  . Knee surgery age 7    broke knee cap in [facility] - doesn't remember which knee   Social History:  reports that he has never smoked. He has never used smokeless tobacco. He reports that he drinks alcohol. He reports that he does not use illicit drugs.  No Known Allergies  No family history  on file.  Prior to Admission medications   Medication Sig Start Date End Date Taking? Authorizing Provider  Calcium Carbonate-Vitamin D (CALCIUM 500 + D PO) Take 1 tablet by mouth daily.   Yes Historical Provider, MD  carvedilol (COREG) 6.25 MG tablet Take 6.25 mg by mouth 2 (two) times daily with a meal.   Yes Historical Provider, MD   dutasteride (AVODART) 0.5 MG capsule Take 0.5 mg by mouth daily.   Yes Historical Provider, MD  escitalopram (LEXAPRO) 10 MG tablet Take 10 mg by mouth daily.   Yes Historical Provider, MD  ferrous sulfate 325 (65 FE) MG tablet Take 325 mg by mouth 2 (two) times daily.   Yes Historical Provider, MD  fish oil-omega-3 fatty acids 1000 MG capsule Take 2 g by mouth daily.   Yes Historical Provider, MD  hydrOXYzine (ATARAX/VISTARIL) 10 MG tablet Take 10 mg by mouth at bedtime.   Yes Historical Provider, MD  losartan (COZAAR) 25 MG tablet Take 25 mg by mouth daily.   Yes Historical Provider, MD  Multiple Vitamins-Minerals (DECUBI-VITE PO) Take 1 tablet by mouth daily.   Yes Historical Provider, MD  multivitamin-lutein (OCUVITE-LUTEIN) CAPS Take 1 capsule by mouth daily.   Yes Historical Provider, MD  nitroGLYCERIN (NITROSTAT) 0.4 MG SL tablet Place 0.4 mg under the tongue every 5 (five) minutes as needed. For chest pain   Yes Historical Provider, MD  oxybutynin (DITROPAN) 5 MG tablet Take 5 mg by mouth at bedtime.    Yes Historical Provider, MD  pantoprazole (PROTONIX) 40 MG tablet Take 40 mg by mouth daily.   Yes Historical Provider, MD  polyethylene glycol (MIRALAX / GLYCOLAX) packet Take 17 g by mouth daily.   Yes Historical Provider, MD  Propylene Glycol (SYSTANE BALANCE OP) Place 1 drop into both eyes 2 (two) times daily.   Yes Historical Provider, MD    Physical Exam:  Filed Vitals:   11/23/11 2212 11/23/11 2215 11/23/11 2330 11/23/11 2345  BP: 140/89 126/66 120/83 114/86  Pulse:  89 86 95  Resp: 20 20 16 16   SpO2: 99% 99% 100% 100%    Constitutional: Vital signs reviewed.  Elderly frail male lying in bed on a face mask Head: Normocephalic and atraumatic Ear: TM normal bilaterally Mouth: no erythema or exudates, MMM Eyes: PERRL, EOMI, conjunctivae normal, No scleral icterus.  Neck: Supple, Trachea midline normal ROM, positive JVD , mass, thyromegaly, or carotid bruit present.    Cardiovascular:  S1  S2 irregular, no MRG, pulses symmetric and intact bilaterally Pulmonary/Chest:  Diminished breath sounds b/l Abdominal: Soft. Non-tender, non-distended, bowel sounds are normal, no masses, organomegaly, or guarding present.  GU: no CVA tenderness Musculoskeletal: No joint deformities, chronic b/l LE erythema , no stiffness, ROM full and no nontender Ext: 2 + pitting edema,no cyanosis, pulses palpable bilaterally (DP and PT) Hematology: no cervical, inginal, or axillary adenopathy.  Neurological:, sleepy but arousable to questions and answers appropriately. Strenght is normal and symmetric bilaterally, cranial nerve II-XII are grossly intact, no focal motor deficit, sensory intact to light touch bilaterally.  Skin: Warm, dry and intact. No rash, cyanosis, or clubbing.  Psychiatric: Normal mood and affect. speech and behavior is normal. Judgment and thought content normal. Cognition and memory are normal.   Labs on Admission:  Basic Metabolic Panel:  Lab 11/23/11 1610  NA 121*  K 4.5  CL 86*  CO2 21  GLUCOSE 155*  BUN 11  CREATININE 0.56  CALCIUM 8.5  MG --  PHOS --  Liver Function Tests: No results found for this basename: AST:5,ALT:5,ALKPHOS:5,BILITOT:5,PROT:5,ALBUMIN:5 in the last 168 hours No results found for this basename: LIPASE:5,AMYLASE:5 in the last 168 hours No results found for this basename: AMMONIA:5 in the last 168 hours CBC:  Lab 11/23/11 2204  WBC 7.0  NEUTROABS 5.8  HGB 10.3*  HCT 30.9*  MCV 86.6  PLT 316   Cardiac Enzymes: No results found for this basename: CKTOTAL:5,CKMB:5,CKMBINDEX:5,TROPONINI:5 in the last 168 hours BNP: No components found with this basename: POCBNP:5 CBG: No results found for this basename: GLUCAP:5 in the last 168 hours  Radiological Exams on Admission: Dg Chest Portable 1 View  11/23/2011  *RADIOLOGY REPORT*  Clinical Data: Short of breath  PORTABLE CHEST - 1 VIEW  Comparison: 11/02/2011  Findings: Very  large hiatal hernia is stable.  Moderate cardiomegaly.  Left infrahilar patchy densities have developed.  No pneumothorax.  No definite pleural effusion.  IMPRESSION: Stable large hiatal hernia  Left infrahilar patchy airspace disease.  Original Report Authenticated By: Donavan Burnet, M.D.    EKG:   Assessment/Plan   Principle problem:  *Acute exacerbation of CHF (congestive heart failure) Patient has hx of underlying CHF with last Echo in the system from 2011 with normal EF  he was recently admitted for hyponatremia with AMS and thought to be due to dehydration with possibly SIADH and his lasix held since then.  he now presents with symptoms  Of CHF exacerbation with possibly superimposed pneumonia on CXR.  -given his acute respiratory failure will admit to stepdown overnight. -diuresis with IV lasix 40 mg BID. Monitor strict I/O and daily weight.  -has chronic leg edema and chronically elevated (pro BNP  this admission of 4700 ) -check 2D echo -TSH last admission was wnl -follows with Dr Mayford Knife from [facility] cardiology. i will call [facility] in am for consult  Active Problems:  Hyponatremia Patient has chronic hyponatremia and on recent admission was thought to be due to dehydration for which lasix was held and  IV fluid given . He ahd presented with AMS and Na of 115 which had improved to 126 on discharge. He now has Na lecvel of 121 and mental status is stable. Check repeat UA, urine sodium, serum and urine osm.  will re consult renal team in am. -monitor Na level closely and for change in mental status.     Atrial fibrillation Rate controlled. Not on coumadin  continue BB   HCAP (healthcare-associated pneumonia) Left sided infiltrate on CXR.   will treat empirically with IV vanco and zosyn. Scheduled nebs . Continue on facemask for now and titrate 02 requirement  follow blood cx Monitor wbc and temp curve   GERD (gastroesophageal reflux disease) Resume home  meds  Anemia : chronic and stable  Code Status: DNR   DVT prophylaxis: sq lovenox  Diet: cardiac    Disposition Plan: Admit to stepdown for closer monitoring  Eddie North [facility] Pager [phone]  If 7PM-7AM, please contact night-coverage [email or web address] Password [facility] 11/24/2011, 12:57 AM   Total time spent on admission: 70 minutes

## 2011-11-25 DIAGNOSIS — I5031 Acute diastolic (congestive) heart failure: Secondary | ICD-10-CM | POA: Diagnosis present

## 2011-11-25 LAB — BASIC METABOLIC PANEL
CO2: 31 mEq/L (ref 19–32)
Calcium: 8.1 mg/dL — ABNORMAL LOW (ref 8.4–10.5)
GFR calc non Af Amer: 87 mL/min — ABNORMAL LOW (ref >90)
Glucose, Bld: 92 mg/dL (ref 70–99)
Potassium: 3.4 mEq/L — ABNORMAL LOW (ref 3.5–5.1)
Sodium: 125 mEq/L — ABNORMAL LOW (ref 135–145)

## 2011-11-25 LAB — LEGIONELLA ANTIGEN, URINE: Legionella Antigen, Urine: NEGATIVE

## 2011-11-25 LAB — GLUCOSE, CAPILLARY: Glucose-Capillary: 69 mg/dL — ABNORMAL LOW (ref 70–99)

## 2011-11-25 MED ORDER — ENSURE COMPLETE PO LIQD: 237.0000 mL | Freq: Two times a day (BID) | ORAL | Status: DC | PRN

## 2011-11-25 MED ORDER — FUROSEMIDE 40 MG PO TABS
40.0000 mg | ORAL_TABLET | Freq: Every day | ORAL | Status: DC
  Administered 2011-11-25 – 2011-11-28 (×4): 40 mg via ORAL
  Filled 2011-11-25 (×5): qty 1

## 2011-11-25 MED ORDER — MUPIROCIN 2 % EX OINT
1.0000 "application " | TOPICAL_OINTMENT | Freq: Two times a day (BID) | CUTANEOUS | Status: DC
  Administered 2011-11-25 – 2011-11-28 (×7): 1 via NASAL
  Filled 2011-11-25: qty 22

## 2011-11-25 MED ORDER — FUROSEMIDE 40 MG PO TABS: 40.0000 mg | ORAL_TABLET | Freq: Every day | ORAL | Status: DC

## 2011-11-25 MED ORDER — CHLORHEXIDINE GLUCONATE CLOTH 2 % EX PADS
6.0000 | MEDICATED_PAD | Freq: Every day | CUTANEOUS | Status: DC
  Administered 2011-11-25 – 2011-11-28 (×4): 6 via TOPICAL

## 2011-11-25 NOTE — Progress Notes (Signed)
SUBJECTIVE:  Feeling better  OBJECTIVE:   Vitals:   Filed Vitals:   11/24/11 1744 11/24/11 2224 11/25/11 0318 11/25/11 0509  BP: 112/75 105/69  121/74  Pulse: 85 86  78  Temp: 98.8 F (37.1 C) 97.9 F (36.6 C)  98.7 F (37.1 C)  TempSrc: Oral Oral  Oral  Resp:  17  19  Height:      Weight:  70.7 kg (155 lb 13.8 oz)    SpO2: 97% 97% 99% 97%   I&O's:   Intake/Output Summary (Last 24 hours) at 11/25/11 0834 Last data filed at 11/25/11 0300  Gross per 24 hour  Intake    120 ml  Output   1275 ml  Net  -1155 ml     PHYSICAL EXAM General: Well developed, well nourished, in no acute distress  Lungs:   Clear bilaterally to auscultation and percussion. Heart:   HRRR S1 S2 Pulses are 2+ & equal. 2/6 SM at RUSB Abdomen: Bowel sounds are positive, abdomen soft and non-tender without masses  Extremities:   No clubbing, cyanosis or edema.  DP +1 Neuro: Alert and oriented X 3. Psych:  Good affect, responds appropriately   LABS: Basic Metabolic Panel:  Basename 11/25/11 0520 11/24/11 0446  NA 125* 127*  K 3.4* 3.8  CL 86* 89*  CO2 31 29  GLUCOSE 92 105*  BUN 10 10  CREATININE 0.66 0.58  CALCIUM 8.1* 8.5  MG -- --  PHOS -- --   Liver Function Tests: No results found for this basename: AST:2,ALT:2,ALKPHOS:2,BILITOT:2,PROT:2,ALBUMIN:2 in the last 72 hours No results found for this basename: LIPASE:2,AMYLASE:2 in the last 72 hours CBC:  Basename 11/24/11 0446 11/23/11 2204  WBC 6.5 7.0  NEUTROABS -- 5.8  HGB 9.3* 10.3*  HCT 27.5* 30.9*  MCV 85.4 86.6  PLT 257 316   Coag Panel:   Lab Results  Component Value Date   INR 1.18 11/01/2011   INR 1.16 10/31/2011   INR 1.12 03/05/2011    RADIOLOGY: Dg Chest 2 View  11/02/2011  *RADIOLOGY REPORT*  Clinical Data: Shortness of breath.  Controlled hypertension. History of congestive heart failure.  Former smoker.  CHEST - 2 VIEW  Comparison: 03/03/2011.  11/03/2010. CT 08/22/2010.  Findings: There is stable cardiac silhouette  enlargement. Ectasia and nonaneurysmal calcification of the thoracic aorta are seen.  On previous CT a large hiatal hernia was demonstrated with the majority the stomach and a large portion of the transverse colon in the lower chest.  There appears to be of similar configuration on the current examination with bibasilar atelectasis.  No consolidation or pleural effusion is demonstrated.  There is osteopenic appearance of the bones with changes of degenerative disc disease and degenerative spondylosis. Old healed rib trauma is present.  IMPRESSION: Stable cardiac silhouette enlargement.  On previous CT a large hiatal hernia was demonstrated with the majority the stomach and a large portion of the transverse colon in the lower chest.  There appears to be of similar configuration on the current examination.  There is an element of bibasilar atelectasis associated with the large hiatal hernia.  No consolidation or pleural effusion is seen. Chronic vascular and bony findings are stable and are detailed above.  Original Report Authenticated By: Crawford Givens, M.D.   Dg Chest Portable 1 View  11/23/2011  *RADIOLOGY REPORT*  Clinical Data: Short of breath  PORTABLE CHEST - 1 VIEW  Comparison: 11/02/2011  Findings: Very large hiatal hernia is stable.  Moderate cardiomegaly.  Left infrahilar patchy densities have developed.  No pneumothorax.  No definite pleural effusion.  IMPRESSION: Stable large hiatal hernia  Left infrahilar patchy airspace disease.  Original Report Authenticated By: Donavan Burnet, M.D.      ASSESSMENT:  1.  Chronic atrial fibrillation rate controlled.  Not a coumadin candidate 2.  Acute on chronic diastolic heart failure compensated - lungs clear on exam 3.  Moderate AS 4.  Hyponatremia with history of MS changes in the setting of low sodium 5.  Anemia of chronic disease 6.  Healthcare associated pneumonia 7.  Hypokalemia  PLAN:   1.  Change to PO Lasix and follow sodium closely 2.   Continue Coreg and Losartan 3.  Replete potassium per Hostpitalist  Quintella Reichert, MD  11/25/2011  8:34 AM

## 2011-11-25 NOTE — Discharge Summary (Addendum)
Physician Discharge Summary  [name] UJW:[id number] DOB: 1926/12/12 DOA: 11/23/2011  PCP: Florentina Jenny, MD Cardiologist: Carolanne Grumbling, M.D.  Admit date: 11/23/2011 Discharge date: 11/25/2011  Recommendations for Outpatient Follow-up:  1. Followup hyponatremia. Consider repeat basic metabolic panel in one week. Lexapro has been discontinued secondary to suspected SIADH effect. 2. Recommend daily weights in regard to congestive heart failure.  Follow-up Information    Follow up with Florentina Jenny, MD in 1 week.   Contact information:   [address] [place]ure[place] Josephs. [address] [place] [address] [phone]       Follow up with Quintella Reichert, MD. Schedule an appointment as soon as possible for a visit in 1 month.   Contact information:   [address] [place] [address] [phone]         Discharge Diagnoses:  1. Acute respiratory failure secondary to heart failure 2. Acute diastolic congestive heart failure 3. Hyponatremia 4. Atrial fibrillation, not Coumadin candidate per cardiology. 5. Chronic venous stasis dermatitis bilateral lower extremities  Discharge Condition: Improved Disposition: Return to Skilled Nursing Facility  Diet recommendation: Heart Healthy  History of present illness:  78 year old man brought to the hospital for acute shortness of breath requiring CPAP prior to arrival. Recently hospitalized for altered mental status and hyponatremia thought to be secondary to dehydration versus SIADH. Lasix was held and was discharged back to nursing Center. He was admitted for acute respiratory failure, acute congestive heart failure exacerbation.   Hospital Course:  [name] was admitted and seen in consultation with cardiology for acute diastolic heart failure. He rapidly improved with aggressive diuresis and has been transitioned to oral Lasix. His respiratory failure has resolved with treatment of his acute heart failure.  Initially consideration was given to the possibility of healthcare associated pneumonia however upon further investigation this was doubted and antibiotics were discontinued. Hyponatremia improved with diuresis and discontinuation of Lexapro. Swelling has improved with diuresis. Atrial fibrillation remained stable during this hospitalization. 1. Acute respiratory failure: Resolved. Secondary to acute diastolic heart failure.  2. Acute diastolic congestive heart failure: Rapidly improved with aggressive diuresis. Presumed diastolic and valvular in nature (moderate mitral valve regurgitation).   3. Possible healthcare associated pneumonia: This was considered on admission. However infectious process was doubted and antibiotics discontinued.  4. Hyponatremia: Acute on chronic. Probably secondary primarily to volume overload/heart failure but likely complicated by Lexapro. Continue daily Lasix. Lexapro discontinued. 5. Atrial fibrillation: Not a Coumadin candidate per cardiology. Continue Coreg.  6. Chronic venous stasis dermatitis bilateral lower extremities:  Appears stable.  Consultants:  Cardiology  Procedures:  2-D echocardiogram: Left ventricular ejection fraction 55-60%. Grade 1 diastolic dysfunction. Moderate aortic stenosis. Moderate mitral valve regurgitation.   Discharge Instructions   Medication List  As of 11/28/2011 11:40 AM   STOP taking these medications         escitalopram 10 MG tablet         TAKE these medications         CALCIUM 500 + D PO   Take 1 tablet by mouth daily.      carvedilol 6.25 MG tablet   Commonly known as: COREG   Take 6.25 mg by mouth 2 (two) times daily with a meal.      dutasteride 0.5 MG capsule   Commonly known as: AVODART   Take 0.5 mg by mouth daily.      ferrous sulfate 325 (65 FE) MG tablet   Take  325 mg by mouth 2 (two) times daily.      fish oil-omega-3 fatty acids 1000 MG capsule   Take 2 g by mouth daily.      furosemide 40 MG  tablet   Commonly known as: LASIX   Take 1 tablet (40 mg total) by mouth daily.      hydrOXYzine 10 MG tablet   Commonly known as: ATARAX/VISTARIL   Take 10 mg by mouth at bedtime.      losartan 25 MG tablet   Commonly known as: COZAAR   Take 25 mg by mouth daily.      multivitamin-lutein Caps   Take 1 capsule by mouth daily.      DECUBI-VITE PO   Take 1 tablet by mouth daily.      nitroGLYCERIN 0.4 MG SL tablet   Commonly known as: NITROSTAT   Place 0.4 mg under the tongue every 5 (five) minutes as needed. For chest pain      oxybutynin 5 MG tablet   Commonly known as: DITROPAN   Take 5 mg by mouth at bedtime.      pantoprazole 40 MG tablet   Commonly known as: PROTONIX   Take 40 mg by mouth daily.      polyethylene glycol packet   Commonly known as: MIRALAX / GLYCOLAX   Take 17 g by mouth daily.      SYSTANE BALANCE OP   Place 1 drop into both eyes 2 (two) times daily.           The results of significant diagnostics from this hospitalization (including imaging, microbiology, ancillary and laboratory) are listed below for reference.    Significant Diagnostic Studies: Dg Chest Portable 1 View  11/23/2011  *RADIOLOGY REPORT*  Clinical Data: Short of breath  PORTABLE CHEST - 1 VIEW  Comparison: 11/02/2011  Findings: Very large hiatal hernia is stable.  Moderate cardiomegaly.  Left infrahilar patchy densities have developed.  No pneumothorax.  No definite pleural effusion.  IMPRESSION: Stable large hiatal hernia  Left infrahilar patchy airspace disease.  Original Report Authenticated By: Donavan Burnet, M.D.   Microbiology: Recent Results (from the past 240 hour(s))  MRSA PCR SCREENING     Status: Abnormal   Collection Time   11/24/11  3:15 AM      Component Value Range Status Comment   MRSA by PCR POSITIVE (*) NEGATIVE Final   CULTURE, BLOOD (ROUTINE X 2)     Status: Normal (Preliminary result)   Collection Time   11/24/11  4:20 AM      Component Value Range  Status Comment   Specimen Description BLOOD LEFT ARM   Final    Special Requests BOTTLES DRAWN AEROBIC ONLY 10CC   Final    Culture  Setup Time 11/24/2011 09:17   Final    Culture     Final    Value:        BLOOD CULTURE RECEIVED NO GROWTH TO DATE CULTURE WILL BE HELD FOR 5 DAYS BEFORE ISSUING A FINAL NEGATIVE REPORT   Report Status PENDING   Incomplete   CULTURE, BLOOD (ROUTINE X 2)     Status: Normal (Preliminary result)   Collection Time   11/24/11  4:30 AM      Component Value Range Status Comment   Specimen Description BLOOD RIGHT HAND   Final    Special Requests BOTTLES DRAWN AEROBIC ONLY 10CC   Final    Culture  Setup Time 11/24/2011 09:17  Final    Culture     Final    Value:        BLOOD CULTURE RECEIVED NO GROWTH TO DATE CULTURE WILL BE HELD FOR 5 DAYS BEFORE ISSUING A FINAL NEGATIVE REPORT   Report Status PENDING   Incomplete     Labs: Basic Metabolic Panel:  Lab 11/25/11 9604 11/24/11 0446 11/23/11 2204  NA 125* 127* 121*  K 3.4* 3.8 4.5  CL 86* 89* 86*  CO2 31 29 21   GLUCOSE 92 105* 155*  BUN 10 10 11   CREATININE 0.66 0.58 0.56  CALCIUM 8.1* 8.5 8.5  MG -- -- --  PHOS -- -- --   CBC:  Lab 11/24/11 0446 11/23/11 2204  WBC 6.5 7.0  NEUTROABS -- 5.8  HGB 9.3* 10.3*  HCT 27.5* 30.9*  MCV 85.4 86.6  PLT 257 316   BNP (last 3 results)  Basename 11/23/11 2141 10/31/11 1450 02/28/11 0510  PROBNP 4727.0* 5129.0* 2946.0*   CBG:  Lab 11/25/11 0730  GLUCAP 69*    Principal Problem:  *Acute respiratory failure with hypoxia Active Problems:  Acute diastolic CHF (congestive heart failure)  Hyponatremia  Atrial fibrillation, chronic  Anemia  Venous stasis dermatitis of bilateral lower extremity   Time coordinating discharge: 35 minutes  Signed:  Brendia Sacks, MD [facility] 11/25/2011, 3:42 PM

## 2011-11-25 NOTE — Evaluation (Signed)
Physical Therapy Evaluation Patient Details Name: [name] MRN: [id number] DOB: 1927/04/19 Today's Date: 11/25/2011 Time: 2130-8657 PT Time Calculation (min): 33 min  PT Assessment / Plan / Recommendation Clinical Impression  Pt. admitted with afib, acute on chronic diastolic heart failure, hyponatremia , HCAP, and chronic anemia.  He presents with decreased activity tolerance compared to his baseline at [facility] (by his report).  He will benefit from acute PT to address mobility issues while inhouse.      PT Assessment  Patient needs continued PT services    Follow Up Recommendations  Skilled nursing facility    Barriers to Discharge None      Equipment Recommendations  Defer to next venue    Recommendations for Other Services     Frequency Min 3X/week    Precautions / Restrictions Precautions Precautions: Fall Required Braces or Orthoses: Other Brace/Splint Other Brace/Splint: velcro shoes for possible charcot deformity, not currently here Restrictions Weight Bearing Restrictions: No   Pertinent Vitals/Pain O2 sats mid 90's on 4L O2 at rest, decreased to 88% on 4L O2 with short walk of 15', returned to 95% after brief rest.  HR range 66-90.  No report of pain.      Mobility  Bed Mobility Bed Mobility: Supine to Sit;Sit to Supine Supine to Sit: 5: Supervision;HOB elevated Sit to Supine: 4: Min guard;HOB elevated;With rail Details for Bed Mobility Assistance: increased time Transfers Transfers: Sit to Stand;Stand to Sit Sit to Stand: 4: Min assist Stand to Sit: 4: Min assist Details for Transfer Assistance: assist for steadying and for rising/sitting.  VC's for hand placement. Ambulation/Gait Ambulation/Gait Assistance: 4: Min assist Ambulation Distance (Feet): 15 Feet Assistive device: Rolling walker Ambulation/Gait Assistance Details: Pt. with severe foot deformities and has custom shoes buth they are not here at hospital.  Nevertheless, he was able  to ambulate to door and back with supplemental O2, and steadying assist especially initially..  Fatigues quickly. Gait Pattern: Step-through pattern;Decreased stride length;Trunk flexed;Narrow base of support Gait velocity: decreased Stairs: No Wheelchair Mobility Wheelchair Mobility: No    Exercises     PT Diagnosis: Difficulty walking;Abnormality of gait;Generalized weakness;Acute pain  PT Problem List: Decreased strength;Decreased activity tolerance;Decreased mobility;Decreased knowledge of use of DME;Cardiopulmonary status limiting activity PT Treatment Interventions: DME instruction;Gait training;Functional mobility training;Therapeutic activities;Therapeutic exercise;Patient/family education   PT Goals Acute Rehab PT Goals PT Goal Formulation: With patient Time For Goal Achievement: 12/09/11 Potential to Achieve Goals: Fair Pt will go Supine/Side to Sit: with supervision;with HOB 0 degrees PT Goal: Supine/Side to Sit - Progress: Goal set today Pt will go Sit to Supine/Side: with supervision;with HOB 0 degrees PT Goal: Sit to Supine/Side - Progress: Goal set today Pt will go Sit to Stand: with supervision PT Goal: Sit to Stand - Progress: Goal set today Pt will go Stand to Sit: with supervision PT Goal: Stand to Sit - Progress: Goal set today Pt will Ambulate: 16 - 50 feet;with supervision;with least restrictive assistive device PT Goal: Ambulate - Progress: Goal set today  Visit Information  Last PT Received On: 11/25/11 Assistance Needed: +2 (helpful for portable O2)    Subjective Data  Subjective: Generally feeling better Patient Stated Goal: return to [facility]   Prior Functioning  Home Living Available Help at Discharge: Skilled Nursing Facility Home Adaptive Equipment: Wheelchair - manual Prior Function Level of Independence: Needs assistance;Independent with assistive device(s) (independent with w/c transfers, walks with assist 900 ft.) Needs Assistance:  Bathing;Dressing;Grooming Able to Take Stairs?:  No Driving: No Vocation: Retired Musician: No difficulties Dominant Hand: Right    Cognition  Overall Cognitive Status: Appears within functional limits for tasks assessed/performed Arousal/Alertness: Awake/alert Orientation Level: Appears intact for tasks assessed Behavior During Session: [facility] for tasks performed    Extremity/Trunk Assessment Right Upper Extremity Assessment RUE ROM/Strength/Tone: WFL for tasks assessed RUE Sensation: WFL - Light Touch RUE Coordination: WFL - gross motor Left Upper Extremity Assessment LUE ROM/Strength/Tone: WFL for tasks assessed LUE Sensation: WFL - Light Touch LUE Coordination: WFL - gross motor Right Lower Extremity Assessment RLE ROM/Strength/Tone: WFL for tasks assessed RLE Sensation: WFL - Light Touch RLE Coordination: WFL - gross motor Left Lower Extremity Assessment LLE ROM/Strength/Tone: WFL for tasks assessed LLE Sensation: WFL - Light Touch LLE Coordination: WFL - gross motor Trunk Assessment Trunk Assessment: Kyphotic   Balance    End of Session PT - End of Session Equipment Utilized During Treatment: Gait belt Activity Tolerance: Patient limited by fatigue Patient left: in bed;with family/visitor present;with call bell/phone within reach;Other (comment) (agrees to get to chair later with assist) Nurse Communication: Mobility status;Other (comment)  GP     Ferman Hamming 11/25/2011, 11:19 AM Acute Rehabilitation Services [phone] (pager)

## 2011-11-25 NOTE — Clinical Social Work Note (Signed)
CSW asked by attending to check with facility regarding discharging patient on Saturday. CSW talked with [name] in admissions at [facility] and patient can d/c on Saturday, however discharge summary needed today. This information communicated with MD and d/c summary completed and faxed to facility.  Genelle Bal, MSW, LCSW [phone]

## 2011-11-25 NOTE — Progress Notes (Signed)
[facility] PROGRESS NOTE  [name] ZOX:[id number] DOB: [birth year] DOA: 11/23/2011 PCP: Florentina Jenny, MD Cardiologist: Carolanne Grumbling, M.D.  Assessment/Plan: 1. Acute respiratory failure: Resolved. Secondary to acute diastolic heart failure. 2. Acute diastolic congestive heart failure: Rapidly improved with aggressive diuresis. Presumed diastolic and valvular in nature (moderate mitral valve regurgitation). Continue her. 3. Possible healthcare associated pneumonia: This was considered on admission. However infectious process was doubted doubted and antibiotics discontinued. 4. Hyponatremia: Acute on chronic. Probably secondary primarily to volume overload/heart failure but likely complicated by Lexapro. Continue diuresis. If problem persists would suggest discontinuing Lexapro. 5. Atrial fibrillation: Not a Coumadin candidate per cardiology. Continue Coreg. 6. Chronic venous stasis dermatitis bilateral lower extremities:  Appears stable.  Code Status: DO NOT RESUSCITATE Family Communication: None at bedside Disposition Plan: Possible return to skilled nursing facility 7/13.  Brendia Sacks, MD  [facility] Pager [phone]. If 8PM-8AM, please contact night-coverage at [email or web address], password [facility] 11/25/2011, 9:43 AM  LOS: 2 days   Brief narrative: 78 year old man brought to the hospital for acute shortness of breath requiring CPAP prior to arrival. Recently hospitalized for altered mental status and hyponatremia thought to be secondary to dehydration versus SIADH. Lasix was held and was discharged back to nursing Center. He was admitted for acute respiratory failure, acute congestive heart failure exacerbation probably related to withholding of Lasix. History with IV diuresis and empiric antibiotics.  Consultants:  Cardiology  Procedures:  2-D echocardiogram: Left ventricular ejection fraction 55-60%. Grade 1 diastolic dysfunction. Moderate aortic stenosis. Moderate  mitral valve regurgitation.   HPI/Subjective: Feels much better. Breathing better.  Objective: Filed Vitals:   11/24/11 1744 11/24/11 2224 11/25/11 0318 11/25/11 0509  BP: 112/75 105/69  121/74  Pulse: 85 86  78  Temp: 98.8 F (37.1 C) 97.9 F (36.6 C)  98.7 F (37.1 C)  TempSrc: Oral Oral  Oral  Resp:  17  19  Height:      Weight:  70.7 kg (155 lb 13.8 oz)    SpO2: 97% 97% 99% 97%    Intake/Output Summary (Last 24 hours) at 11/25/11 0943 Last data filed at 11/25/11 0300  Gross per 24 hour  Intake    120 ml  Output   1275 ml  Net  -1155 ml    Exam:   General:  Appears calm and comfortable.  Cardiovascular: irregular rhythm, normal rate; no m/r/g; no lower extremity edema.  Respiratory: CTA bilaterally. No wheezes, rales, rhonchi. Normal respiratory effort.  Skin: Bilateral chronic venous stasis changes.  Data Reviewed: Basic Metabolic Panel:  Lab 11/25/11 8295 11/24/11 0446 11/23/11 2204  NA 125* 127* 121*  K 3.4* 3.8 4.5  CL 86* 89* 86*  CO2 31 29 21   GLUCOSE 92 105* 155*  BUN 10 10 11   CREATININE 0.66 0.58 0.56  CALCIUM 8.1* 8.5 8.5  MG -- -- --  PHOS -- -- --   CBC:  Lab 11/24/11 0446 11/23/11 2204  WBC 6.5 7.0  NEUTROABS -- 5.8  HGB 9.3* 10.3*  HCT 27.5* 30.9*  MCV 85.4 86.6  PLT 257 316   BNP (last 3 results)  Basename 11/23/11 2141 10/31/11 1450 02/28/11 0510  PROBNP 4727.0* 5129.0* 2946.0*   CBG:  Lab 11/25/11 0730  GLUCAP 69*    Recent Results (from the past 240 hour(s))  MRSA PCR SCREENING     Status: Abnormal   Collection Time   11/24/11  3:15 AM      Component Value Range Status Comment  MRSA by PCR POSITIVE (*) NEGATIVE Final   CULTURE, BLOOD (ROUTINE X 2)     Status: Normal (Preliminary result)   Collection Time   11/24/11  4:20 AM      Component Value Range Status Comment   Specimen Description BLOOD LEFT ARM   Final    Special Requests BOTTLES DRAWN AEROBIC ONLY 10CC   Final    Culture  Setup Time 11/24/2011 09:17    Final    Culture     Final    Value:        BLOOD CULTURE RECEIVED NO GROWTH TO DATE CULTURE WILL BE HELD FOR 5 DAYS BEFORE ISSUING A FINAL NEGATIVE REPORT   Report Status PENDING   Incomplete   CULTURE, BLOOD (ROUTINE X 2)     Status: Normal (Preliminary result)   Collection Time   11/24/11  4:30 AM      Component Value Range Status Comment   Specimen Description BLOOD RIGHT HAND   Final    Special Requests BOTTLES DRAWN AEROBIC ONLY 10CC   Final    Culture  Setup Time 11/24/2011 09:17   Final    Culture     Final    Value:        BLOOD CULTURE RECEIVED NO GROWTH TO DATE CULTURE WILL BE HELD FOR 5 DAYS BEFORE ISSUING A FINAL NEGATIVE REPORT   Report Status PENDING   Incomplete     Studies: Dg Chest Portable 1 View  11/23/2011  *RADIOLOGY REPORT*  Clinical Data: Short of breath  PORTABLE CHEST - 1 VIEW  Comparison: 11/02/2011  Findings: Very large hiatal hernia is stable.  Moderate cardiomegaly.  Left infrahilar patchy densities have developed.  No pneumothorax.  No definite pleural effusion.  IMPRESSION: Stable large hiatal hernia  Left infrahilar patchy airspace disease.  Original Report Authenticated By: Donavan Burnet, M.D.   Scheduled Meds:   . albuterol  2.5 mg Nebulization Q6H  . beta carotene w/minerals  1 tablet Oral Daily  . carvedilol  6.25 mg Oral BID WC  . dutasteride  0.5 mg Oral Daily  . enoxaparin (LOVENOX) injection  40 mg Subcutaneous Q24H  . escitalopram  10 mg Oral Daily  . ferrous sulfate  325 mg Oral BID  . furosemide  40 mg Oral Daily  . hydrOXYzine  10 mg Oral QHS  . losartan  25 mg Oral Daily  . omega-3 acid ethyl esters  2 g Oral Q breakfast  . oxybutynin  5 mg Oral QHS  . pantoprazole  40 mg Oral Q1200  . polyethylene glycol  17 g Oral Daily  . sodium chloride  3 mL Intravenous Q12H  . DISCONTD: furosemide  40 mg Intravenous Daily   Continuous Infusions:   Principal Problem:  *Acute respiratory failure with hypoxia Active Problems:  Acute diastolic  CHF (congestive heart failure)  Hyponatremia  Atrial fibrillation, chronic  Anemia  Venous stasis dermatitis of bilateral lower extremity

## 2011-11-25 NOTE — Progress Notes (Signed)
INITIAL ADULT NUTRITION ASSESSMENT Date: 11/25/2011   Time: 12:11 PM Reason for Assessment:   ASSESSMENT: Male 78 y.o.  Dx: Acute exacerbation of CHF (congestive heart failure)  Hx:  Past Medical History  Diagnosis Date  . Cellulitis   . GERD (gastroesophageal reflux disease)   . Hypokalemia   . Hypertension   . CHF (congestive heart failure)   . Ankle deformity     broke right ankle and it "was set" no surgery  . Shortness of breath   . Atrial fibrillation   . Pneumonia   . History of bronchitis     "when I was young"  . H/O: whooping cough   . Diabetes mellitus 11/24/11    "I'm at the very beginning; not treated yet"  . Arthritis     "hands"  . Melanoma of back   . Anxiety    Past Surgical History  Procedure Date  . Knee surgery age 37    1943broke knee cap in [facility]va - doesn't remember which knee  . Cataract extraction w/ intraocular lens  implant, bilateral   . Melanoma excision     on my back   Related Meds:  Scheduled Meds:   . albuterol  2.5 mg Nebulization Q6H  . beta carotene w/minerals  1 tablet Oral Daily  . carvedilol  6.25 mg Oral BID WC  . Chlorhexidine Gluconate Cloth  6 each Topical Q0600  . dutasteride  0.5 mg Oral Daily  . enoxaparin (LOVENOX) injection  40 mg Subcutaneous Q24H  . escitalopram  10 mg Oral Daily  . ferrous sulfate  325 mg Oral BID  . furosemide  40 mg Oral Daily  . hydrOXYzine  10 mg Oral QHS  . losartan  25 mg Oral Daily  . mupirocin ointment  1 application Nasal BID  . omega-3 acid ethyl esters  2 g Oral Q breakfast  . oxybutynin  5 mg Oral QHS  . pantoprazole  40 mg Oral Q1200  . polyethylene glycol  17 g Oral Daily  . sodium chloride  3 mL Intravenous Q12H  . DISCONTD: furosemide  40 mg Intravenous Daily   Continuous Infusions:  PRN Meds:.sodium chloride, albuterol, nitroGLYCERIN, sodium chloride   Ht: 5\' 10"  (177.8 cm)  Wt: 155 lb 13.8 oz (70.7 kg)  Ideal Wt: 75.4 kg % Ideal Wt: 93.6%  Usual Wt: 72.7 kg %  Usual Wt: 97%  Body mass index is 22.36 kg/(m^2).  Food/Nutrition Related Hx: eating per his usual PTA, suspected some wt loss  Labs:  CMP     Component Value Date/Time   NA 125* 11/25/2011 0520   K 3.4* 11/25/2011 0520   CL 86* 11/25/2011 0520   CO2 31 11/25/2011 0520   GLUCOSE 92 11/25/2011 0520   BUN 10 11/25/2011 0520   CREATININE 0.66 11/25/2011 0520   CALCIUM 8.1* 11/25/2011 0520   PROT 5.8* 11/01/2011 0505   ALBUMIN 2.8* 11/01/2011 0505   AST 18 11/01/2011 0505   ALT 21 11/01/2011 0505   ALKPHOS 71 11/01/2011 0505   BILITOT 0.5 11/01/2011 0505   GFRNONAA 87* 11/25/2011 0520   GFRAA >90 11/25/2011 0520   CBC    Component Value Date/Time   WBC 6.5 11/24/2011 0446   RBC 3.22* 11/24/2011 0446   HGB 9.3* 11/24/2011 0446   HCT 27.5* 11/24/2011 0446   PLT 257 11/24/2011 0446   MCV 85.4 11/24/2011 0446   MCH 28.9 11/24/2011 0446   MCHC 33.8 11/24/2011 0446   RDW 13.2  11/24/2011 0446   LYMPHSABS 0.8 11/23/2011 2204   MONOABS 0.3 11/23/2011 2204   EOSABS 0.2 11/23/2011 2204   BASOSABS 0.0 11/23/2011 2204   Intake: 30% x1 Output:   Intake/Output Summary (Last 24 hours) at 11/25/11 1233 Last data filed at 11/25/11 0900  Gross per 24 hour  Intake    240 ml  Output    600 ml  Net   -360 ml   1 BM today  Diet Order: Cardiac, Vegetarian  Supplements/Tube Feeding: none at this time  IVF:    Estimated Nutritional Needs:   Kcal: 1620-1760 Protein: 70-85g Fluid: ~2100  Pt admitted with hyponatremia, AMS.  AMS has improved.  Pt known to clinical nutrition staff from previous admission.  Previously addressed concerns include:  Low sodium education, decreased appetite, and swallowing difficulty. RD discussed wt loss and appetite with pt who is agreeable to supplementation.  Discussed preference of wt maintenance. Pt has lost 3.7% usual body wt in 1 month.  RD addressed swallowing function.  Pt reports this is currently without change and he is managing appropriately.   Pt's degree of  lethargy at this visit is greater than in the past.  Pt typically sits on the side of bed and is easy to engage in conversation.  At this visit, pt lying in bed, ends conversation quickly, and generally seems more tired.  Will continue to monitor for additional changes in status.  NUTRITION DIAGNOSIS: Unintended wt loss  RELATED TO: poor appetitie  AS EVIDENCE BY: pt report, wt loss of 3.7% usual wt in 1 month.  MONITORING/EVALUATION(Goals): 1.  Food/Beverage; improvement in intake to </=50% of meals.  Pt consuming at least 1 Ensure daily. 2.  Wt/wt change; deter further loss  EDUCATION NEEDS: -Education needs addressed with pt  INTERVENTION: 1.  Supplements; Ensure once daily and PRN 2.  General healthful diet; encouraged intake.  Discussed intake and ways to improve.  Dietitian #: [phone]  DOCUMENTATION CODES Per approved criteria  -Not Applicable    Loyce Dys [facility] 11/25/2011, 12:11 PM

## 2011-11-25 NOTE — Clinical Documentation Improvement (Signed)
CHANGE MENTAL STATUS DOCUMENTATION CLARIFICATION   THIS DOCUMENT IS NOT A PERMANENT PART OF THE MEDICAL RECORD  TO RESPOND TO THE THIS QUERY, FOLLOW THE INSTRUCTIONS BELOW:  1. If needed, update documentation for the patient's encounter via the notes activity.  2. Access this query again and click edit on the In [employer].  3. After updating, or not, click F2 to complete all highlighted (required) fields concerning your review. Select "additional documentation in the medical record" OR "no additional documentation provided".  4. Click Sign note button.  5. The deficiency will fall out of your In Basket *Please let us know if you are not able to complete this workflow by phone or e-mail (listed below).         11/25/11  Dear Dr. Irene Limbo Marton Redwood  In an effort to better capture your patient's severity of illness, reflect appropriate length of stay and utilization of resources, a review of the patient medical record has revealed the following indicators.    Based on your clinical judgment, please clarify and document in a progress note and/or discharge summary the clinical condition associated with the following supporting information:  In responding to this query please exercise your independent judgment.  The fact that a query is asked, does not imply that any particular answer is desired or expected.  Possible Clinical Conditions? P _______Encephalopathy (describe type if known)                       Anoxic                       Septic                       Alcoholic                        Hepatic                       Hypertensive                       Metabolic                       Toxic  _______Acute delirium _______Other Condition _______Cannot Clinically Determine   Supporting Information: Risk Factors: (As per notes) " Acute respiratory failure, CHF, HCAP, Hyponatremia"  Signs & Symptoms:(As per notes) " Hyponatremia with history of MS changes in the setting of  low sodium "   Diagnostics: Lab: 11-23-11 Sodium 135 - 145 mEq/L  121 (L)   Treatment: (As per notes)  "IV fluid given"  "Check repeat UA, urine sodium, serum and urine osm.  will re consult renal team in am. -monitor Na level closely and for change in mental status."     Reviewed: NOT ANSWERED BY DR. Butler Denmark OR DR. GOODRICH  Thank You,  Joanette Gula Delk RN, BSN,  Clinical Documentation Specialist: [phone] Pager Health Information Management [place]

## 2011-11-26 LAB — BASIC METABOLIC PANEL
Calcium: 8.4 mg/dL (ref 8.4–10.5)
Creatinine, Ser: 0.6 mg/dL (ref 0.50–1.35)
GFR calc Af Amer: >90 mL/min (ref >90)
GFR calc non Af Amer: >90 mL/min (ref >90)
Sodium: 124 mEq/L — ABNORMAL LOW (ref 135–145)

## 2011-11-26 LAB — PRO B NATRIURETIC PEPTIDE: Pro B Natriuretic peptide (BNP): 3384 pg/mL — ABNORMAL HIGH (ref 0–450)

## 2011-11-26 MED ORDER — ALBUTEROL SULFATE (5 MG/ML) 0.5% IN NEBU
2.5000 mg | INHALATION_SOLUTION | Freq: Two times a day (BID) | RESPIRATORY_TRACT | Status: DC
  Administered 2011-11-26 – 2011-11-28 (×5): 2.5 mg via RESPIRATORY_TRACT
  Filled 2011-11-26 (×5): qty 0.5

## 2011-11-26 NOTE — Progress Notes (Signed)
Subjective:  No c/o SOB or chest pain.  Objective:  Vital Signs in the last 24 hours: BP 119/85  Pulse 80  Temp 97.5 F (36.4 C) (Oral)  Resp 18  Ht 5\' 10"  (1.778 m)  Wt 68.6 kg (151 lb 3.8 oz)  BMI 21.70 kg/m2  SpO2 99%  Physical Exam: Bearded Wm in NAD Lungs:  Clear  Cardiac:  Regular rhythm, normal S1 and S2, no S3 2/6 systolic murmur Abdomen:  Soft, nontender, no masses Extremities:  No edema present  Intake/Output from previous day: 07/12 0701 - 07/13 0700 In: 360 [P.O.:360] Out: 1500 [Urine:1500] Weight Filed Weights   11/24/11 0229 11/24/11 2224 11/25/11 2040  Weight: 71.2 kg (156 lb 15.5 oz) 70.7 kg (155 lb 13.8 oz) 68.6 kg (151 lb 3.8 oz)   Lab Results: Basic Metabolic Panel:  Basename 11/26/11 0730 11/25/11 0520  NA 124* 125*  K 3.5 3.4*  CL 86* 86*  CO2 29 31  GLUCOSE 89 92  BUN 7 10  CREATININE 0.60 0.66   CBC:  Basename 11/24/11 0446 11/23/11 2204  WBC 6.5 7.0  NEUTROABS -- 5.8  HGB 9.3* 10.3*  HCT 27.5* 30.9*  MCV 85.4 86.6  PLT 257 316   BNP    Component Value Date/Time   PROBNP 3384.0* 11/26/2011 0730   Telemetry: Atrial fib with controlled response  Assessment/Plan:  1. Acute diastolic CHF improving 2. Moderate AS 3. Hyponatremia 4. Pneumonia  Rec:  Clinically stable from heart viewpoint.  Replete K.  Continue to diurese and watch sodium.  Darden Palmer  MD [facility]  11/26/2011, 9:32 AM

## 2011-11-26 NOTE — Progress Notes (Signed)
[facility] PROGRESS NOTE  [name] JXB:[id number] DOB: 12-08-1926 DOA: 11/23/2011 PCP: Florentina Jenny, MD Cardiologist: Carolanne Grumbling, M.D.  Assessment/Plan: 1. Hyponatremia: Acute on chronic. Slightly worse today but appears asymptomatic. Does have a long-standing issue dating to 2008. Lexapro is likely the primary etiology at this point, complicated by volume overload/heart failure. Discontinue Lexapro. Continue diuresis with oral Lasix. Fluid restriction. 2. Acute respiratory failure: Resolved. Secondary to acute diastolic heart failure. 3. Acute diastolic congestive heart failure: Rapidly resolved and with aggressive diuresis. Presumed diastolic and valvular in nature (moderate mitral valve regurgitation). Continue Lasix. 4. Possible healthcare associated pneumonia: This was considered on admission. However infectious process was doubted doubted and antibiotics discontinued. 5. Atrial fibrillation: Not a Coumadin candidate per cardiology. Continue Coreg. 6. Chronic venous stasis dermatitis bilateral lower extremities:  Appears stable.  Code Status: DO NOT RESUSCITATE Family Communication: None at bedside Disposition Plan: Possible return to skilled nursing facility when sodium is stable.Brendia Sacks, MD  [facility] Pager [phone]. If 8PM-8AM, please contact night-coverage at [email or web address], password [facility] 11/26/2011, 12:14 PM  LOS: 3 days   Brief narrative: 78 year old man brought to the hospital for acute shortness of breath requiring CPAP prior to arrival. Recently hospitalized for altered mental status and hyponatremia thought to be secondary to dehydration versus SIADH. Lasix was held and was discharged back to nursing Center. He was admitted for acute respiratory failure, acute congestive heart failure exacerbation probably related to withholding of Lasix. History with IV diuresis and empiric antibiotics.  Consultants:  Cardiology  Procedures:  2-D  echocardiogram: Left ventricular ejection fraction 55-60%. Grade 1 diastolic dysfunction. Moderate aortic stenosis. Moderate mitral valve regurgitation.   HPI/Subjective: Feels fine. Slept well. No complaints. Breathing okay  Objective: Filed Vitals:   11/26/11 0149 11/26/11 0619 11/26/11 0908 11/26/11 0927  BP:  132/95  119/85  Pulse:  85  80  Temp:  97.8 F (36.6 C)  97.5 F (36.4 C)  TempSrc:  Oral  Oral  Resp:  18  18  Height:      Weight:      SpO2: 95% 100% 100% 99%    Intake/Output Summary (Last 24 hours) at 11/26/11 1214 Last data filed at 11/26/11 0900  Gross per 24 hour  Intake    360 ml  Output   1300 ml  Net   -940 ml    Exam:   General:  Appears calm and comfortable.  Cardiovascular: irregular rhythm, normal rate; no murmur rub or gallop; no lower extremity edema.  Respiratory: Clear to patient bilaterally. No wheezes, rales, rhonchi. Normal respiratory effort.  Skin: Bilateral chronic venous stasis changes.  Data Reviewed: Basic Metabolic Panel:  Lab 11/26/11 8469 11/25/11 0520 11/24/11 0446 11/23/11 2204  NA 124* 125* 127* 121*  K 3.5 3.4* 3.8 4.5  CL 86* 86* 89* 86*  CO2 29 31 29 21   GLUCOSE 89 92 105* 155*  BUN 7 10 10 11   CREATININE 0.60 0.66 0.58 0.56  CALCIUM 8.4 8.1* 8.5 8.5  MG -- -- -- --  PHOS -- -- -- --   CBC:  Lab 11/24/11 0446 11/23/11 2204  WBC 6.5 7.0  NEUTROABS -- 5.8  HGB 9.3* 10.3*  HCT 27.5* 30.9*  MCV 85.4 86.6  PLT 257 316    Basename 11/26/11 0730 11/23/11 2141 10/31/11 1450  PROBNP 3384.0* 4727.0* 5129.0*   CBG:  Lab 11/25/11 0730  GLUCAP 69*    Recent Results (from the past 240 hour(s))  MRSA  PCR SCREENING     Status: Abnormal   Collection Time   11/24/11  3:15 AM      Component Value Range Status Comment   MRSA by PCR POSITIVE (*) NEGATIVE Final   CULTURE, BLOOD (ROUTINE X 2)     Status: Normal (Preliminary result)   Collection Time   11/24/11  4:20 AM      Component Value Range Status Comment    Specimen Description BLOOD LEFT ARM   Final    Special Requests BOTTLES DRAWN AEROBIC ONLY 10CC   Final    Culture  Setup Time 11/24/2011 09:17   Final    Culture     Final    Value:        BLOOD CULTURE RECEIVED NO GROWTH TO DATE CULTURE WILL BE HELD FOR 5 DAYS BEFORE ISSUING A FINAL NEGATIVE REPORT   Report Status PENDING   Incomplete   CULTURE, BLOOD (ROUTINE X 2)     Status: Normal (Preliminary result)   Collection Time   11/24/11  4:30 AM      Component Value Range Status Comment   Specimen Description BLOOD RIGHT HAND   Final    Special Requests BOTTLES DRAWN AEROBIC ONLY 10CC   Final    Culture  Setup Time 11/24/2011 09:17   Final    Culture     Final    Value:        BLOOD CULTURE RECEIVED NO GROWTH TO DATE CULTURE WILL BE HELD FOR 5 DAYS BEFORE ISSUING A FINAL NEGATIVE REPORT   Report Status PENDING   Incomplete     Studies: Dg Chest Portable 1 View  11/23/2011  *RADIOLOGY REPORT*  Clinical Data: Short of breath  PORTABLE CHEST - 1 VIEW  Comparison: 11/02/2011  Findings: Very large hiatal hernia is stable.  Moderate cardiomegaly.  Left infrahilar patchy densities have developed.  No pneumothorax.  No definite pleural effusion.  IMPRESSION: Stable large hiatal hernia  Left infrahilar patchy airspace disease.  Original Report Authenticated By: Donavan Burnet, M.D.   Scheduled Meds:    . albuterol  2.5 mg Nebulization BID  . beta carotene w/minerals  1 tablet Oral Daily  . carvedilol  6.25 mg Oral BID WC  . Chlorhexidine Gluconate Cloth  6 each Topical Q0600  . dutasteride  0.5 mg Oral Daily  . enoxaparin (LOVENOX) injection  40 mg Subcutaneous Q24H  . ferrous sulfate  325 mg Oral BID  . furosemide  40 mg Oral Daily  . hydrOXYzine  10 mg Oral QHS  . losartan  25 mg Oral Daily  . mupirocin ointment  1 application Nasal BID  . omega-3 acid ethyl esters  2 g Oral Q breakfast  . oxybutynin  5 mg Oral QHS  . pantoprazole  40 mg Oral Q1200  . polyethylene glycol  17 g Oral Daily    . sodium chloride  3 mL Intravenous Q12H  . DISCONTD: albuterol  2.5 mg Nebulization Q6H  . DISCONTD: escitalopram  10 mg Oral Daily   Continuous Infusions:   Principal Problem:  *Acute respiratory failure with hypoxia Active Problems:  Acute diastolic CHF (congestive heart failure)  Hyponatremia  Atrial fibrillation, chronic  Anemia  Venous stasis dermatitis of bilateral lower extremity

## 2011-11-27 LAB — BASIC METABOLIC PANEL
Calcium: 8.8 mg/dL (ref 8.4–10.5)
GFR calc Af Amer: >90 mL/min (ref >90)
GFR calc non Af Amer: >90 mL/min (ref >90)
Potassium: 3.7 mEq/L (ref 3.5–5.1)
Sodium: 128 mEq/L — ABNORMAL LOW (ref 135–145)

## 2011-11-27 NOTE — Progress Notes (Signed)
[facility] PROGRESS NOTE  [name] ZOX:[id number] DOB: [birth year] DOA: 11/23/2011 PCP: Florentina Jenny, MD Cardiologist: Carolanne Grumbling, M.D.  Assessment/Plan: 1. Hyponatremia: Improved today. Acute on chronic. Long-standing issue dating to 2008. Lexapro is likely the primary etiology at this point, complicated by volume overload/heart failure. Discontinued Lexapro 7/13. Continue diuresis with oral Lasix. Fluid restriction. 2. Acute respiratory failure: Resolved. Secondary to acute diastolic heart failure. 3. Acute diastolic congestive heart failure: Rapidly resolved and with aggressive diuresis. Presumed diastolic and valvular in nature (moderate mitral valve regurgitation). Continue Lasix. 4. Possible healthcare associated pneumonia: This was considered on admission. However infectious process was doubted doubted and antibiotics discontinued. 5. Atrial fibrillation: Not a Coumadin candidate per cardiology. Continue Coreg. 6. Chronic venous stasis dermatitis bilateral lower extremities:  Appears stable.  Code Status: DO NOT RESUSCITATE Family Communication: None at bedside Disposition Plan: If sodium remains stable anticipate discharge to skilled nursing facility 7/15.  Brendia Sacks, MD  [facility] Pager [phone]. If 8PM-8AM, please contact night-coverage at [email or web address], password [facility] 11/27/2011, 10:07 AM  LOS: 4 days   Brief narrative: 78 year old man brought to the hospital for acute shortness of breath requiring CPAP prior to arrival. Recently hospitalized for altered mental status and hyponatremia thought to be secondary to dehydration versus SIADH. Lasix was held and was discharged back to nursing Center. He was admitted for acute respiratory failure, acute congestive heart failure exacerbation probably related to withholding of Lasix. History with IV diuresis and empiric antibiotics.  Consultants:  Cardiology  Procedures:  2-D echocardiogram: Left  ventricular ejection fraction 55-60%. Grade 1 diastolic dysfunction. Moderate aortic stenosis. Moderate mitral valve regurgitation.   HPI/Subjective: No new issues. Eating well. Breathing well.  Objective: Filed Vitals:   11/26/11 2046 11/27/11 0528 11/27/11 0724 11/27/11 0938  BP: 122/78 138/87  100/67  Pulse: 78 76  77  Temp: 97.9 F (36.6 C) 97.5 F (36.4 C)  98 F (36.7 C)  TempSrc: Oral Oral  Oral  Resp: 18 16  17   Height:      Weight: 67 kg (147 lb 11.3 oz)     SpO2: 98% 97% 99% 95%    Intake/Output Summary (Last 24 hours) at 11/27/11 1007 Last data filed at 11/27/11 0900  Gross per 24 hour  Intake    603 ml  Output   2500 ml  Net  -1897 ml    Exam:   General:  Appears calm and comfortable.  Cardiovascular: irregular rhythm, normal rate; no murmur rub or gallop; no lower extremity edema.  Respiratory: Clear to patient bilaterally. No wheezes, rales, rhonchi. Normal respiratory effort.  Skin: Bilateral chronic venous stasis changes.  Exam remains current 7/14.  Data Reviewed: Basic Metabolic Panel:  Lab 11/27/11 8295 11/26/11 0730 11/25/11 0520 11/24/11 0446 11/23/11 2204  NA 128* 124* 125* 127* 121*  K 3.7 3.5 3.4* 3.8 4.5  CL 90* 86* 86* 89* 86*  CO2 28 29 31 29 21   GLUCOSE 118* 89 92 105* 155*  BUN 7 7 10 10 11   CREATININE 0.56 0.60 0.66 0.58 0.56  CALCIUM 8.8 8.4 8.1* 8.5 8.5  MG -- -- -- -- --  PHOS -- -- -- -- --   CBC:  Lab 11/24/11 0446 11/23/11 2204  WBC 6.5 7.0  NEUTROABS -- 5.8  HGB 9.3* 10.3*  HCT 27.5* 30.9*  MCV 85.4 86.6  PLT 257 316    Basename 11/26/11 0730 11/23/11 2141 10/31/11 1450  PROBNP 3384.0* 4727.0* 5129.0*   CBG:  Lab 11/25/11 0730  GLUCAP 69*    Recent Results (from the past 240 hour(s))  MRSA PCR SCREENING     Status: Abnormal   Collection Time   11/24/11  3:15 AM      Component Value Range Status Comment   MRSA by PCR POSITIVE (*) NEGATIVE Final   CULTURE, BLOOD (ROUTINE X 2)     Status: Normal  (Preliminary result)   Collection Time   11/24/11  4:20 AM      Component Value Range Status Comment   Specimen Description BLOOD LEFT ARM   Final    Special Requests BOTTLES DRAWN AEROBIC ONLY 10CC   Final    Culture  Setup Time 11/24/2011 09:17   Final    Culture     Final    Value:        BLOOD CULTURE RECEIVED NO GROWTH TO DATE CULTURE WILL BE HELD FOR 5 DAYS BEFORE ISSUING A FINAL NEGATIVE REPORT   Report Status PENDING   Incomplete   CULTURE, BLOOD (ROUTINE X 2)     Status: Normal (Preliminary result)   Collection Time   11/24/11  4:30 AM      Component Value Range Status Comment   Specimen Description BLOOD RIGHT HAND   Final    Special Requests BOTTLES DRAWN AEROBIC ONLY 10CC   Final    Culture  Setup Time 11/24/2011 09:17   Final    Culture     Final    Value:        BLOOD CULTURE RECEIVED NO GROWTH TO DATE CULTURE WILL BE HELD FOR 5 DAYS BEFORE ISSUING A FINAL NEGATIVE REPORT   Report Status PENDING   Incomplete     Studies: Dg Chest Portable 1 View  11/23/2011  *RADIOLOGY REPORT*  Clinical Data: Short of breath  PORTABLE CHEST - 1 VIEW  Comparison: 11/02/2011  Findings: Very large hiatal hernia is stable.  Moderate cardiomegaly.  Left infrahilar patchy densities have developed.  No pneumothorax.  No definite pleural effusion.  IMPRESSION: Stable large hiatal hernia  Left infrahilar patchy airspace disease.  Original Report Authenticated By: Donavan Burnet, M.D.   Scheduled Meds:    . albuterol  2.5 mg Nebulization BID  . beta carotene w/minerals  1 tablet Oral Daily  . carvedilol  6.25 mg Oral BID WC  . Chlorhexidine Gluconate Cloth  6 each Topical Q0600  . dutasteride  0.5 mg Oral Daily  . enoxaparin (LOVENOX) injection  40 mg Subcutaneous Q24H  . ferrous sulfate  325 mg Oral BID  . furosemide  40 mg Oral Daily  . hydrOXYzine  10 mg Oral QHS  . losartan  25 mg Oral Daily  . mupirocin ointment  1 application Nasal BID  . omega-3 acid ethyl esters  2 g Oral Q breakfast    . oxybutynin  5 mg Oral QHS  . pantoprazole  40 mg Oral Q1200  . polyethylene glycol  17 g Oral Daily  . sodium chloride  3 mL Intravenous Q12H   Continuous Infusions:   Principal Problem:  *Acute respiratory failure with hypoxia Active Problems:  Acute diastolic CHF (congestive heart failure)  Hyponatremia  Atrial fibrillation, chronic  Anemia  Venous stasis dermatitis of bilateral lower extremity

## 2011-11-28 NOTE — Discharge Summary (Deleted)
error 

## 2011-11-28 NOTE — Progress Notes (Signed)
Physical Therapy Treatment Patient Details Name: [name] MRN: [id number] DOB: 01-16-1927 Today's Date: 11/28/2011 Time: 4098-1191 PT Time Calculation (min): 17 min  PT Assessment / Plan / Recommendation Comments on Treatment Session  Pt very hopeful to return to [facility]; Willing to get up today after much encouragement    Follow Up Recommendations  Skilled nursing facility    Barriers to Discharge        Equipment Recommendations  Defer to next venue    Recommendations for Other Services    Frequency Min 3X/week   Plan Discharge plan remains appropriate;Frequency remains appropriate    Precautions / Restrictions Precautions Precautions: Fall Other Brace/Splint: velcro shoes for possible charcot deformity, not currently here   Pertinent Vitals/Pain Soreness in bottoms of feet; did not rate    Mobility  Bed Mobility Bed Mobility: Supine to Sit;Sitting - Scoot to Edge of Bed Supine to Sit: 5: Supervision;HOB elevated Sitting - Scoot to Edge of Bed: 5: Supervision Details for Bed Mobility Assistance: increased time Transfers Transfers: Sit to Stand;Stand to Sit Sit to Stand: 4: Min assist;From bed;With upper extremity assist Stand to Sit: 4: Min assist;To chair/3-in-1;With upper extremity assist Details for Transfer Assistance: assist for steadying and for rising/sitting.  VC's for hand placement. Ambulation/Gait Ambulation/Gait Assistance: 4: Min assist Ambulation Distance (Feet): 20 Feet Assistive device: Rolling walker Ambulation/Gait Assistance Details: Continues to fatigue quickly Gait Pattern: Step-through pattern;Decreased stride length;Trunk flexed;Narrow base of support    Exercises     PT Diagnosis:    PT Problem List:   PT Treatment Interventions:     PT Goals Acute Rehab PT Goals Time For Goal Achievement: 12/09/11 Potential to Achieve Goals: Fair Pt will go Supine/Side to Sit: with supervision;with HOB 0 degrees PT Goal: Supine/Side to  Sit - Progress: Partly met Pt will go Sit to Stand: with supervision PT Goal: Sit to Stand - Progress: Progressing toward goal Pt will go Stand to Sit: with supervision PT Goal: Stand to Sit - Progress: Progressing toward goal Pt will Ambulate: 16 - 50 feet;with supervision;with least restrictive assistive device PT Goal: Ambulate - Progress: Progressing toward goal  Visit Information  Last PT Received On: 11/28/11 Assistance Needed: +2 (helpful for O2)    Subjective Data  Subjective: Reports not feeling great, hasn't been up in a few days Patient Stated Goal: return to [employer]  Overall Cognitive Status: Appears within functional limits for tasks assessed/performed Arousal/Alertness: Awake/alert Orientation Level: Appears intact for tasks assessed Behavior During Session: [name] for tasks performed    Balance     End of Session PT - End of Session Equipment Utilized During Treatment: Gait belt Activity Tolerance: Patient limited by fatigue Patient left: with call bell/phone within reach (; nurse tech notified) Nurse Communication: Mobility status   GP     [name], [name] [phone]  11/28/2011, 3:22 PM

## 2011-11-28 NOTE — Progress Notes (Signed)
[facility] PROGRESS NOTE  [name] ZOX:[id number] DOB: 1926-07-30 DOA: 11/23/2011 PCP: Florentina Jenny, MD Cardiologist: Carolanne Grumbling, M.D.  Assessment/Plan: 1. Hyponatremia: Improved overall. Acute on chronic. Long-standing issue dating to 2008. Lexapro is likely the primary etiology, complicated by volume overload/heart failure. Discontinued Lexapro 7/13. Continue oral Lasix and fluid restriction. Follow-up sodium as an outpatient. 2. Acute respiratory failure: Resolved. Secondary to acute diastolic heart failure. 3. Acute diastolic congestive heart failure: Rapidly resolved and with aggressive diuresis. Presumed diastolic and valvular in nature (moderate mitral valve regurgitation). Continue Lasix. 4. Possible healthcare associated pneumonia: This was considered on admission. However infectious process was doubted doubted and antibiotics discontinued. 5. Atrial fibrillation: Not a Coumadin candidate per cardiology. Continue Coreg. 6. Chronic venous stasis dermatitis bilateral lower extremities:  Appears stable.  Stable for discharge today.  Code Status: DO NOT RESUSCITATE Family Communication: None at bedside Disposition Plan: Return to [facility].  Brendia Sacks, MD  [facility] Pager [phone]. If 8PM-8AM, please contact night-coverage at [email or web address], password [facility] 11/28/2011, 11:27 AM  LOS: 5 days   Brief narrative: 78 year old man brought to the hospital for acute shortness of breath requiring CPAP prior to arrival. Recently hospitalized for altered mental status and hyponatremia thought to be secondary to dehydration versus SIADH. Lasix was held and was discharged back to nursing Center. He was admitted for acute respiratory failure, acute congestive heart failure exacerbation probably related to withholding of Lasix. History with IV diuresis and empiric antibiotics.  Consultants:  Cardiology  Procedures:  2-D echocardiogram: Left ventricular ejection  fraction 55-60%. Grade 1 diastolic dysfunction. Moderate aortic stenosis. Moderate mitral valve regurgitation.   HPI/Subjective: No concerns per RN. Patient has no complaints.  Objective: Filed Vitals:   11/27/11 2037 11/28/11 0458 11/28/11 0756 11/28/11 0952  BP: 120/70 138/95  113/81  Pulse: 82 94  87  Temp: 98.5 F (36.9 C) 97.8 F (36.6 C)  97.4 F (36.3 C)  TempSrc: Oral Axillary  Oral  Resp: 20 18  18   Height:      Weight: 65.6 kg (144 lb 10 oz)     SpO2: 98% 96% 97% 98%    Intake/Output Summary (Last 24 hours) at 11/28/11 1127 Last data filed at 11/28/11 1118  Gross per 24 hour  Intake   1200 ml  Output    700 ml  Net    500 ml    Exam:   General:  Appears calm and comfortable.  Cardiovascular: irregular rhythm, normal rate; no murmur rub or gallop; no lower extremity edema.  Respiratory: Clear to patient bilaterally. No wheezes, rales, rhonchi. Normal respiratory effort.  Skin: Bilateral chronic venous stasis changes.  Psychiatric: Grossly normal mood and affect. Speech fluent and appropriate  Exam remains current 7/15.  Data Reviewed: Basic Metabolic Panel:  Lab 11/27/11 8295 11/26/11 0730 11/25/11 0520 11/24/11 0446 11/23/11 2204  NA 128* 124* 125* 127* 121*  K 3.7 3.5 3.4* 3.8 4.5  CL 90* 86* 86* 89* 86*  CO2 28 29 31 29 21   GLUCOSE 118* 89 92 105* 155*  BUN 7 7 10 10 11   CREATININE 0.56 0.60 0.66 0.58 0.56  CALCIUM 8.8 8.4 8.1* 8.5 8.5  MG -- -- -- -- --  PHOS -- -- -- -- --   CBC:  Lab 11/24/11 0446 11/23/11 2204  WBC 6.5 7.0  NEUTROABS -- 5.8  HGB 9.3* 10.3*  HCT 27.5* 30.9*  MCV 85.4 86.6  PLT 257 316    Basename 11/26/11 0730  11/23/11 2141 10/31/11 1450  PROBNP 3384.0* 4727.0* 5129.0*   CBG:  Lab 11/25/11 0730  GLUCAP 69*    Recent Results (from the past 240 hour(s))  MRSA PCR SCREENING     Status: Abnormal   Collection Time   11/24/11  3:15 AM      Component Value Range Status Comment   MRSA by PCR POSITIVE (*)  NEGATIVE Final   CULTURE, BLOOD (ROUTINE X 2)     Status: Normal (Preliminary result)   Collection Time   11/24/11  4:20 AM      Component Value Range Status Comment   Specimen Description BLOOD LEFT ARM   Final    Special Requests BOTTLES DRAWN AEROBIC ONLY 10CC   Final    Culture  Setup Time 11/24/2011 09:17   Final    Culture     Final    Value:        BLOOD CULTURE RECEIVED NO GROWTH TO DATE CULTURE WILL BE HELD FOR 5 DAYS BEFORE ISSUING A FINAL NEGATIVE REPORT   Report Status PENDING   Incomplete   CULTURE, BLOOD (ROUTINE X 2)     Status: Normal (Preliminary result)   Collection Time   11/24/11  4:30 AM      Component Value Range Status Comment   Specimen Description BLOOD RIGHT HAND   Final    Special Requests BOTTLES DRAWN AEROBIC ONLY 10CC   Final    Culture  Setup Time 11/24/2011 09:17   Final    Culture     Final    Value:        BLOOD CULTURE RECEIVED NO GROWTH TO DATE CULTURE WILL BE HELD FOR 5 DAYS BEFORE ISSUING A FINAL NEGATIVE REPORT   Report Status PENDING   Incomplete     Studies: Dg Chest Portable 1 View  11/23/2011  *RADIOLOGY REPORT*  Clinical Data: Short of breath  PORTABLE CHEST - 1 VIEW  Comparison: 11/02/2011  Findings: Very large hiatal hernia is stable.  Moderate cardiomegaly.  Left infrahilar patchy densities have developed.  No pneumothorax.  No definite pleural effusion.  IMPRESSION: Stable large hiatal hernia  Left infrahilar patchy airspace disease.  Original Report Authenticated By: Donavan Burnet, M.D.   Scheduled Meds:    . albuterol  2.5 mg Nebulization BID  . beta carotene w/minerals  1 tablet Oral Daily  . carvedilol  6.25 mg Oral BID WC  . Chlorhexidine Gluconate Cloth  6 each Topical Q0600  . dutasteride  0.5 mg Oral Daily  . enoxaparin (LOVENOX) injection  40 mg Subcutaneous Q24H  . ferrous sulfate  325 mg Oral BID  . furosemide  40 mg Oral Daily  . hydrOXYzine  10 mg Oral QHS  . losartan  25 mg Oral Daily  . mupirocin ointment  1  application Nasal BID  . omega-3 acid ethyl esters  2 g Oral Q breakfast  . oxybutynin  5 mg Oral QHS  . pantoprazole  40 mg Oral Q1200  . polyethylene glycol  17 g Oral Daily  . sodium chloride  3 mL Intravenous Q12H   Continuous Infusions:   Principal Problem:  *Acute respiratory failure with hypoxia Active Problems:  Acute diastolic CHF (congestive heart failure)  Hyponatremia  Atrial fibrillation, chronic  Anemia  Venous stasis dermatitis of bilateral lower extremity

## 2011-11-28 NOTE — Progress Notes (Signed)
SUBJECTIVE:  No complaints  OBJECTIVE:   Vitals:   Filed Vitals:   11/27/11 2036 11/27/11 2037 11/28/11 0458 11/28/11 0756  BP:  120/70 138/95   Pulse:  82 94   Temp:  98.5 F (36.9 C) 97.8 F (36.6 C)   TempSrc:  Oral Axillary   Resp:  20 18   Height:      Weight:  65.6 kg (144 lb 10 oz)    SpO2: 94% 98% 96% 97%   I&O's:   Intake/Output Summary (Last 24 hours) at 11/28/11 0850 Last data filed at 11/28/11 0630  Gross per 24 hour  Intake   1200 ml  Output    450 ml  Net    750 ml     PHYSICAL EXAM General: Well developed, well nourished, in no acute distress Lungs:   Clear bilaterally to auscultation and percussion. Heart:   HRRR S1 S2 Pulses are 2+ & equal. Abdomen: Bowel sounds are positive, abdomen soft and non-tender without masses Extremities:   No clubbing, cyanosis or edema.  DP +1 Neuro: Alert and oriented X 3. Psych:  Good affect, responds appropriately   LABS: Basic Metabolic Panel:  Basename 11/27/11 0847 11/26/11 0730  NA 128* 124*  K 3.7 3.5  CL 90* 86*  CO2 28 29  GLUCOSE 118* 89  BUN 7 7  CREATININE 0.56 0.60  CALCIUM 8.8 8.4  MG -- --  PHOS -- --   Coag Panel:   Lab Results  Component Value Date   INR 1.18 11/01/2011   INR 1.16 10/31/2011   INR 1.12 03/05/2011    RADIOLOGY: Dg Chest 2 View  11/02/2011  *RADIOLOGY REPORT*  Clinical Data: Shortness of breath.  Controlled hypertension. History of congestive heart failure.  Former smoker.  CHEST - 2 VIEW  Comparison: 03/03/2011.  11/03/2010. CT 08/22/2010.  Findings: There is stable cardiac silhouette enlargement. Ectasia and nonaneurysmal calcification of the thoracic aorta are seen.  On previous CT a large hiatal hernia was demonstrated with the majority the stomach and a large portion of the transverse colon in the lower chest.  There appears to be of similar configuration on the current examination with bibasilar atelectasis.  No consolidation or pleural effusion is demonstrated.  There is  osteopenic appearance of the bones with changes of degenerative disc disease and degenerative spondylosis. Old healed rib trauma is present.  IMPRESSION: Stable cardiac silhouette enlargement.  On previous CT a large hiatal hernia was demonstrated with the majority the stomach and a large portion of the transverse colon in the lower chest.  There appears to be of similar configuration on the current examination.  There is an element of bibasilar atelectasis associated with the large hiatal hernia.  No consolidation or pleural effusion is seen. Chronic vascular and bony findings are stable and are detailed above.  Original Report Authenticated By: Crawford Givens, M.D.   Dg Chest Portable 1 View  11/23/2011  *RADIOLOGY REPORT*  Clinical Data: Short of breath  PORTABLE CHEST - 1 VIEW  Comparison: 11/02/2011  Findings: Very large hiatal hernia is stable.  Moderate cardiomegaly.  Left infrahilar patchy densities have developed.  No pneumothorax.  No definite pleural effusion.  IMPRESSION: Stable large hiatal hernia  Left infrahilar patchy airspace disease.  Original Report Authenticated By: Donavan Burnet, M.D.      ASSESSMENT:  1. Acute diastolic CHF improving  2. Moderate AS  3. Hyponatremia  4. Pneumonia  PLAN:   No new recs, continue current therapy  Will sign off - call with any questions  Quintella Reichert, MD  11/28/2011  8:50 AM

## 2011-11-28 NOTE — Progress Notes (Signed)
Report called to [facility] and spoke with Marisue Ivan, Charity fundraiser. Pt discharged to facility via ambulance. Pt is hemodynamically stable. Report given to transporters.

## 2011-11-28 NOTE — Clinical Social Work Note (Signed)
[name] medically stable for discharge back to [facility] skilled nursing facility today. Updated dc summary forwarded to facility. CSW facilitated transport to SNF via ambulance. CSW talked with POA who completed readmission paperwork at [facility] today.  Genelle Bal, MSW, LCSW [phone]

## 2011-11-28 NOTE — Progress Notes (Signed)
This visit was in response to a spiritual care consult request.  Pt is preparing to be discharged to [facility] place.  He stated he liked the hospital better, but he is ready to go.  Pt is displaying anxiety over the delay in the discharge.  I helped normalize this pt's anxiety and offered emotional support.   Dellie Catholic  [phone] personal pager

## 2011-11-30 LAB — CULTURE, BLOOD (ROUTINE X 2): Culture: NO GROWTH

## 2011-12-26 ENCOUNTER — Other Ambulatory Visit: Payer: Self-pay | Admitting: Internal Medicine

## 2011-12-26 DIAGNOSIS — M869 Osteomyelitis, unspecified: Secondary | ICD-10-CM

## 2011-12-27 ENCOUNTER — Ambulatory Visit
Admission: RE | Admit: 2011-12-27 | Discharge: 2011-12-27 | Disposition: A | Discharge status: Home / Self Care | Payer: Medicare Other | Source: Ambulatory Visit | Attending: Internal Medicine | Admitting: Internal Medicine

## 2011-12-27 DIAGNOSIS — M869 Osteomyelitis, unspecified: Secondary | ICD-10-CM

## 2011-12-27 MED ORDER — IOHEXOL 300 MG/ML  SOLN
100.0000 mL | Freq: Once | INTRAMUSCULAR | Status: AC | PRN
  Administered 2011-12-27: 100 mL via INTRAVENOUS

## 2012-01-05 ENCOUNTER — Encounter (HOSPITAL_BASED_OUTPATIENT_CLINIC_OR_DEPARTMENT_OTHER): Payer: Medicare Other | Attending: Internal Medicine

## 2012-01-05 DIAGNOSIS — L97509 Non-pressure chronic ulcer of other part of unspecified foot with unspecified severity: Secondary | ICD-10-CM | POA: Insufficient documentation

## 2012-01-05 DIAGNOSIS — E1149 Type 2 diabetes mellitus with other diabetic neurological complication: Secondary | ICD-10-CM | POA: Insufficient documentation

## 2012-01-05 DIAGNOSIS — E1169 Type 2 diabetes mellitus with other specified complication: Secondary | ICD-10-CM | POA: Insufficient documentation

## 2012-01-06 NOTE — Progress Notes (Signed)
[facility]  NAME:  [name], [name]            ACCOUNT NO.:  [id number]  MEDICAL RECORD NO.:  [id number]      DATE OF BIRTH:  [birth year]  PHYSICIAN:  Maxwell Caul, M.D.      VISIT DATE:                                  OFFICE VISIT   [name] comes to [place] from [facility] where he is cared for by Dr. _____.  He apparently was hospitalized in July for an exacerbation of his congestive heart failure, and he was diuresed with Lasix.  He actually had multiple visits here from 2011 until the spring of 2012 with neuropathic wounds on his bilateral feet in the setting of severe foot and ankle deformities.  I note that we have previously labeled him as having neuropathic wounds and in several parts of our old chart it says that these are diabetic foot ulcers, although I see no documentation currently that he is actually a diabetic, although his foot deformities certainly look like "Charcot diabetic deformity." In any case, he is here for review of the wounds on his bilateral feet.  On examination, his bilateral ankles are extremely externally rotated. He has severe plantar foot deformities.  There are 4 wounds that we had defined, one on his medial left first metatarsal head, his medial left foot in the setting of an extreme foot deformity, one on the right medial foot and one on the right plantar surface.  The wound on the left first metatarsal head and the right medial foot deformity were extensively debrided of callus and subcutaneous tissue.  The wound bed is actually cleaned up quite nicely.  We needed to use silver nitrate for hemostasis.  There is no evidence of infection on any of these wounds, however, there is considerable maceration of the skin around the wounds suggesting excessive moisture.  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  We used silver alginate and a Kerlix and net  wrap.  These can be changed every second day.  The facility should be careful not to ambulate this gentleman excessively, although ambulation is for the purpose of physical therapy would seem a reasonable option at this point.  We will see him back in 2 weeks.          ______________________________ Maxwell Caul, M.D.     MGR/MEDQ  D:  01/05/2012  T:  01/05/2012  Job:  [id number]

## 2012-01-19 ENCOUNTER — Encounter (HOSPITAL_BASED_OUTPATIENT_CLINIC_OR_DEPARTMENT_OTHER): Payer: Medicare Other | Attending: Internal Medicine

## 2012-01-19 DIAGNOSIS — E1169 Type 2 diabetes mellitus with other specified complication: Secondary | ICD-10-CM | POA: Insufficient documentation

## 2012-01-19 DIAGNOSIS — L84 Corns and callosities: Secondary | ICD-10-CM | POA: Insufficient documentation

## 2012-01-19 DIAGNOSIS — E1149 Type 2 diabetes mellitus with other diabetic neurological complication: Secondary | ICD-10-CM | POA: Insufficient documentation

## 2012-01-19 DIAGNOSIS — L97509 Non-pressure chronic ulcer of other part of unspecified foot with unspecified severity: Secondary | ICD-10-CM | POA: Insufficient documentation

## 2012-02-09 ENCOUNTER — Encounter (HOSPITAL_BASED_OUTPATIENT_CLINIC_OR_DEPARTMENT_OTHER): Payer: Medicare Other

## 2012-02-21 ENCOUNTER — Encounter (HOSPITAL_BASED_OUTPATIENT_CLINIC_OR_DEPARTMENT_OTHER): Payer: Medicare Other | Attending: General Surgery

## 2012-02-21 DIAGNOSIS — E1149 Type 2 diabetes mellitus with other diabetic neurological complication: Secondary | ICD-10-CM | POA: Insufficient documentation

## 2012-02-21 DIAGNOSIS — E1169 Type 2 diabetes mellitus with other specified complication: Secondary | ICD-10-CM | POA: Insufficient documentation

## 2012-02-21 DIAGNOSIS — L97509 Non-pressure chronic ulcer of other part of unspecified foot with unspecified severity: Secondary | ICD-10-CM | POA: Insufficient documentation

## 2012-03-27 ENCOUNTER — Encounter (HOSPITAL_BASED_OUTPATIENT_CLINIC_OR_DEPARTMENT_OTHER): Payer: Medicare Other

## 2012-04-03 ENCOUNTER — Encounter (HOSPITAL_BASED_OUTPATIENT_CLINIC_OR_DEPARTMENT_OTHER): Payer: Medicare Other

## 2012-06-06 ENCOUNTER — Other Ambulatory Visit: Payer: Self-pay | Admitting: Otolaryngology

## 2012-06-06 DIAGNOSIS — D380 Neoplasm of uncertain behavior of larynx: Secondary | ICD-10-CM

## 2012-06-12 ENCOUNTER — Inpatient Hospital Stay: Admission: RE | Admit: 2012-06-12 | Payer: Medicare Other | Source: Ambulatory Visit

## 2012-06-18 ENCOUNTER — Ambulatory Visit
Admission: RE | Admit: 2012-06-18 | Discharge: 2012-06-18 | Disposition: A | Discharge status: Home / Self Care | Payer: Medicare Other | Source: Ambulatory Visit | Attending: Otolaryngology | Admitting: Otolaryngology

## 2012-06-18 DIAGNOSIS — D380 Neoplasm of uncertain behavior of larynx: Secondary | ICD-10-CM

## 2012-06-18 MED ORDER — IOHEXOL 300 MG/ML  SOLN
75.0000 mL | Freq: Once | INTRAMUSCULAR | Status: AC | PRN
  Administered 2012-06-18: 75 mL via INTRAVENOUS

## 2012-08-10 ENCOUNTER — Non-Acute Institutional Stay (SKILLED_NURSING_FACILITY): Payer: Medicare Other | Admitting: Internal Medicine

## 2012-08-10 DIAGNOSIS — K219 Gastro-esophageal reflux disease without esophagitis: Secondary | ICD-10-CM

## 2012-08-10 DIAGNOSIS — I4891 Unspecified atrial fibrillation: Secondary | ICD-10-CM

## 2012-08-10 DIAGNOSIS — N400 Enlarged prostate without lower urinary tract symptoms: Secondary | ICD-10-CM

## 2012-08-10 DIAGNOSIS — I509 Heart failure, unspecified: Secondary | ICD-10-CM

## 2012-08-11 NOTE — Progress Notes (Signed)
PROGRESS NOTE  DATE: 08/10/12  FACILITY: [facility]  LEVEL OF CARE: SNF  Routine Visit  CHIEF COMPLAINT:  Manage CHF and atrial fibrillation  HISTORY OF PRESENT ILLNESS:  REASSESSMENT OF ONGOING PROBLEMS:  1. CHF:The patient does not relate significant weight changes, denies sob, DOE, orthopnea, PNDs, palpitations or chest pain.  CHF remains stable.  No complications form the medications being used.  Complains of mild lower extremity swelling.  2. ATRIAL FIBRILLATION: the patients a-fib remains stable.  The patient denies DOE, tachycardia, orthopnea, transient neurological sx, palpitations, & PNDs.  No complications noted from the medications currently being used.  PAST MEDICAL HISTORY : Reviewed.  No changes.  CURRENT MEDICATIONS: Reviewed per [facility]  REVIEW OF SYSTEMS:  GENERAL: no change in appetite, no fatigue, no weight changes, no fever, chills or weakness RESPIRATORY: no cough, SOB, DOE,, wheezing, hemoptysis CARDIAC: no chest pain, or palpitations.  C/o LE swelling. GI: no abdominal pain, diarrhea, constipation, heart burn, nausea or vomiting  PHYSICAL EXAMINATION  VS:  T 97.7       P82      RR18      BP 115/58     POX %     WT (Lb)  GENERAL: no acute distress, normal body habitus NECK: supple, trachea midline, no neck masses, no thyroid tenderness, no thyromegaly RESPIRATORY: breathing is even & unlabored, BS CTAB CARDIAC: RRR, no murmur,no extra heart sounds, +1 bilateral lower extremity edema GI: abdomen soft, normal BS, no masses, no tenderness, no hepatomegaly, no splenomegaly PSYCHIATRIC: the patient is alert & oriented to person, affect & behavior appropriate  LABS/RADIOLOGY:  3/14 hemoglobin 12.5, MCV 91.9 otherwise CBC normal 1/14 CMP normal  ASSESSMENT/PLAN:  1. CHF-well-controlled. 2. atrial fibrillation-rate controlled. 3. BPH-denies symptoms. 4. constipation-well-controlled. 5. anemia of chronic disease-stable. 6.  GERD-well-controlled.  CPT CODE: 84696

## 2012-09-21 ENCOUNTER — Non-Acute Institutional Stay (SKILLED_NURSING_FACILITY): Payer: Medicare Other | Admitting: Adult Health

## 2012-09-21 DIAGNOSIS — L0390 Cellulitis, unspecified: Secondary | ICD-10-CM

## 2012-09-21 DIAGNOSIS — R3915 Urgency of urination: Secondary | ICD-10-CM

## 2012-09-21 DIAGNOSIS — I4891 Unspecified atrial fibrillation: Secondary | ICD-10-CM

## 2012-09-21 DIAGNOSIS — I509 Heart failure, unspecified: Secondary | ICD-10-CM

## 2012-09-21 DIAGNOSIS — N400 Enlarged prostate without lower urinary tract symptoms: Secondary | ICD-10-CM

## 2012-09-21 DIAGNOSIS — I482 Chronic atrial fibrillation, unspecified: Secondary | ICD-10-CM

## 2012-09-21 DIAGNOSIS — K5900 Constipation, unspecified: Secondary | ICD-10-CM

## 2012-10-14 DIAGNOSIS — C4A9 Merkel cell carcinoma, unspecified: Secondary | ICD-10-CM

## 2012-10-14 DIAGNOSIS — C4490 Unspecified malignant neoplasm of skin, unspecified: Secondary | ICD-10-CM

## 2012-10-14 HISTORY — DX: Unspecified malignant neoplasm of skin, unspecified: C44.90

## 2012-10-14 HISTORY — DX: Merkel cell carcinoma, unspecified: C4A.9

## 2012-10-19 ENCOUNTER — Other Ambulatory Visit: Payer: Self-pay | Admitting: *Deleted

## 2012-10-19 MED ORDER — ALPRAZOLAM 0.25 MG PO TABS: ORAL_TABLET | ORAL | Status: DC

## 2012-11-02 ENCOUNTER — Non-Acute Institutional Stay (SKILLED_NURSING_FACILITY): Payer: Medicare Other | Admitting: Internal Medicine

## 2012-11-02 DIAGNOSIS — I4891 Unspecified atrial fibrillation: Secondary | ICD-10-CM

## 2012-11-02 DIAGNOSIS — K5900 Constipation, unspecified: Secondary | ICD-10-CM

## 2012-11-02 DIAGNOSIS — N400 Enlarged prostate without lower urinary tract symptoms: Secondary | ICD-10-CM

## 2012-11-02 DIAGNOSIS — I509 Heart failure, unspecified: Secondary | ICD-10-CM

## 2012-11-02 DIAGNOSIS — I482 Chronic atrial fibrillation, unspecified: Secondary | ICD-10-CM

## 2012-11-07 ENCOUNTER — Other Ambulatory Visit: Payer: Self-pay | Admitting: Orthopedic Surgery

## 2012-11-07 ENCOUNTER — Encounter (HOSPITAL_BASED_OUTPATIENT_CLINIC_OR_DEPARTMENT_OTHER): Payer: Self-pay | Admitting: *Deleted

## 2012-11-07 NOTE — Progress Notes (Signed)
Pt is at [facility] place-skilled care-nurse said he gets around in w/c-can tranfer himself-he is alert and oriented and cooperative-his POA will be here Jamesetta So Hooks-[phone] Will need istat Faxing meds so I can tell them with meds to take in am-NPO p mn Arrive 1100

## 2012-11-07 NOTE — H&P (Addendum)
[name] is an 79 y.o. male.   Chief Complaint: 4 x 4 centimeter firm vascular, cutaneous mass dorsal aspect of right hand. HPI: 79 year-old gentleman referred through the courtesy of Dr. Leta Speller for evaluation and management of a 4 x 4 centimeter cutaneous mass on the dorsal aspect of the right hand. This has been present for more than one year it is not painful. There have been episodes of minor trauma and difficult bleeding from the mass. [name] has a history of melanoma of the back excised more than one year ago without evidence of local recurrence.  Past Medical History  Diagnosis Date  . Cellulitis   . GERD (gastroesophageal reflux disease)   . Hypokalemia   . Hypertension   . CHF (congestive heart failure)   . Ankle deformity     broke right ankle and it "was set" no surgery  . Shortness of breath   . Atrial fibrillation   . Pneumonia   . History of bronchitis     "when I was young"  . H/O: whooping cough   . Arthritis     "hands"  . Melanoma of back   . Anxiety   . Wears glasses   . Diabetes mellitus 11/24/11    "I'm at the very beginning; not treated yet"    Past Surgical History  Procedure Laterality Date  . Knee surgery  age 22    1943broke knee cap in [facility] - doesn't remember which knee  . Cataract extraction w/ intraocular lens  implant, bilateral    . Melanoma excision      on my back    No family history on file. Social History:  reports that he has never smoked. He has never used smokeless tobacco. He reports that  drinks alcohol. He reports that he does not use illicit drugs.  Allergies:  Allergies  Allergen Reactions  . Tape Other (See Comments)    "I do better w/paper tape"    No prescriptions prior to admission    No results found for this or any previous visit (from the past 48 hour(s)).  No results found.   A comprehensive review of systems was negative except for: Integument/breast: positive for skin color change and 4  x 4 centimeter mass dorsal aspect of right hand.  Height 5\' 10"  (1.778 m), weight 65.772 kg (145 lb).  General appearance: cooperative Head: atraumatic Neck: no adenopathy, no carotid bruit, no JVD and thyroid not enlarged, symmetric, no tenderness/mass/nodules Resp: clear to auscultation bilaterally Cardio: regular rate and rhythm GI: soft, non-tender; bowel sounds normal; no masses,  no organomegaly Extremities:  Pulses: 2+ and symmetric Skin: Skin color, texture, turgor normal. No rashes or lesions or 4 x 4 centimeter mass dorsal aspect of right hand. This is highly vascular nontender but has a history of bleeding upon trauma. Neurologic: Grossly normal Lymphatic: No epitrochlear or or right axillary lymph nodes palpable.    Assessment/Plan 4 x 4 centimeter mass dorsal aspect of right hand. Does not appear to be fixed to the underlying periosteum or bone. It may be adherent to the epitenon.  Plan is to perform an incisional biopsy for correct histopathologic diagnosis.  When we are able to understand the nature of this mass we will formulate a proper treatment plan. Mr [name] will return to the office in 4 days to review the pathology report and to determine our next plans in managing this mass.    DASNOIT,Dimitriy J 11/07/2012,  4:18 PM

## 2012-11-08 ENCOUNTER — Encounter (HOSPITAL_BASED_OUTPATIENT_CLINIC_OR_DEPARTMENT_OTHER): Payer: Self-pay | Admitting: *Deleted

## 2012-11-08 ENCOUNTER — Encounter (HOSPITAL_BASED_OUTPATIENT_CLINIC_OR_DEPARTMENT_OTHER): Payer: Self-pay | Admitting: Anesthesiology

## 2012-11-08 ENCOUNTER — Ambulatory Visit (HOSPITAL_BASED_OUTPATIENT_CLINIC_OR_DEPARTMENT_OTHER): Payer: Medicare Other | Admitting: Anesthesiology

## 2012-11-08 ENCOUNTER — Encounter (HOSPITAL_BASED_OUTPATIENT_CLINIC_OR_DEPARTMENT_OTHER)
Admission: RE | Disposition: A | Discharge status: Skilled Nursing Facility | Payer: Self-pay | Source: Ambulatory Visit | Attending: Orthopedic Surgery

## 2012-11-08 ENCOUNTER — Ambulatory Visit (HOSPITAL_BASED_OUTPATIENT_CLINIC_OR_DEPARTMENT_OTHER)
Admission: RE | Admit: 2012-11-08 | Discharge: 2012-11-08 | Disposition: A | Discharge status: Skilled Nursing Facility | Payer: Medicare Other | Source: Ambulatory Visit | Attending: Orthopedic Surgery | Admitting: Orthopedic Surgery

## 2012-11-08 DIAGNOSIS — I4891 Unspecified atrial fibrillation: Secondary | ICD-10-CM | POA: Insufficient documentation

## 2012-11-08 DIAGNOSIS — F411 Generalized anxiety disorder: Secondary | ICD-10-CM | POA: Insufficient documentation

## 2012-11-08 DIAGNOSIS — I509 Heart failure, unspecified: Secondary | ICD-10-CM | POA: Insufficient documentation

## 2012-11-08 DIAGNOSIS — C4910 Malignant neoplasm of connective and soft tissue of unspecified upper limb, including shoulder: Secondary | ICD-10-CM | POA: Insufficient documentation

## 2012-11-08 DIAGNOSIS — K5900 Constipation, unspecified: Secondary | ICD-10-CM | POA: Insufficient documentation

## 2012-11-08 DIAGNOSIS — Z8709 Personal history of other diseases of the respiratory system: Secondary | ICD-10-CM | POA: Insufficient documentation

## 2012-11-08 DIAGNOSIS — K219 Gastro-esophageal reflux disease without esophagitis: Secondary | ICD-10-CM | POA: Insufficient documentation

## 2012-11-08 DIAGNOSIS — E119 Type 2 diabetes mellitus without complications: Secondary | ICD-10-CM | POA: Insufficient documentation

## 2012-11-08 DIAGNOSIS — I10 Essential (primary) hypertension: Secondary | ICD-10-CM | POA: Insufficient documentation

## 2012-11-08 DIAGNOSIS — Z9109 Other allergy status, other than to drugs and biological substances: Secondary | ICD-10-CM | POA: Insufficient documentation

## 2012-11-08 DIAGNOSIS — Z8701 Personal history of pneumonia (recurrent): Secondary | ICD-10-CM | POA: Insufficient documentation

## 2012-11-08 DIAGNOSIS — Z85820 Personal history of malignant melanoma of skin: Secondary | ICD-10-CM | POA: Insufficient documentation

## 2012-11-08 DIAGNOSIS — I5032 Chronic diastolic (congestive) heart failure: Secondary | ICD-10-CM | POA: Insufficient documentation

## 2012-11-08 DIAGNOSIS — N400 Enlarged prostate without lower urinary tract symptoms: Secondary | ICD-10-CM | POA: Insufficient documentation

## 2012-11-08 HISTORY — PX: MASS EXCISION: SHX2000

## 2012-11-08 HISTORY — DX: Presence of spectacles and contact lenses: Z97.3

## 2012-11-08 LAB — POCT I-STAT, CHEM 8
BUN: 24 mg/dL — ABNORMAL HIGH (ref 6–23)
Calcium, Ion: 1.14 mmol/L (ref 1.13–1.30)
Chloride: 92 mEq/L — ABNORMAL LOW (ref 96–112)
Creatinine, Ser: 1 mg/dL (ref 0.50–1.35)
Glucose, Bld: 90 mg/dL (ref 70–99)

## 2012-11-08 SURGERY — EXCISION MASS
Anesthesia: Monitor Anesthesia Care | Site: Hand | Laterality: Right | Wound class: Dirty or Infected

## 2012-11-08 MED ORDER — OXYCODONE HCL 5 MG/5ML PO SOLN: 5.0000 mg | Freq: Once | ORAL | Status: DC | PRN

## 2012-11-08 MED ORDER — OXYCODONE HCL 5 MG PO TABS: 5.0000 mg | ORAL_TABLET | Freq: Once | ORAL | Status: DC | PRN

## 2012-11-08 MED ORDER — LIDOCAINE HCL (CARDIAC) 20 MG/ML IV SOLN
INTRAVENOUS | Status: DC | PRN
  Administered 2012-11-08: 60 mg via INTRAVENOUS

## 2012-11-08 MED ORDER — HYDROCODONE-ACETAMINOPHEN 5-325 MG PO TABS: 1.0000 | ORAL_TABLET | Freq: Four times a day (QID) | ORAL | Status: DC | PRN

## 2012-11-08 MED ORDER — METOCLOPRAMIDE HCL 5 MG/ML IJ SOLN: 10.0000 mg | Freq: Once | INTRAMUSCULAR | Status: DC | PRN

## 2012-11-08 MED ORDER — PROPOFOL INFUSION 10 MG/ML OPTIME
INTRAVENOUS | Status: DC | PRN
  Administered 2012-11-08: 75 ug/kg/min via INTRAVENOUS

## 2012-11-08 MED ORDER — FENTANYL CITRATE 0.05 MG/ML IJ SOLN
25.0000 ug | INTRAMUSCULAR | Status: DC | PRN
  Administered 2012-11-08 (×2): 25 ug via INTRAVENOUS

## 2012-11-08 MED ORDER — LACTATED RINGERS IV SOLN
INTRAVENOUS | Status: DC
  Administered 2012-11-08: 12:00:00 via INTRAVENOUS
  Administered 2012-11-08: 10 mL/h via INTRAVENOUS

## 2012-11-08 MED ORDER — ONDANSETRON HCL 4 MG/2ML IJ SOLN
INTRAMUSCULAR | Status: DC | PRN
  Administered 2012-11-08: 4 mg via INTRAVENOUS

## 2012-11-08 MED ORDER — LIDOCAINE HCL 2 % IJ SOLN
INTRAMUSCULAR | Status: DC | PRN
  Administered 2012-11-08: 4.5 mL

## 2012-11-08 MED ORDER — CHLORHEXIDINE GLUCONATE 4 % EX LIQD: 60.0000 mL | Freq: Once | CUTANEOUS | Status: DC

## 2012-11-08 SURGICAL SUPPLY — 50 items
BANDAGE ADHESIVE 1X3 (GAUZE/BANDAGES/DRESSINGS) IMPLANT
BANDAGE COBAN STERILE 2 (GAUZE/BANDAGES/DRESSINGS) ×1 IMPLANT
BANDAGE ELASTIC 3 VELCRO ST LF (GAUZE/BANDAGES/DRESSINGS) IMPLANT
BANDAGE GAUZE ELAST BULKY 4 IN (GAUZE/BANDAGES/DRESSINGS) ×1 IMPLANT
BLADE MINI RND TIP GREEN BEAV (BLADE) IMPLANT
BLADE SURG 15 STRL LF DISP TIS (BLADE) ×1 IMPLANT
BLADE SURG 15 STRL SS (BLADE) ×2
BNDG CMPR 9X4 STRL LF SNTH (GAUZE/BANDAGES/DRESSINGS) ×1
BNDG CMPR MD 5X2 ELC HKLP STRL (GAUZE/BANDAGES/DRESSINGS)
BNDG COHESIVE 1X5 TAN STRL LF (GAUZE/BANDAGES/DRESSINGS) IMPLANT
BNDG ELASTIC 2 VLCR STRL LF (GAUZE/BANDAGES/DRESSINGS) IMPLANT
BNDG ESMARK 4X9 LF (GAUZE/BANDAGES/DRESSINGS) ×1 IMPLANT
BRUSH SCRUB EZ PLAIN DRY (MISCELLANEOUS) ×2 IMPLANT
CLOTH BEACON ORANGE TIMEOUT ST (SAFETY) ×2 IMPLANT
CORDS BIPOLAR (ELECTRODE) IMPLANT
COVER MAYO STAND STRL (DRAPES) ×2 IMPLANT
COVER TABLE BACK 60X90 (DRAPES) ×2 IMPLANT
CUFF TOURNIQUET SINGLE 18IN (TOURNIQUET CUFF) ×1 IMPLANT
DECANTER SPIKE VIAL GLASS SM (MISCELLANEOUS) IMPLANT
DRAIN PENROSE 1/2X12 LTX STRL (WOUND CARE) IMPLANT
DRAIN PENROSE 1/4X12 LTX STRL (WOUND CARE) IMPLANT
DRAPE EXTREMITY T 121X128X90 (DRAPE) ×2 IMPLANT
DRAPE SURG 17X23 STRL (DRAPES) ×2 IMPLANT
GAUZE XEROFORM 1X8 LF (GAUZE/BANDAGES/DRESSINGS) IMPLANT
GLOVE BIO SURGEON STRL SZ 6.5 (GLOVE) ×4 IMPLANT
GLOVE BIOGEL M STRL SZ7.5 (GLOVE) ×1 IMPLANT
GLOVE ORTHO TXT STRL SZ7.5 (GLOVE) ×2 IMPLANT
GOWN BRE IMP PREV XXLGXLNG (GOWN DISPOSABLE) ×2 IMPLANT
GOWN PREVENTION PLUS XLARGE (GOWN DISPOSABLE) ×2 IMPLANT
NDL BLUNT 17GA (NEEDLE) IMPLANT
NEEDLE 27GAX1X1/2 (NEEDLE) ×1 IMPLANT
NEEDLE BLUNT 17GA (NEEDLE) IMPLANT
NS IRRIG 1000ML POUR BTL (IV SOLUTION) IMPLANT
PACK BASIN DAY SURGERY FS (CUSTOM PROCEDURE TRAY) ×2 IMPLANT
PADDING CAST ABS 4INX4YD NS (CAST SUPPLIES) ×1
PADDING CAST ABS COTTON 4X4 ST (CAST SUPPLIES) ×1 IMPLANT
PADDING UNDERCAST 2  STERILE (CAST SUPPLIES) IMPLANT
SPONGE GAUZE 4X4 12PLY (GAUZE/BANDAGES/DRESSINGS) IMPLANT
STOCKINETTE 4X48 STRL (DRAPES) ×2 IMPLANT
STRIP CLOSURE SKIN 1/2X4 (GAUZE/BANDAGES/DRESSINGS) IMPLANT
SUT ETHILON 5 0 P 3 18 (SUTURE)
SUT MERSILENE 4 0 P 3 (SUTURE) IMPLANT
SUT NYLON ETHILON 5-0 P-3 1X18 (SUTURE) IMPLANT
SUT PROLENE 3 0 PS 2 (SUTURE) ×1 IMPLANT
SYR 20CC LL (SYRINGE) IMPLANT
SYR 3ML 23GX1 SAFETY (SYRINGE) IMPLANT
SYR CONTROL 10ML LL (SYRINGE) ×2 IMPLANT
TOWEL OR 17X24 6PK STRL BLUE (TOWEL DISPOSABLE) ×4 IMPLANT
TRAY DSU PREP LF (CUSTOM PROCEDURE TRAY) ×2 IMPLANT
UNDERPAD 30X30 INCONTINENT (UNDERPADS AND DIAPERS) ×2 IMPLANT

## 2012-11-08 NOTE — Op Note (Signed)
[id number] 

## 2012-11-08 NOTE — Anesthesia Preprocedure Evaluation (Signed)
Anesthesia Evaluation  Patient identified by MRN, date of birth, ID band Patient awake    Reviewed: Allergy & Precautions, H&P , NPO status , Patient's Chart, lab work & pertinent test results, reviewed documented beta blocker date and time   Airway Mallampati: II TM Distance: >3 FB Neck ROM: full    Dental   Pulmonary shortness of breath and with exertion, pneumonia -, resolved,  breath sounds clear to auscultation        Cardiovascular hypertension, On Medications and On Home Beta Blockers +CHF negative cardio ROS  + dysrhythmias Atrial Fibrillation Rhythm:regular     Neuro/Psych negative neurological ROS  negative psych ROS   GI/Hepatic Neg liver ROS, GERD-  Medicated and Controlled,  Endo/Other  diabetes  Renal/GU negative Renal ROS  negative genitourinary   Musculoskeletal   Abdominal   Peds  Hematology negative hematology ROS (+) anemia ,   Anesthesia Other Findings See surgeon's H&P   Reproductive/Obstetrics negative OB ROS                           Anesthesia Physical Anesthesia Plan  ASA: III  Anesthesia Plan: MAC   Post-op Pain Management:    Induction:   Airway Management Planned: Simple Face Mask  Additional Equipment:   Intra-op Plan:   Post-operative Plan:   Informed Consent: I have reviewed the patients History and Physical, chart, labs and discussed the procedure including the risks, benefits and alternatives for the proposed anesthesia with the patient or authorized representative who has indicated his/her understanding and acceptance.   Dental Advisory Given  Plan Discussed with: CRNA and Surgeon  Anesthesia Plan Comments:         Anesthesia Quick Evaluation

## 2012-11-08 NOTE — Progress Notes (Signed)
PROGRESS NOTE  DATE: 11/02/12  FACILITY: [facility]  LEVEL OF CARE: SNF  Routine Visit  CHIEF COMPLAINT:  Manage CHF and atrial fibrillation  HISTORY OF PRESENT ILLNESS:  REASSESSMENT OF ONGOING PROBLEMS:  CHF:The patient does not relate significant weight changes, denies sob, DOE, orthopnea, PNDs, palpitations or chest pain.  CHF remains stable.  No complications form the medications being used.  Complains of mild lower extremity swelling.  ATRIAL FIBRILLATION: the patients a-fib remains stable.  The patient denies DOE, tachycardia, orthopnea, transient neurological sx, palpitations, & PNDs.  No complications noted from the medications currently being used.  PAST MEDICAL HISTORY : Reviewed.  No changes.  CURRENT MEDICATIONS: Reviewed per [facility]  REVIEW OF SYSTEMS:  GENERAL: no change in appetite, no fatigue, no weight changes, no fever, chills or weakness RESPIRATORY: no cough, SOB, DOE,, wheezing, hemoptysis CARDIAC: no chest pain, or palpitations.  C/o LE swelling. GI: no abdominal pain, diarrhea, constipation, heart burn, nausea or vomiting  PHYSICAL EXAMINATION  VS:  T 98       P90     RR20     BP 119/88     POX %     WT (Lb)  GENERAL: no acute distress, normal body habitus NECK: supple, trachea midline, no neck masses, no thyroid tenderness, no thyromegaly RESPIRATORY: breathing is even & unlabored, BS CTAB CARDIAC: RRR, no murmur,no extra heart sounds, +1 bilateral lower extremity edema GI: abdomen soft, normal BS, no masses, no tenderness, no hepatomegaly, no splenomegaly PSYCHIATRIC: the patient is alert & oriented to person, affect & behavior appropriate  LABS/RADIOLOGY:  3/14 hemoglobin 12.5, MCV 91.9 otherwise CBC normal 1/14 CMP normal  ASSESSMENT/PLAN:  1. CHF-well-controlled. 2. atrial fibrillation-rate controlled. 3. BPH-denies symptoms. 4. constipation-well-controlled. 5. anemia of chronic disease-stable. 6. GERD-well-controlled.  CPT CODE:  40981

## 2012-11-08 NOTE — Brief Op Note (Signed)
11/08/2012  2:27 PM  PATIENT:  Azucena Kuba  79 y.o. male  PRE-OPERATIVE DIAGNOSIS:  MASS RIGHT HAND  POST-OPERATIVE DIAGNOSIS:  mass right hand  PROCEDURE:  Procedure(s): INCISIONAL BIOPSY OF RIGHT HAND MASS (Right)  SURGEON:  Surgeon(s) and Role:    * Wyn Forster., MD - Primary  PHYSICIAN ASSISTANT:   ASSISTANTS: Surgical technician  ANESTHESIA:   MAC  EBL:  Total I/O In: 400 [I.V.:400] Out: -   BLOOD ADMINISTERED:none  DRAINS: none   LOCAL MEDICATIONS USED:  LIDOCAINE   SPECIMEN:  Biopsy / Limited Resection  DISPOSITION OF SPECIMEN:  PATHOLOGY  COUNTS:  YES  TOURNIQUET:   Total Tourniquet Time Documented: Upper Arm (Right) - 3 minutes Total: Upper Arm (Right) - 3 minutes   DICTATION: .Other Dictation: Dictation Number [id number]  PLAN OF CARE: Discharge to home after PACU  PATIENT DISPOSITION:  PACU - hemodynamically stable.   Delay start of Pharmacological VTE agent (>24hrs) due to surgical blood loss or risk of bleeding: not applicable

## 2012-11-08 NOTE — Anesthesia Postprocedure Evaluation (Signed)
Anesthesia Post Note  Patient: [name]  Procedure(s) Performed: Procedure(s) (LRB): INCISIONAL BIOPSY OF RIGHT HAND MASS (Right)  Anesthesia type: MAC  Patient location: PACU  Post pain: Pain level controlled  Post assessment: Patient's Cardiovascular Status Stable  Last Vitals:  Filed Vitals:   11/08/12 1515  BP: 134/96  Pulse: 81  Temp:   Resp: 17    Post vital signs: Reviewed and stable  Level of consciousness: alert  Complications: No apparent anesthesia complications

## 2012-11-08 NOTE — Anesthesia Procedure Notes (Signed)
Procedure Name: MAC Date/Time: 11/08/2012 2:07 PM Performed by: Trenda Corliss D Pre-anesthesia Checklist: Patient identified, Emergency Drugs available, Suction available, Patient being monitored and Timeout performed Patient Re-evaluated:Patient Re-evaluated prior to inductionOxygen Delivery Method: Simple face mask

## 2012-11-08 NOTE — Transfer of Care (Signed)
Immediate Anesthesia Transfer of Care Note  Patient: [name]  Procedure(s) Performed: Procedure(s): INCISIONAL BIOPSY OF RIGHT HAND MASS (Right)  Patient Location: PACU  Anesthesia Type:MAC  Level of Consciousness: awake, alert , oriented and patient cooperative  Airway & Oxygen Therapy: Patient Spontanous Breathing and Patient connected to face mask oxygen  Post-op Assessment: Report given to PACU RN and Post -op Vital signs reviewed and stable  Post vital signs: Reviewed and stable  Complications: No apparent anesthesia complications

## 2012-11-09 ENCOUNTER — Encounter (HOSPITAL_BASED_OUTPATIENT_CLINIC_OR_DEPARTMENT_OTHER): Payer: Self-pay | Admitting: Orthopedic Surgery

## 2012-11-09 NOTE — Op Note (Signed)
NAME:  [name], [name]            ACCOUNT NO.:  [id number]  MEDICAL RECORD NO.:  [id number]  LOCATION:                               FACILITY:  [facility]  PHYSICIAN:  Katy Fitch. Semaj Kham, M.D. DATE OF BIRTH:  08-18-1926  DATE OF PROCEDURE:  11/08/2012 DATE OF DISCHARGE:  11/08/2012                              OPERATIVE REPORT   PREOPERATIVE DIAGNOSIS:  A 4 x 4 cm vascular cutaneous mass, right dorsal hand.  POSTOPERATIVE DIAGNOSIS:  A 4 x 4 cm vascular cutaneous mass, right dorsal hand.  Diagnosis pending histopathologic evaluation of incisional biopsy specimen.  OPERATION:  Incisional biopsy of cutaneous mass, right hand.  OPERATING SURGEON:  Katy Fitch. Epic Tribbett, M.D.  ASSISTANT:  Surgical technician.  ANESTHESIA:  2% lidocaine field block supplemented by IV sedation.  SUPERVISING ANESTHESIOLOGIST:  Dr. Gelene Mink.  INDICATIONS:  [name] is a 79 year old gentleman referred through the courtesy of Dr. Leta Speller, attending dermatologist, for evaluation of a large highly vascular mass on the dorsal aspect of his right hand.  This has been present for more than 1 year.  [name] has a history of remote melanoma.  He had no regional lymph nodes, and no signs of axillary nodes on the right.  We advised incisional biopsy due to the appearance of the mass, which included a highly vascular appearance, history of spontaneous bleeding and telangiectasia.  Our plan is to obtain a histopathologic evaluation followed by appropriate surgical planning.  Should this turn out to be a significant malignancy, we may need to contact one of our [facility] center's.  If this is a more straight forward lesion, we will be able to perform excision and grafting with or without the assistance of our plastic surgery colleagues.  After informed consent, [name] is brought to the operating room at this time.  PROCEDURE IN DETAIL:  [name] was brought to room #1 of the  [facility] and placed in supine position on the operating table. Following IV sedation, under Dr. Thornton Dales direct supervision, 2% lidocaine was infiltrated proximal to lesion obtaining anesthesia to the regional cutaneous nerves.  We did place some lidocaine distally so as to allow removal of small wedge of normal skin at the lesion normal- appearing skin junction.  After few moments anesthesia was satisfactory.  The hand was elevated for 30 seconds, and the arterial tourniquet on the proximal forearm inflated to 220 mmHg.  Procedure commenced with a wedge resection of a portion of the mass measuring 15 mm in length, 5 mm width crossing the border with the normal-appearing skin.  The mass was excised down to the level of the epitenon.  There did not appear to be invasion of the tendons.  [name] skin is very thin and he likely has extension of the mass to the subcutaneous fat.  The segment of the lesion was placed directly into formalin and passed off for pathologic and Dermatologic evaluation.  The wound was then closed with a series of simple sutures of 4-0 Prolene.  The tourniquet was released and pressure held on wound for 5 minutes.  A compressive dressing was applied with sterile gauze and Coban.  There were  no apparent complications.  We will see [name] back for followup in 4 days to discuss his pathologic evaluation and decide on our next step in treatment.  A coverage consult may be requested from Plastic Surgery if wide margins are required.     Katy Fitch Nuriya Stuck, M.D.     RVS/MEDQ  D:  11/08/2012  T:  11/09/2012  Job:  [id number]

## 2012-11-15 IMAGING — CR DG CHEST 2V
2 series · 2 of 2 positions shown · non-contrast
Comparison: Chest x-ray of 08/22/2010

CLINICAL DATA: Shortness of breath, cough

CHEST - 2 VIEW

[w chest pa]
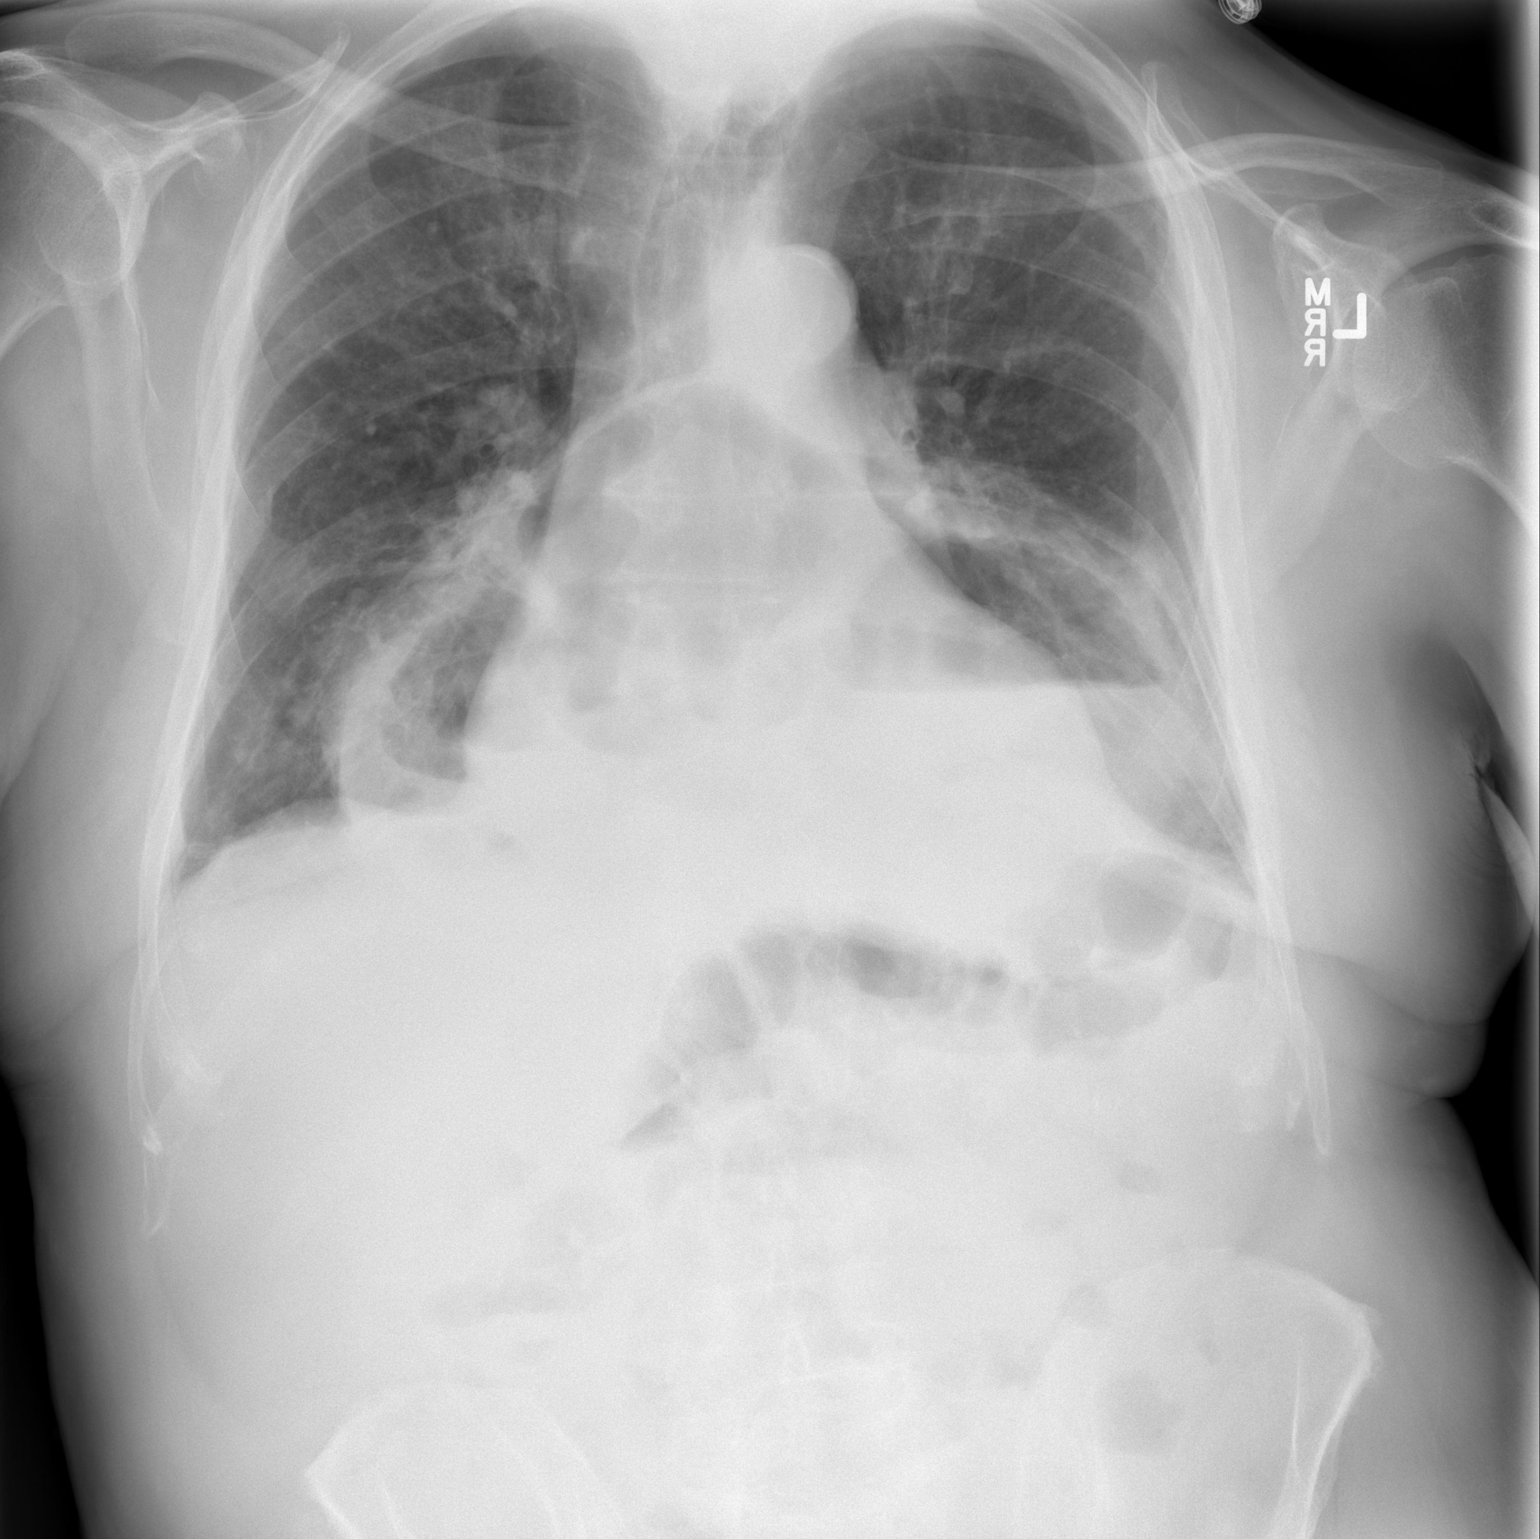

[w chest lat]
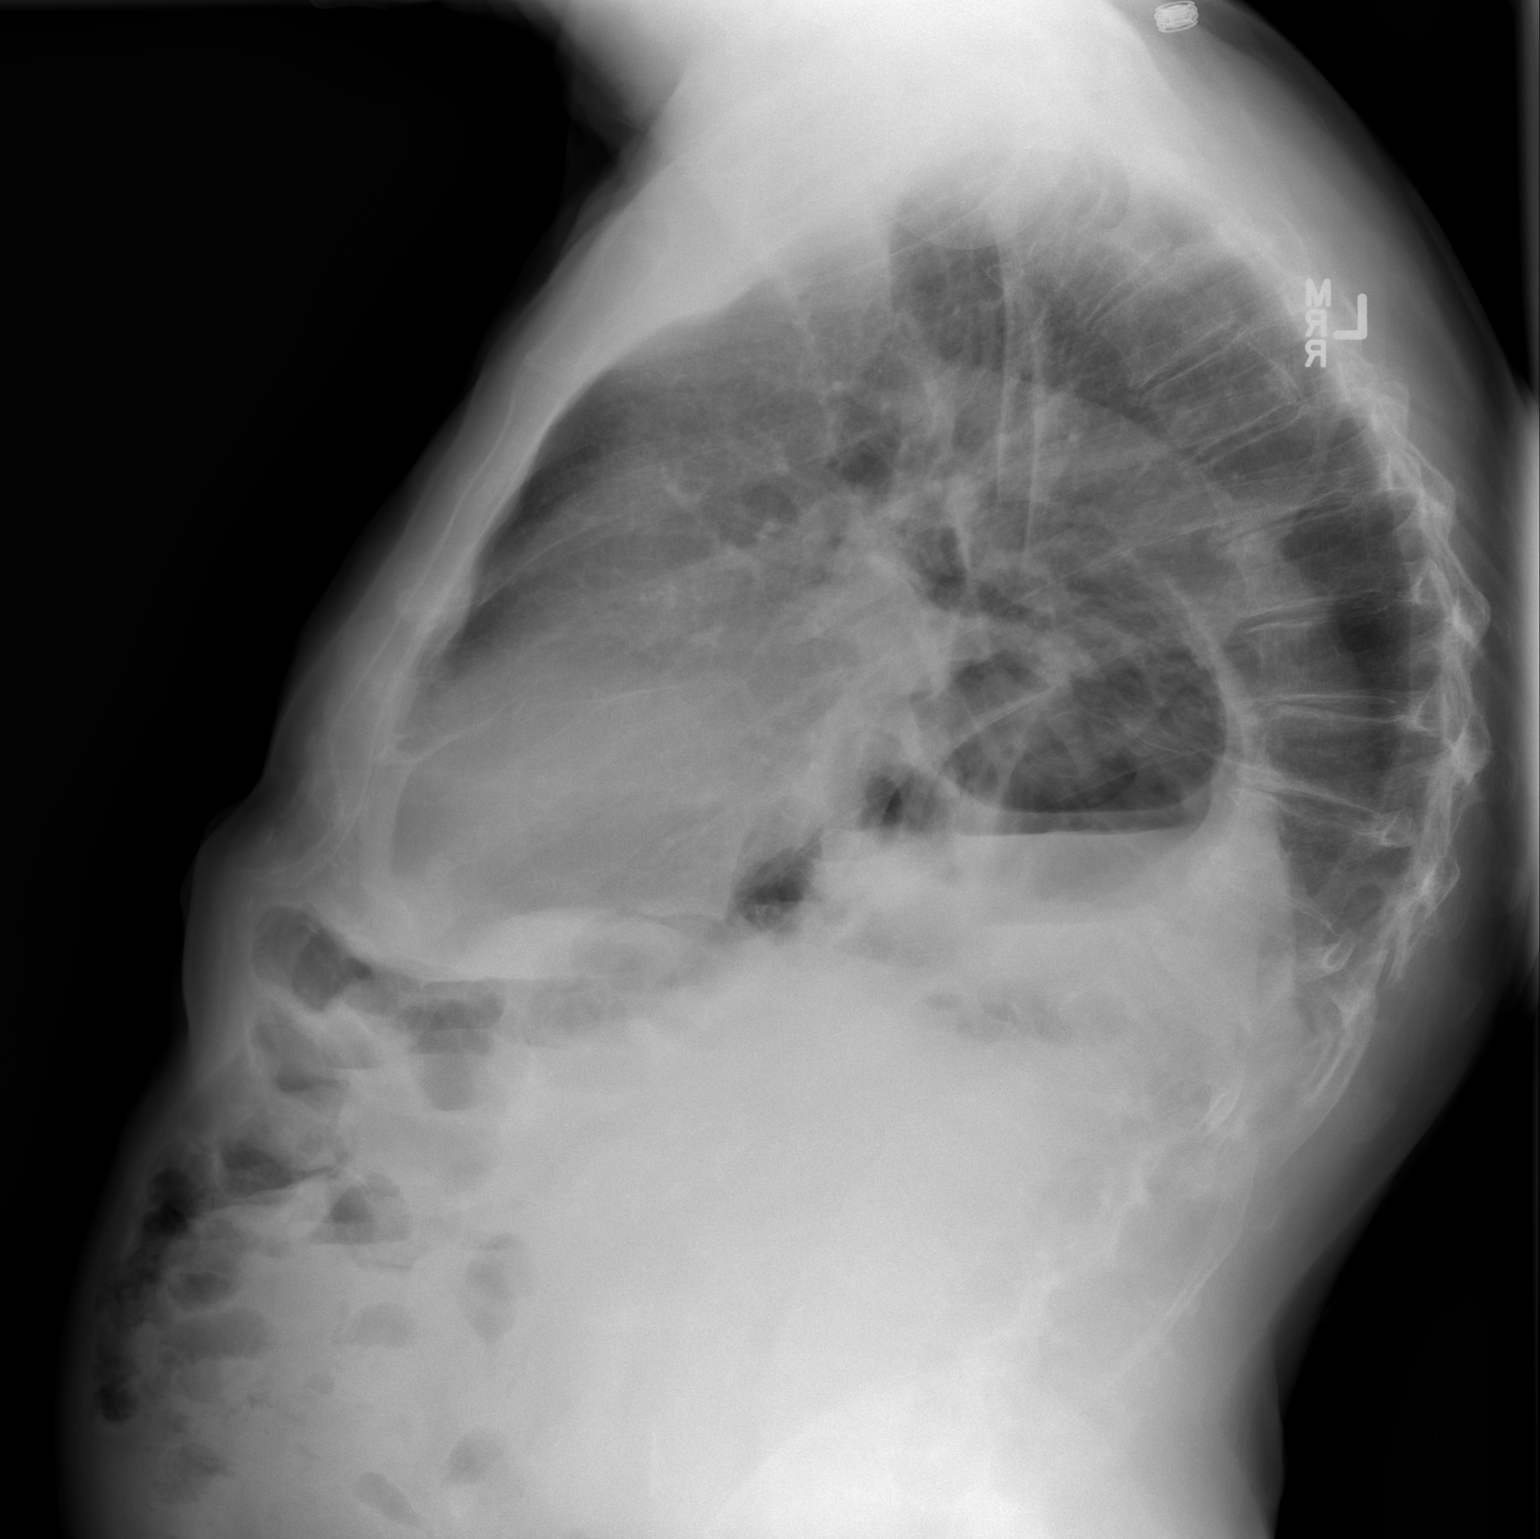

[2 of 2 positions shown; findings below may reference images not displayed]

FINDINGS: No active infiltrate or effusion is seen.  A large hiatal
hernia again is noted probably containing the stomach and other
loops of bowel as well.  Mild cardiomegaly is stable.  No acute
bony abnormality is seen.  There is a thoracic kyphosis present.
IMPRESSION: 1.  Stable large hiatal hernia with mild cardiomegaly.
2.  No active lung disease.

## 2012-11-29 ENCOUNTER — Ambulatory Visit
Admission: RE | Admit: 2012-11-29 | Discharge: 2012-11-29 | Disposition: A | Discharge status: Home / Self Care | Payer: Medicare Other | Source: Ambulatory Visit | Attending: Radiation Oncology | Admitting: Radiation Oncology

## 2012-11-29 ENCOUNTER — Encounter: Payer: Self-pay | Admitting: Radiation Oncology

## 2012-11-29 VITALS — BP 104/67 | HR 76 | Temp 98.3°F | Resp 20

## 2012-11-29 DIAGNOSIS — Z85820 Personal history of malignant melanoma of skin: Secondary | ICD-10-CM | POA: Insufficient documentation

## 2012-11-29 DIAGNOSIS — L814 Other melanin hyperpigmentation: Secondary | ICD-10-CM | POA: Insufficient documentation

## 2012-11-29 DIAGNOSIS — C4A9 Merkel cell carcinoma, unspecified: Secondary | ICD-10-CM

## 2012-11-29 DIAGNOSIS — I10 Essential (primary) hypertension: Secondary | ICD-10-CM | POA: Insufficient documentation

## 2012-11-29 DIAGNOSIS — L308 Other specified dermatitis: Secondary | ICD-10-CM | POA: Insufficient documentation

## 2012-11-29 DIAGNOSIS — L821 Other seborrheic keratosis: Secondary | ICD-10-CM | POA: Insufficient documentation

## 2012-11-29 DIAGNOSIS — Z510 Encounter for antineoplastic radiation therapy: Secondary | ICD-10-CM | POA: Insufficient documentation

## 2012-11-29 DIAGNOSIS — I4891 Unspecified atrial fibrillation: Secondary | ICD-10-CM | POA: Insufficient documentation

## 2012-11-29 DIAGNOSIS — C4A60 Merkel cell carcinoma of unspecified upper limb, including shoulder: Secondary | ICD-10-CM | POA: Insufficient documentation

## 2012-11-29 DIAGNOSIS — H9190 Unspecified hearing loss, unspecified ear: Secondary | ICD-10-CM | POA: Insufficient documentation

## 2012-11-29 DIAGNOSIS — I509 Heart failure, unspecified: Secondary | ICD-10-CM | POA: Insufficient documentation

## 2012-11-29 DIAGNOSIS — Z79899 Other long term (current) drug therapy: Secondary | ICD-10-CM | POA: Insufficient documentation

## 2012-11-29 HISTORY — DX: Melanocytic nevi, unspecified: D22.9

## 2012-11-29 HISTORY — DX: Merkel cell carcinoma, unspecified: C4A.9

## 2012-11-29 HISTORY — DX: Other seborrheic keratosis: L82.1

## 2012-11-29 HISTORY — DX: Nevus, non-neoplastic: I78.1

## 2012-11-29 HISTORY — DX: Unspecified malignant neoplasm of skin, unspecified: C44.90

## 2012-11-29 HISTORY — DX: Other specified dermatitis: L30.8

## 2012-11-29 HISTORY — DX: Other skin changes due to chronic exposure to nonionizing radiation: L57.8

## 2012-11-29 HISTORY — DX: Other melanin hyperpigmentation: L81.4

## 2012-11-29 HISTORY — DX: Unspecified macular degeneration: H35.30

## 2012-11-29 HISTORY — DX: Hemangioma of skin and subcutaneous tissue: D18.01

## 2012-11-29 NOTE — Progress Notes (Signed)
[facility] Radiation Oncology NEW PATIENT EVALUATION  Name: [name] MRN: [id number]  Date:   11/29/2012           DOB: Oct 23, 1926  Status: outpatient    CC: Dr. Arminda Resides, Dr. Lucretia Roers, [facility] (FAX [phone]) , Dr. Hebah Bogosian Hodgkins   REFERRING PHYSICIAN: Dr. Arminda Resides  DIAGNOSIS:  Merkel cell carcinoma, dorsum of the right hand  HISTORY OF PRESENT ILLNESS:  [name] is a 79 y.o. male who is seen today for the courtesy of Dr. Karlyn Agee for evaluation of his Merkel cell carcinoma arising from the dorsum of his right hand. He states that he first noted a growth along the dorsum of his right hand approximately 8 months ago. He noted interval growth after traumatizing his hand a few months ago.Marland Kitchen He was seen by Dr. Leta Speller of [facility] and then referred to Dr. Laroy Apple for further evaluation. Dr. 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 He was scheduled for referral at [facility] to [facility] for possible excision. He communicated to Dr. Karlyn Agee that he wanted to explore the possibility of radiation therapy as an alternative. He is without complaints. I do not think he's had a staging workup. He has lost approximately 5 pounds over the past 6 months.  PREVIOUS RADIATION THERAPY: No   PAST MEDICAL HISTORY:  has a past medical history of Cellulitis; GERD (gastroesophageal reflux disease); Hypokalemia; Hypertension; CHF (congestive heart failure); Ankle deformity; Shortness of breath; Atrial fibrillation; Pneumonia; History of bronchitis; H/O: whooping cough; Arthritis; Melanoma of back; Anxiety; Wears glasses; Diabetes mellitus (11/24/11); Actinic skin damage; Lentigines; Seborrheic keratoses; Cherry angioma; Multiple benign nevi; Asteatotic dermatitis; Skin cancer (10/2012); Macular degeneration, left eye; and Merkel cell cancer (10/2012).      PAST SURGICAL HISTORY:  Past Surgical History  Procedure Laterality Date  . Knee surgery  age 53    1943broke knee cap in [facility] - doesn't remember which knee  . Cataract extraction w/ intraocular lens  implant, bilateral    . Melanoma excision  many years ago    on my back  . Mass excision Right 11/08/2012    Procedure: INCISIONAL BIOPSY OF RIGHT HAND MASS;  Surgeon: Wyn Forster., MD;  Location: [facility];  Service: Orthopedics;  Laterality: Right;     FAMILY HISTORY: family history includes Cancer in his cousin and maternal aunt.   SOCIAL HISTORY:  reports that he has never smoked. He has never used smokeless tobacco. He reports that  drinks alcohol. He reports that he does not use illicit drugs. Never married, no children. He worked as an Wellsite geologist for 30 years, and was also a Advertising account planner, and Radio producer. He resides at [facility].   ALLERGIES: Tape   MEDICATIONS:  Current Outpatient Prescriptions  Medication Sig Dispense Refill  . ALPRAZolam (XANAX) 0.25 MG tablet Take one tablet by mouth twice daily as needed for anxiety  60 tablet  5  . Calcium Carbonate-Vitamin D (CALCIUM 500 + D PO) Take 1 tablet by mouth daily.      . carvedilol (COREG) 6.25 MG tablet Take 6.25 mg by mouth 2 (two) times daily with a meal.      . dutasteride (AVODART) 0.5 MG capsule Take 0.5 mg by mouth daily.      . fish oil-omega-3 fatty acids 1000 MG capsule Take 2  g by mouth daily.      . furosemide (LASIX) 20 MG tablet Take 20 mg by mouth.      Marland Kitchen HYDROcodone-acetaminophen (NORCO) 5-325 MG per tablet Take 1 tablet by mouth every 6 (six) hours as needed for pain.  20 tablet  0  . hydrOXYzine (ATARAX/VISTARIL) 10 MG tablet Take 10 mg by mouth at bedtime.      Marland Kitchen losartan (COZAAR) 25 MG tablet Take 25 mg by mouth daily.      . Multiple Vitamins-Minerals (DECUBI-VITE PO) Take 1 tablet by mouth daily.      . multivitamin-lutein (OCUVITE-LUTEIN) CAPS Take 1 capsule by mouth daily.       . nitroGLYCERIN (NITROSTAT) 0.4 MG SL tablet Place 0.4 mg under the tongue every 5 (five) minutes as needed. For chest pain      . oxybutynin (DITROPAN) 5 MG tablet Take 5 mg by mouth at bedtime.       . pantoprazole (PROTONIX) 40 MG tablet Take 40 mg by mouth daily.      . polyethylene glycol (MIRALAX / GLYCOLAX) packet Take 17 g by mouth daily.      Marland Kitchen Propylene Glycol (SYSTANE BALANCE OP) Place 1 drop into both eyes 2 (two) times daily.      . traMADol (ULTRAM) 50 MG tablet Take 50 mg by mouth every 6 (six) hours as needed for pain.       No current facility-administered medications for this encounter.     REVIEW OF SYSTEMS:  Pertinent items are noted in HPI.    PHYSICAL EXAM:  oral temperature is 98.3 F (36.8 C). His blood pressure is 104/67 and his pulse is 76. His respiration is 20.   Alert and oriented 79 year old white male appearing his stated age. Nodes: There is no palpable supraclavicular, axillary, or epitrochlear adenopathy. On inspection of the right hand there is a purplish cutaneous 3.5 x 3.5 x 1.5 cm mass overlying the right fourth and fifth metacarpals. The mass is mobile with respect to the deep structures. Neurovascular intact.    LABORATORY DATA:  Lab Results  Component Value Date   WBC 6.5 11/24/2011   HGB 12.9* 11/08/2012   HCT 38.0* 11/08/2012   MCV 85.4 11/24/2011   PLT 257 11/24/2011   Lab Results  Component Value Date   NA 131* 11/08/2012   K 4.0 11/08/2012   CL 92* 11/08/2012   CO2 28 11/27/2011   Lab Results  Component Value Date   ALT 21 11/01/2011   AST 18 11/01/2011   ALKPHOS 71 11/01/2011   BILITOT 0.5 11/01/2011      IMPRESSION: Merkel cell carcinoma arising from the dorsum of the right hand. We discussed the natural history of Merkel cell carcinoma with the potential for both lymphatic and hematogenous spread. I gather that he has a life expectancy of more than 6 months to year. I explained to his first cousin, Freddy Jaksch (ph # [phone]) that  the preferred approach would be surgical resection followed by postoperative radiation therapy. It appears that he would require skin graft, and this would delay postoperative radiation therapy. I believe that this would be preferable to radiation therapy alone in terms of durable local control even though Merkel cell carcinoma is felt to be radiosensitive. Prior to definitive surgery or radiation therapy he should have at least a chest x-ray or CT the chest in addition to blood chemistries including liver enzymes. There is no obvious metastatic disease at this point. I discussed  the potential acute and late toxicities of radiation therapy which would generally be well tolerated with electron beam radiation. If he is felt to have a limited life expectancy, then he could receive palliative radiation therapy over a period of 2 weeks.   PLAN: As discussed above. His first cousin will make an appointment at [facility] to see Dr.Ollila. I can see the patient back here for postoperative radiation therapy or definitive/palliative radiation therapy depending on his choice of therapy.  I spent 30 minutes minutes face to face with the patient and more than 50% of that time was spent in counseling and/or coordination of care.

## 2012-11-29 NOTE — Progress Notes (Signed)
Histology and Location of Primary Skin Cancer: dorsal right hand Soft tissue mass, simple excision, Right hand - ANAPLASTIC SMALL CELL CARCINOMA CONSISTENT WITH MERKEL CELL CARCINOMA.  Patient presented with the following signs/symptoms:  Bumps, redness  1 month ago  Past/Anticipated interventions by patient's surgeon/dermatologist for current problematic lesion, if any: 11/08/12 incisional biopsy of cutaneous mass, right hand  Past skin cancers, if any:  1) Location/Histology/Intervention: melanoma in situ upper right back- excison 07/29/08  2) Location/Histology/Intervention: SCC left malar face: E, D&C (KA type) 01/10/11  3) Location/Histology/Intervention: Merkel cell right hand 10/2012  History of Blistering sunburns, if any: history of significant sun exposure  SAFETY ISSUES:  Prior radiation? no  Pacemaker/ICD? no  Possible current pregnancy? na  Is the patient on methotrexate? no  Current Complaints / other details:  No children, art teacher x 30 years, Advertising account planner, Radio producer. Living at [facility]. Pt's cousin and friend with him today. Pt has raised nodule on dorsal right hand w/purplish appearance. He states it is tender at times, history of bleeding.

## 2012-11-29 NOTE — Progress Notes (Signed)
Please see the Nurse Progress Note in the MD Initial Consult Encounter for this patient. 

## 2012-12-03 ENCOUNTER — Telehealth: Payer: Self-pay | Admitting: *Deleted

## 2012-12-03 ENCOUNTER — Non-Acute Institutional Stay (SKILLED_NURSING_FACILITY): Payer: Medicare Other | Admitting: Internal Medicine

## 2012-12-03 DIAGNOSIS — I4891 Unspecified atrial fibrillation: Secondary | ICD-10-CM

## 2012-12-03 DIAGNOSIS — K5900 Constipation, unspecified: Secondary | ICD-10-CM

## 2012-12-03 DIAGNOSIS — I482 Chronic atrial fibrillation, unspecified: Secondary | ICD-10-CM

## 2012-12-03 DIAGNOSIS — N400 Enlarged prostate without lower urinary tract symptoms: Secondary | ICD-10-CM

## 2012-12-03 DIAGNOSIS — I509 Heart failure, unspecified: Secondary | ICD-10-CM

## 2012-12-03 NOTE — Telephone Encounter (Signed)
On 12-03-12 check with Dr. Basilio Cairo to get her ok to fax consult note to [facility] .

## 2012-12-06 NOTE — Progress Notes (Signed)
PROGRESS NOTE  DATE: 12/03/12  FACILITY: [facility]  LEVEL OF CARE: SNF  Routine Visit  CHIEF COMPLAINT:  Manage CHF and atrial fibrillation  HISTORY OF PRESENT ILLNESS:  REASSESSMENT OF ONGOING PROBLEMS:  CHF:The patient does not relate significant weight changes, denies sob, DOE, orthopnea, PNDs, palpitations or chest pain.  CHF remains stable.  No complications form the medications being used.  Complains of mild lower extremity swelling.  ATRIAL FIBRILLATION: the patients a-fib remains stable.  The patient denies DOE, tachycardia, orthopnea, transient neurological sx, palpitations, & PNDs.  No complications noted from the medications currently being used.  PAST MEDICAL HISTORY : Reviewed.  No changes.  CURRENT MEDICATIONS: Reviewed per [facility]  REVIEW OF SYSTEMS:  GENERAL: no change in appetite, no fatigue, no weight changes, no fever, chills or weakness RESPIRATORY: no cough, SOB, DOE,, wheezing, hemoptysis CARDIAC: no chest pain, or palpitations.  C/o LE swelling. GI: no abdominal pain, diarrhea, constipation, heart burn, nausea or vomiting  PHYSICAL EXAMINATION  VS:  Not recorded  GENERAL: no acute distress, normal body habitus NECK: supple, trachea midline, no neck masses, no thyroid tenderness, no thyromegaly RESPIRATORY: breathing is even & unlabored, BS CTAB CARDIAC: RRR, no murmur,no extra heart sounds, +1 bilateral lower extremity edema GI: abdomen soft, normal BS, no masses, no tenderness, no hepatomegaly, no splenomegaly PSYCHIATRIC: the patient is alert & oriented to person, affect & behavior appropriate  LABS/RADIOLOGY:  7/14 BMP normal, liver profile normal, TSH 1.816, hemoglobin 12.8, MCV 90.7 otherwise CBC normal  3/14 hemoglobin 12.5, MCV 91.9 otherwise CBC normal 1/14 CMP normal  ASSESSMENT/PLAN:  CHF-well-controlled. atrial fibrillation-rate controlled. BPH-denies symptoms. constipation-well-controlled. anemia of chronic  disease-stable. GERD-well-controlled.  CPT CODE: 08657

## 2012-12-14 ENCOUNTER — Telehealth: Payer: Self-pay | Admitting: *Deleted

## 2012-12-14 NOTE — Telephone Encounter (Signed)
Nira Conn of patient called and stated Mr.Harris was seen at [facility] Dr. Rico Ala and he wanted patient to have radiation before  Surgery, will transfer call to scheduler Otis Peak, to set ip with Dr.Murray, informed Mr,. Chrisman, Dr.Murray is out of office today 9:38 AM

## 2012-12-17 ENCOUNTER — Ambulatory Visit
Admission: RE | Admit: 2012-12-17 | Discharge: 2012-12-17 | Disposition: A | Discharge status: Home / Self Care | Payer: Medicare Other | Source: Ambulatory Visit | Attending: Radiation Oncology | Admitting: Radiation Oncology

## 2012-12-17 DIAGNOSIS — C4A9 Merkel cell carcinoma, unspecified: Secondary | ICD-10-CM

## 2012-12-17 NOTE — Progress Notes (Signed)
Electron beam simulation/treatment planning note: The patient was placed supine on the treatment table with his right arm and hand placed prone. An Acuform immobilization device was constructed. I outlined the electron beam field with a 2 cm margin around his gross disease. One custom block is constructed to conform the field. A special port plan is requested. 0.5-0.8 cm custom bolus will be constructed in applied to the skin on the first day of treatment, tomorrow. I prescribing 4500 cGy 25 sessions utilizing 6 MEV electrons. He'll begin his treatment tomorrow.

## 2012-12-18 ENCOUNTER — Ambulatory Visit
Admission: RE | Admit: 2012-12-18 | Discharge: 2012-12-18 | Disposition: A | Discharge status: Home / Self Care | Payer: Medicare Other | Source: Ambulatory Visit | Attending: Radiation Oncology | Admitting: Radiation Oncology

## 2012-12-19 ENCOUNTER — Ambulatory Visit
Admission: RE | Admit: 2012-12-19 | Discharge: 2012-12-19 | Disposition: A | Discharge status: Home / Self Care | Payer: Medicare Other | Source: Ambulatory Visit | Attending: Radiation Oncology | Admitting: Radiation Oncology

## 2012-12-20 ENCOUNTER — Ambulatory Visit
Admission: RE | Admit: 2012-12-20 | Discharge: 2012-12-20 | Disposition: A | Discharge status: Home / Self Care | Payer: Medicare Other | Source: Ambulatory Visit | Attending: Radiation Oncology | Admitting: Radiation Oncology

## 2012-12-21 ENCOUNTER — Ambulatory Visit
Admission: RE | Admit: 2012-12-21 | Discharge: 2012-12-21 | Disposition: A | Discharge status: Home / Self Care | Payer: Medicare Other | Source: Ambulatory Visit | Attending: Radiation Oncology | Admitting: Radiation Oncology

## 2012-12-24 ENCOUNTER — Ambulatory Visit: Payer: Medicare Other

## 2012-12-25 ENCOUNTER — Ambulatory Visit
Admission: RE | Admit: 2012-12-25 | Discharge: 2012-12-25 | Disposition: A | Discharge status: Home / Self Care | Payer: Medicare Other | Source: Ambulatory Visit | Attending: Radiation Oncology | Admitting: Radiation Oncology

## 2012-12-25 VITALS — BP 112/69 | HR 95 | Temp 97.7°F | Ht 70.0 in

## 2012-12-25 DIAGNOSIS — C4A9 Merkel cell carcinoma, unspecified: Secondary | ICD-10-CM

## 2012-12-25 DIAGNOSIS — C4490 Unspecified malignant neoplasm of skin, unspecified: Secondary | ICD-10-CM

## 2012-12-25 MED ORDER — RADIAPLEXRX EX GEL
Freq: Once | CUTANEOUS | Status: AC
  Administered 2012-12-25: 15:00:00 via TOPICAL

## 2012-12-25 NOTE — Progress Notes (Signed)
   Weekly Management Note:  outpatient Current Dose:  9 Gy  Projected Dose: 45 Gy   Narrative:  The patient presents for routine under treatment assessment.  CBCT/MVCT images/Port film x-rays were reviewed.  The chart was checked.Feels lesion has decreased in size over right hand.  Poor appetite, friends concerned about nutritional intake.  Reports some stress/anxeity related to diagnosis  Physical Findings:  height is 5\' 10"  (1.778 m). His temperature is 97.7 F (36.5 C). His blood pressure is 112/69 and his pulse is 95.  red raised lesion, right hand. Skin intact  Impression:  The patient is tolerating radiotherapy.  Plan:  Continue radiotherapy as planned. Will refer to nutrition.  Pt will also speak w/ nutritionist at his [facility] about ways to have a lunch packed if he has an appointment in the middday.  Talked about good nutritional options to consider in interim.  Stress/anxiety seem appropriate given recent changes in health.  If persistent, Dr Dayton Scrape may make a social work referral.  ________________________________   Lonie Peak, M.D.

## 2012-12-25 NOTE — Progress Notes (Signed)
[name] here for weekly under treat visit in a wheelchair with his friends.  He has had 5 fractions to his right hand.  He denies pain and fatigue.  He says his appetite is poor and wondering if it will improve.  He was given the Radiation Therapy and You book and discussed potential side effects including fatigue and skin changes.  He was given radiaplex gel and was instructed to apply it twice a day if his skin is red.

## 2012-12-26 ENCOUNTER — Telehealth: Payer: Self-pay | Admitting: *Deleted

## 2012-12-26 ENCOUNTER — Ambulatory Visit
Admission: RE | Admit: 2012-12-26 | Discharge: 2012-12-26 | Disposition: A | Discharge status: Home / Self Care | Payer: Medicare Other | Source: Ambulatory Visit | Attending: Radiation Oncology | Admitting: Radiation Oncology

## 2012-12-26 IMAGING — CR DG CHEST 2V
1 series · 1 of 1 positions shown · non-contrast
Comparison: Two-view chest x-ray 08/24/2010.  CT chest 08/22/2010.

CLINICAL DATA: Fever.  Shortness of breath.  Left-sided rib pain.

CHEST - 2 VIEW

[w chest lat]
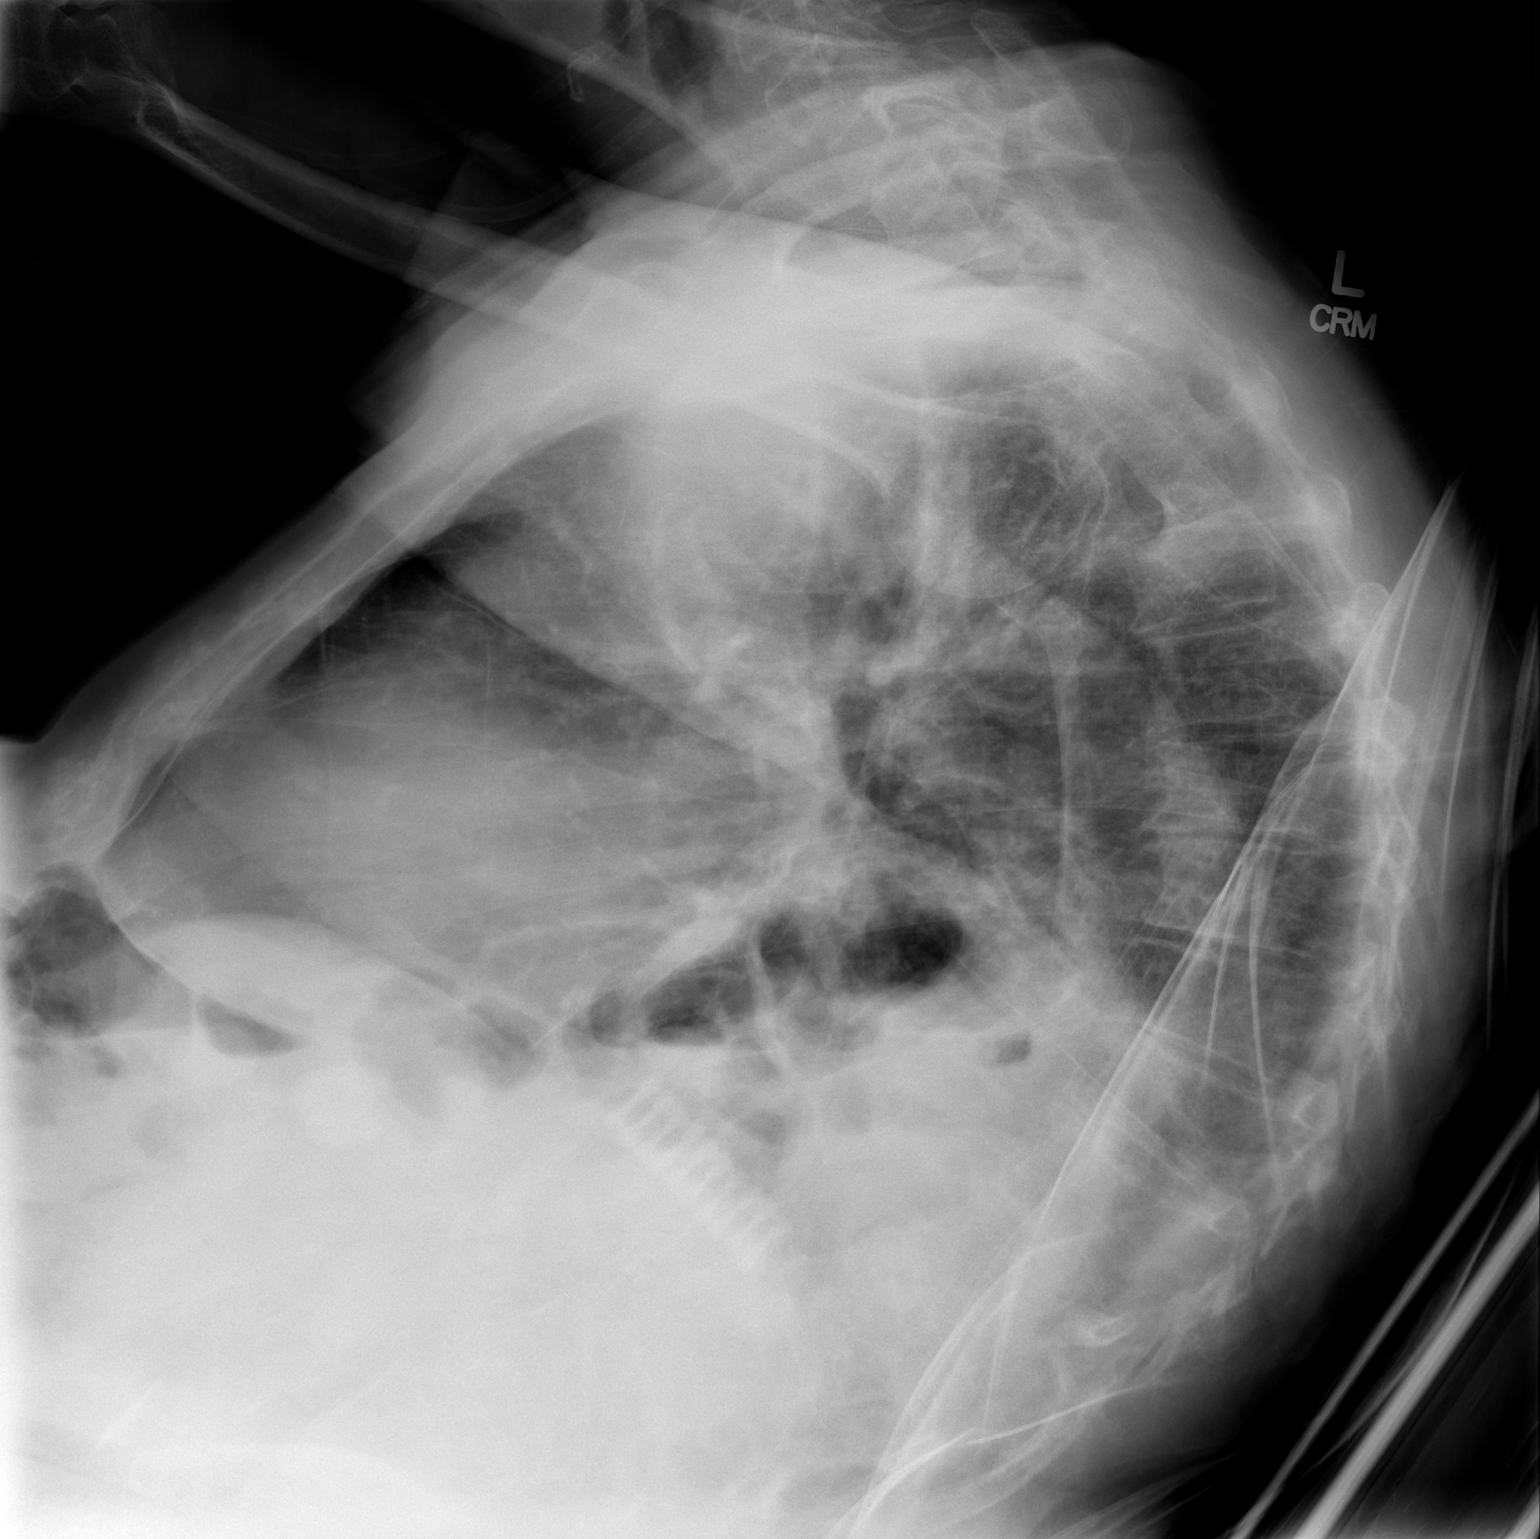

[1 of 1 positions shown; findings below may reference images not displayed]

FINDINGS: The heart is enlarged.  A large hiatal hernia contains
both stomach and colon.  The lateral views suggest superimposed
airspace disease in the lower lobe, likely on the right.  The upper
lung fields are clear.  The visualized soft tissues and bony thorax
are unremarkable.
IMPRESSION: 1.  Stable cardiomegaly without failure.
2.  Large hiatal hernia contains the stomach and colon.
3.  New superimposed lower lobe airspace disease suggest pneumonia,
likely on the right.

## 2012-12-26 NOTE — Telephone Encounter (Signed)
CALLED PATIENT TO INFORM OF NUTRITION APPT. FOR 01-01-13, LVM FOR HIS NURSE TO RETURN MY CALL

## 2012-12-27 ENCOUNTER — Ambulatory Visit
Admission: RE | Admit: 2012-12-27 | Discharge: 2012-12-27 | Disposition: A | Discharge status: Home / Self Care | Payer: Medicare Other | Source: Ambulatory Visit | Attending: Radiation Oncology | Admitting: Radiation Oncology

## 2012-12-28 ENCOUNTER — Ambulatory Visit
Admission: RE | Admit: 2012-12-28 | Discharge: 2012-12-28 | Disposition: A | Discharge status: Home / Self Care | Payer: Medicare Other | Source: Ambulatory Visit | Attending: Radiation Oncology | Admitting: Radiation Oncology

## 2012-12-28 NOTE — Progress Notes (Signed)
[name] came by with his friend to have a mole checked on his right arm.  He does have two raised, scaley white bumps on the back of his upper arm.  He reports that they have been there for a while.  He states that they are itchy but not painful.  He will have Dr. Dayton Scrape look at them on Monday.

## 2012-12-31 ENCOUNTER — Ambulatory Visit
Admission: RE | Admit: 2012-12-31 | Discharge: 2012-12-31 | Disposition: A | Discharge status: Home / Self Care | Payer: Medicare Other | Source: Ambulatory Visit | Attending: Radiation Oncology | Admitting: Radiation Oncology

## 2012-12-31 ENCOUNTER — Encounter: Payer: Self-pay | Admitting: Radiation Oncology

## 2012-12-31 VITALS — BP 104/73 | HR 82 | Temp 97.8°F

## 2012-12-31 DIAGNOSIS — C4A9 Merkel cell carcinoma, unspecified: Secondary | ICD-10-CM

## 2012-12-31 NOTE — Progress Notes (Signed)
Weekly Management Note:  Site: Dorsum of his right hand Current Dose:  1620  cGy Projected Dose: 4500  cGy  Narrative: The patient is seen today for routine under treatment assessment. CBCT/MVCT images/port films were reviewed. The chart was reviewed.   He is without complaints today. He is pleased with his tumor regression.  Physical Examination:  Filed Vitals:   12/31/12 1328  BP: 104/73  Pulse: 82  Temp: 97.8 F (36.6 C)  .  Weight:  . There has been obvious flattening of his carcinoma. It is less purplish. The lesion measures 3.5 cm in greatest dimensions.  Impression: Tolerating radiation therapy well.  Plan: Continue radiation therapy as planned.

## 2012-12-31 NOTE — Progress Notes (Signed)
Mr [name] has received 9 fractions to his right hand.  The area has decreased in size and is less painful.  He is worried about 2 areas in the right, inner elbow which is also itchy.

## 2013-01-01 ENCOUNTER — Ambulatory Visit
Admission: RE | Admit: 2013-01-01 | Discharge: 2013-01-01 | Disposition: A | Discharge status: Home / Self Care | Payer: Medicare Other | Source: Ambulatory Visit | Attending: Radiation Oncology | Admitting: Radiation Oncology

## 2013-01-01 ENCOUNTER — Encounter: Payer: Medicare Other | Admitting: Nutrition

## 2013-01-01 ENCOUNTER — Ambulatory Visit: Payer: Medicare Other

## 2013-01-01 ENCOUNTER — Ambulatory Visit: Payer: Medicare Other | Admitting: Radiation Oncology

## 2013-01-02 ENCOUNTER — Ambulatory Visit
Admission: RE | Admit: 2013-01-02 | Discharge: 2013-01-02 | Disposition: A | Discharge status: Home / Self Care | Payer: Medicare Other | Source: Ambulatory Visit | Attending: Radiation Oncology | Admitting: Radiation Oncology

## 2013-01-02 ENCOUNTER — Non-Acute Institutional Stay (SKILLED_NURSING_FACILITY): Payer: Medicare Other | Admitting: Adult Health

## 2013-01-02 DIAGNOSIS — I482 Chronic atrial fibrillation, unspecified: Secondary | ICD-10-CM

## 2013-01-02 DIAGNOSIS — L0291 Cutaneous abscess, unspecified: Secondary | ICD-10-CM

## 2013-01-02 DIAGNOSIS — L0390 Cellulitis, unspecified: Secondary | ICD-10-CM

## 2013-01-02 DIAGNOSIS — I509 Heart failure, unspecified: Secondary | ICD-10-CM

## 2013-01-02 DIAGNOSIS — N400 Enlarged prostate without lower urinary tract symptoms: Secondary | ICD-10-CM

## 2013-01-02 DIAGNOSIS — R3915 Urgency of urination: Secondary | ICD-10-CM

## 2013-01-02 DIAGNOSIS — I4891 Unspecified atrial fibrillation: Secondary | ICD-10-CM

## 2013-01-02 DIAGNOSIS — K5900 Constipation, unspecified: Secondary | ICD-10-CM

## 2013-01-02 DIAGNOSIS — C4A9 Merkel cell carcinoma, unspecified: Secondary | ICD-10-CM

## 2013-01-03 ENCOUNTER — Ambulatory Visit
Admission: RE | Admit: 2013-01-03 | Discharge: 2013-01-03 | Disposition: A | Discharge status: Home / Self Care | Payer: Medicare Other | Source: Ambulatory Visit | Attending: Radiation Oncology | Admitting: Radiation Oncology

## 2013-01-04 ENCOUNTER — Ambulatory Visit
Admission: RE | Admit: 2013-01-04 | Discharge: 2013-01-04 | Disposition: A | Discharge status: Home / Self Care | Payer: Medicare Other | Source: Ambulatory Visit | Attending: Radiation Oncology | Admitting: Radiation Oncology

## 2013-01-07 ENCOUNTER — Ambulatory Visit
Admission: RE | Admit: 2013-01-07 | Discharge: 2013-01-07 | Disposition: A | Discharge status: Home / Self Care | Payer: Medicare Other | Source: Ambulatory Visit | Attending: Radiation Oncology | Admitting: Radiation Oncology

## 2013-01-07 ENCOUNTER — Encounter: Payer: Self-pay | Admitting: Radiation Oncology

## 2013-01-07 VITALS — BP 107/63 | HR 96 | Temp 98.0°F | Resp 20 | Wt 126.7 lb

## 2013-01-07 DIAGNOSIS — C4A9 Merkel cell carcinoma, unspecified: Secondary | ICD-10-CM

## 2013-01-07 NOTE — Progress Notes (Signed)
Weekly Management Note:  Site: Dorsum of her right hand Current Dose:  2520  cGy Projected Dose: 4500  cGy  Narrative: The patient is seen today for routine under treatment assessment. CBCT/MVCT images/port films were reviewed. The chart was reviewed.   He is without complaints today.  Physical Examination:  Filed Vitals:   01/07/13 1343  BP: 107/63  Pulse: 96  Temp: 98 F (36.7 C)  Resp: 20  .  Weight: 126 lb 11.2 oz (57.471 kg). There has been further flattening of his primary lesion. The diameter still measures approximately 3.5 cm.  Impression: Tolerating radiation therapy well.  Plan: Continue radiation therapy as planned.

## 2013-01-07 NOTE — Progress Notes (Signed)
Weekly rad tx right hand , slight erythema   Size of a 50 c piece, almost flat,No c/opain or discomfort,

## 2013-01-08 ENCOUNTER — Ambulatory Visit
Admission: RE | Admit: 2013-01-08 | Discharge: 2013-01-08 | Disposition: A | Discharge status: Home / Self Care | Payer: Medicare Other | Source: Ambulatory Visit | Attending: Radiation Oncology | Admitting: Radiation Oncology

## 2013-01-09 ENCOUNTER — Ambulatory Visit
Admission: RE | Admit: 2013-01-09 | Discharge: 2013-01-09 | Disposition: A | Discharge status: Home / Self Care | Payer: Medicare Other | Source: Ambulatory Visit | Attending: Radiation Oncology | Admitting: Radiation Oncology

## 2013-01-10 ENCOUNTER — Encounter: Payer: Self-pay | Admitting: Adult Health

## 2013-01-10 ENCOUNTER — Ambulatory Visit
Admission: RE | Admit: 2013-01-10 | Discharge: 2013-01-10 | Disposition: A | Discharge status: Home / Self Care | Payer: Medicare Other | Source: Ambulatory Visit | Attending: Radiation Oncology | Admitting: Radiation Oncology

## 2013-01-10 DIAGNOSIS — R3915 Urgency of urination: Secondary | ICD-10-CM | POA: Insufficient documentation

## 2013-01-10 DIAGNOSIS — N400 Enlarged prostate without lower urinary tract symptoms: Secondary | ICD-10-CM | POA: Insufficient documentation

## 2013-01-10 DIAGNOSIS — L0390 Cellulitis, unspecified: Secondary | ICD-10-CM | POA: Insufficient documentation

## 2013-01-10 NOTE — Progress Notes (Signed)
Patient ID: [name], male   DOB: [birth year], 79 y.o.   MRN: [id number]       PROGRESS NOTE  DATE: 01/02/2013  FACILITY: Nursing Home Location: [facility]  LEVEL OF CARE: SNF (31)  Routine Visit  CHIEF COMPLAINT:  Manage congestive heart failure, BPH, atrial fibrillation, hypertension and constipation  HISTORY OF PRESENT ILLNESS: This is an 79 year old male who was noted to have minimal serosanguineous drainage on bilateral plantar wounds. Bilateral lower extremity skin is warm to touch and erythematous.   REASSESSMENT OF ONGOING PROBLEM(S):  BPH: The patient's BPH remains stable. Patient denies urinary hesitancy or dribbling. No complications reported from the current medications being used.  HTN: Pt 's HTN remains stable.  Denies CP, sob, DOE, pedal edema, headaches, dizziness or visual disturbances.  No complications from the medications currently being used.  Last BP : 118/88  ATRIAL FIBRILLATION: the patients atrial fibrillation remains stable.  The patient denies DOE, tachycardia, orthopnea, transient neurological sx, pedal edema, palpitations, & PNDs.  No complications noted from the medications currently being used.  PAST MEDICAL HISTORY : Reviewed.  No changes.  CURRENT MEDICATIONS: Reviewed per [facility]  REVIEW OF SYSTEMS:  GENERAL: no change in appetite, no fatigue, no weight changes, no fever, chills or weakness RESPIRATORY: no cough, SOB, DOE, wheezing, hemoptysis CARDIAC: no chest pain, edema or palpitations GI: no abdominal pain, diarrhea, constipation, heart burn, nausea or vomiting  PHYSICAL EXAMINATION  VS:  T 96.6       P 94      RR 20      BP 118/80     POX 95 %     WT 135.6 (Lb)  GENERAL: no acute distress, normal body habitus EYES: conjunctivae normal, sclerae normal, normal eye lids NECK: supple, trachea midline, no neck masses, no thyroid tenderness, no thyromegal RESPIRATORY: breathing is even & unlabored, BS CTAB CARDIAC: RRR,  no murmur,no extra heart sounds, BLE edema, 1+ GI: abdomen soft, normal BS, no masses, no tenderness, no hepatomegaly, no splenomegaly PSYCHIATRIC: the patient is alert & oriented to person, affect & behavior appropriate  LABS/RADIOLOGY: 7/14 BMP normal, liver profile normal, TSH 1.816, hemoglobin 12.8, MCV 90.7 otherwise CBC normal 3/14 hemoglobin 12.5, MCV 91.9 otherwise CBC normal 1/14 CMP normal   ASSESSMENT/PLAN:  Hypertension -well controlled  CHF - stable  Merkel cell carcinoma on the right hand - currently getting radiation treatment  Atrial fibrillation - rate controlled  Cellulitis of bilateral feet with plantar wounds - start doxycycline 100 mg 1 by mouth twice a day x2 weeks  BPH - stable  Constipation - no complaints    CPT CODE: 46962

## 2013-01-10 NOTE — Progress Notes (Signed)
Patient ID: [name], male   DOB: 07/03/1926, 79 y.o.   MRN: [id number]       PROGRESS NOTE  DATE: 09/21/2012  FACILITY: Nursing Home Location: [facility]  LEVEL OF CARE: SNF (31)  Routine Visit  CHIEF COMPLAINT:  Manage as an congestive heart failure, atrial fibrillation, urinary urgency and BPH  HISTORY OF PRESENT ILLNESS: This is an 79 year old male was noted to have bilateral plantar wound serosanguineous drainage. Bilateral feet is erythematous and warm to touch.  REASSESSMENT OF ONGOING PROBLEM(S):  CHF:The patient does not relate significant weight changes, denies sob, DOE, orthopnea, PNDs, pedal edema, palpitations or chest pain.  CHF remains stable.  No complications form the medications being used.  BPH: The patient's BPH remains stable. Patient denies urinary hesitancy or dribbling. No complications reported from the current medications being used.  ATRIAL FIBRILLATION: the patients atrial fibrillation remains stable.  The patient denies DOE, tachycardia, orthopnea, transient neurological sx, pedal edema, palpitations, & PNDs.  No complications noted from the medications currently being used.  PAST MEDICAL HISTORY : Reviewed.  No changes.  CURRENT MEDICATIONS: Reviewed per [facility]  REVIEW OF SYSTEMS:  GENERAL: no change in appetite, no fatigue, no weight changes, no fever, chills or weakness RESPIRATORY: no cough, SOB, DOE, wheezing, hemoptysis CARDIAC: no chest pain,or palpitations, edema GI: no abdominal pain, diarrhea, constipation, heart burn, nausea or vomiting  PHYSICAL EXAMINATION  VS:  T 97.5       P 86      RR 20      BP 105/66     POX 97 %     WT 142.8 (Lb)  GENERAL: no acute distress, normal body habitus EYES: conjunctivae normal, sclerae normal, normal eye lids LYMPHATICS: no LAN in the neck, no supraclavicular LAN RESPIRATORY: breathing is even & unlabored, BS CTAB CARDIAC: RRR, no murmur,no extra heart sounds, BLE edema,  1+ GI: abdomen soft, normal BS, no masses, no tenderness, no hepatomegaly, no splenomegaly PSYCHIATRIC: the patient is alert & oriented to person, affect & behavior appropriate  LABS/RADIOLOGY: 3/14 hemoglobin 12.5, MCV 91.9 otherwise CBC normal 1/14 CMP normal  ASSESSMENT/PLAN:   Cellulitis of bilateral lower extremity (plantar wounds) - start Bactrim DS 1 tab by mouth twice a day x2 weeks  Congestive heart failure - stable  BPH - stable  Atrial fibrillation - rate controlled  Constipation - no complaints  Urinary urgency - stable   CPT CODE: 27253

## 2013-01-11 ENCOUNTER — Ambulatory Visit
Admission: RE | Admit: 2013-01-11 | Discharge: 2013-01-11 | Disposition: A | Discharge status: Home / Self Care | Payer: Medicare Other | Source: Ambulatory Visit | Attending: Radiation Oncology | Admitting: Radiation Oncology

## 2013-01-15 ENCOUNTER — Ambulatory Visit
Admission: RE | Admit: 2013-01-15 | Discharge: 2013-01-15 | Disposition: A | Discharge status: Home / Self Care | Payer: Medicare Other | Source: Ambulatory Visit | Attending: Radiation Oncology | Admitting: Radiation Oncology

## 2013-01-15 ENCOUNTER — Encounter: Payer: Self-pay | Admitting: Radiation Oncology

## 2013-01-15 VITALS — BP 101/79 | HR 82 | Temp 97.8°F | Ht 70.0 in

## 2013-01-15 DIAGNOSIS — C4A9 Merkel cell carcinoma, unspecified: Secondary | ICD-10-CM

## 2013-01-15 NOTE — Progress Notes (Signed)
Weekly Management Note:  Site: Dorsum of the right hand Current Dose:  3420  cGy Projected Dose: 4500  cGy  Narrative: The patient is seen today for routine under treatment assessment. CBCT/MVCT images/port films were reviewed. The chart was reviewed.   He is without new complaints today except for diminished hearing which is unrelated to his radiation therapy.  Physical Examination:  Filed Vitals:   01/15/13 1323  BP: 101/79  Pulse: 82  Temp: 97.8 F (36.6 C)  .  Weight:  . There has been further flattening of his right hand lesion which measures 3 x 3 cm in greatest diameter. On inspection of both ear canals there is a moderate amount of wax which would not explain his recent hearing loss.  Impression: Tolerating radiation therapy well. He'll finish at 4500 cGy 25 sessions as a preoperative dose and then go to [facility] for surgery 3-6 weeks postoperatively.  Plan: Continue radiation therapy as planned. He has 6 more treatments she will finish later next week. One-month followup visit with me. He and his caretakers were instructed to make an appointment to visit [facility] with the next 3-4 weeks.

## 2013-01-15 NOTE — Progress Notes (Signed)
[name] has received 19 treatments to the dorsum of the right hand .  He denies any pain , but note erythema in the tx. field.  He reports that since starting radiation he notices decreas in his hearing and he has a "static" like sound in his ears.  He notes that this started since radiation therapy, but seems to be better at home.

## 2013-01-16 ENCOUNTER — Ambulatory Visit
Admission: RE | Admit: 2013-01-16 | Discharge: 2013-01-16 | Disposition: A | Discharge status: Home / Self Care | Payer: Medicare Other | Source: Ambulatory Visit | Attending: Radiation Oncology | Admitting: Radiation Oncology

## 2013-01-17 ENCOUNTER — Ambulatory Visit
Admission: RE | Admit: 2013-01-17 | Discharge: 2013-01-17 | Disposition: A | Discharge status: Home / Self Care | Payer: Medicare Other | Source: Ambulatory Visit | Attending: Radiation Oncology | Admitting: Radiation Oncology

## 2013-01-18 ENCOUNTER — Ambulatory Visit
Admission: RE | Admit: 2013-01-18 | Discharge: 2013-01-18 | Disposition: A | Discharge status: Home / Self Care | Payer: Medicare Other | Source: Ambulatory Visit | Attending: Radiation Oncology | Admitting: Radiation Oncology

## 2013-01-21 ENCOUNTER — Ambulatory Visit
Admission: RE | Admit: 2013-01-21 | Discharge: 2013-01-21 | Disposition: A | Discharge status: Home / Self Care | Payer: Medicare Other | Source: Ambulatory Visit | Attending: Radiation Oncology | Admitting: Radiation Oncology

## 2013-01-21 ENCOUNTER — Encounter: Payer: Self-pay | Admitting: Radiation Oncology

## 2013-01-21 VITALS — BP 108/83 | HR 84 | Temp 97.7°F | Resp 20

## 2013-01-21 DIAGNOSIS — C4A9 Merkel cell carcinoma, unspecified: Secondary | ICD-10-CM

## 2013-01-21 NOTE — Progress Notes (Signed)
   Weekly Management Note:  outpatient Current Dose:  41.4 Gy  Projected Dose: 45 Gy   Narrative:  The patient presents for routine under treatment assessment.  CBCT/MVCT images/Port film x-rays were reviewed.  The chart was checked. No new complaints.  Surgery on hand anticipated in early Oct.  Physical Findings:  oral temperature is 97.7 F (36.5 C). His blood pressure is 108/83 and his pulse is 84. His respiration is 20.  Right hand tumor has regressed since first week of RT.  No skin breakdown.  Still has palpable lesion with erythema, dorsum right hand  Impression:  The patient is tolerating radiotherapy.  Plan:  Continue radiotherapy as planned. 1 mo f/u.  ________________________________   Lonie Peak, M.D.

## 2013-01-21 NOTE — Progress Notes (Signed)
Weekly rad txs, 23/25 right hand, slight erythema on skin, dry, no skin breakd down,,patient in good spirits,gave 1 month f/u appt to be made with Dr.Murray no c/o pain 1:37 PM'

## 2013-01-22 ENCOUNTER — Ambulatory Visit
Admission: RE | Admit: 2013-01-22 | Discharge: 2013-01-22 | Disposition: A | Discharge status: Home / Self Care | Payer: Medicare Other | Source: Ambulatory Visit | Attending: Radiation Oncology | Admitting: Radiation Oncology

## 2013-01-23 ENCOUNTER — Ambulatory Visit
Admission: RE | Admit: 2013-01-23 | Discharge: 2013-01-23 | Disposition: A | Discharge status: Home / Self Care | Payer: Medicare Other | Source: Ambulatory Visit | Attending: Radiation Oncology | Admitting: Radiation Oncology

## 2013-01-26 ENCOUNTER — Encounter: Payer: Self-pay | Admitting: Radiation Oncology

## 2013-01-26 NOTE — Progress Notes (Signed)
[facility] Radiation Oncology End of Treatment Note  Name:[name]  Date: 01/26/2013 ZOX:[id number] DOB:[birth year]   Status:outpatient    CC: Daniel Einstein, MD  Dr. Karlyn Agee, Dr. Lucretia Roers [facility] FAX # [phone], Dr. Laroy Apple  REFERRING PHYSICIAN: Dr. 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 tolerated his treatment well with marked flattening of his carcinoma by completion of therapy.  I'm quite pleased with his response.                        PLAN: Routine followup in one month. Patient instructed to call if questions or worsening complaints in interim. He is to see Dr.Ollila at [facility] for further evaluation and scheduling of his surgery.

## 2013-01-31 ENCOUNTER — Non-Acute Institutional Stay (SKILLED_NURSING_FACILITY): Payer: Medicare Other | Admitting: Adult Health

## 2013-01-31 DIAGNOSIS — K5900 Constipation, unspecified: Secondary | ICD-10-CM

## 2013-01-31 DIAGNOSIS — I4891 Unspecified atrial fibrillation: Secondary | ICD-10-CM

## 2013-01-31 DIAGNOSIS — R3915 Urgency of urination: Secondary | ICD-10-CM

## 2013-01-31 DIAGNOSIS — I509 Heart failure, unspecified: Secondary | ICD-10-CM

## 2013-01-31 DIAGNOSIS — I10 Essential (primary) hypertension: Secondary | ICD-10-CM

## 2013-01-31 DIAGNOSIS — K219 Gastro-esophageal reflux disease without esophagitis: Secondary | ICD-10-CM

## 2013-01-31 DIAGNOSIS — I482 Chronic atrial fibrillation, unspecified: Secondary | ICD-10-CM

## 2013-01-31 DIAGNOSIS — C4A9 Merkel cell carcinoma, unspecified: Secondary | ICD-10-CM

## 2013-01-31 NOTE — Progress Notes (Signed)
Patient ID: [name], male   DOB: 1927/03/01, 79 y.o.   MRN: [id number]        PROGRESS NOTE  DATE: 01/31/13  FACILITY: Nursing Home Location: [facility]  LEVEL OF CARE: SNF (31)  Routine Visit  CHIEF COMPLAINT:  Manage congestive heart failure, BPH, atrial fibrillation, hypertension and constipation  HISTORY OF PRESENT ILLNESS:   REASSESSMENT OF ONGOING PROBLEM(S):  CHF:The patient does not relate significant weight changes, denies sob, DOE, orthopnea, PNDs, pedal edema, palpitations or chest pain.  CHF remains stable.  No complications form the medications being used.  GERD: pt's GERD is stable.  Denies ongoing heartburn, abd. Pain, nausea or vomiting.  Currently on a PPI & tolerates it without any adverse reactions.  ATRIAL FIBRILLATION: the patients atrial fibrillation remains stable.  The patient denies DOE, tachycardia, orthopnea, transient neurological sx, pedal edema, palpitations, & PNDs.  No complications noted from the medications currently being used.  PAST MEDICAL HISTORY : Reviewed.  No changes.  CURRENT MEDICATIONS: Reviewed per [facility]  REVIEW OF SYSTEMS:  GENERAL: no change in appetite, no fatigue, no weight changes, no fever, chills or weakness RESPIRATORY: no cough, SOB, DOE, wheezing, hemoptysis CARDIAC: no chest pain, edema or palpitations GI: no abdominal pain, diarrhea, constipation, heart burn, nausea or vomiting  PHYSICAL EXAMINATION  VS:  T 97.7     P 83    RR 20    BP 114/79        WT 136 (Lb)  GENERAL: no acute distress, normal body habitus NECK: supple, trachea midline, no neck masses, no thyroid tenderness, no thyromegal RESPIRATORY: breathing is even & unlabored, BS CTAB CARDIAC: RRR, no murmur,no extra heart sounds, BLE edema, 1+ GI: abdomen soft, normal BS, no masses, no tenderness, no hepatomegaly, no splenomegaly PSYCHIATRIC: the patient is alert & oriented to person, affect & behavior  appropriate  LABS/RADIOLOGY: 12/25/12 sodium 131 potassium 3.7 BUN 26 creatinine 0.8 glucose 126 calcium 9.5 albumin 4.1  [place] 1.6 7/14 BMP normal, liver profile normal, TSH 1.816, hemoglobin 12.8, MCV 90.7 otherwise CBC normal 3/14 hemoglobin 12.5, MCV 91.9 otherwise CBC normal 1/14 CMP normal   ASSESSMENT/PLAN:  Hypertension -well controlled  CHF - stable  Merkel cell carcinoma on the right hand - currently getting radiation treatment  Atrial fibrillation - rate controlled  BPH - stable  Constipation - no complaints  Urinary urgency - stable     CPT CODE: 40981

## 2013-02-04 ENCOUNTER — Other Ambulatory Visit: Payer: Self-pay | Admitting: Dermatology

## 2013-02-14 ENCOUNTER — Other Ambulatory Visit (HOSPITAL_COMMUNITY): Payer: Medicare Other

## 2013-02-15 ENCOUNTER — Ambulatory Visit (HOSPITAL_COMMUNITY): Payer: Medicare Other | Attending: Cardiology | Admitting: Radiology

## 2013-02-15 ENCOUNTER — Other Ambulatory Visit (HOSPITAL_COMMUNITY): Payer: Self-pay | Admitting: Cardiology

## 2013-02-15 DIAGNOSIS — I080 Rheumatic disorders of both mitral and aortic valves: Secondary | ICD-10-CM | POA: Insufficient documentation

## 2013-02-15 DIAGNOSIS — I359 Nonrheumatic aortic valve disorder, unspecified: Secondary | ICD-10-CM

## 2013-02-15 DIAGNOSIS — I079 Rheumatic tricuspid valve disease, unspecified: Secondary | ICD-10-CM | POA: Insufficient documentation

## 2013-02-15 DIAGNOSIS — I379 Nonrheumatic pulmonary valve disorder, unspecified: Secondary | ICD-10-CM | POA: Insufficient documentation

## 2013-02-15 DIAGNOSIS — I10 Essential (primary) hypertension: Secondary | ICD-10-CM | POA: Insufficient documentation

## 2013-02-15 DIAGNOSIS — E119 Type 2 diabetes mellitus without complications: Secondary | ICD-10-CM | POA: Insufficient documentation

## 2013-02-15 DIAGNOSIS — I509 Heart failure, unspecified: Secondary | ICD-10-CM | POA: Insufficient documentation

## 2013-02-15 DIAGNOSIS — I4891 Unspecified atrial fibrillation: Secondary | ICD-10-CM | POA: Insufficient documentation

## 2013-02-15 NOTE — Progress Notes (Signed)
Echocardiogram performed.  

## 2013-02-20 ENCOUNTER — Encounter: Payer: Self-pay | Admitting: *Deleted

## 2013-02-20 ENCOUNTER — Telehealth: Payer: Self-pay | Admitting: General Surgery

## 2013-02-20 DIAGNOSIS — I359 Nonrheumatic aortic valve disorder, unspecified: Secondary | ICD-10-CM

## 2013-02-20 DIAGNOSIS — Z923 Personal history of irradiation: Secondary | ICD-10-CM | POA: Insufficient documentation

## 2013-02-20 NOTE — Telephone Encounter (Signed)
Message copied by [name] on Wed Feb 20, 2013  2:13 PM ------      Message from: [name]      Created: Fri Feb 15, 2013  1:19 PM       Please let patient know that heart function is normal and AV stenosis is stable.  Repeat echo in 1 year ------

## 2013-02-20 NOTE — Telephone Encounter (Signed)
Order for ECHO in the computer. Sending to Scheduling to schedule ECHO for pt.

## 2013-02-26 ENCOUNTER — Ambulatory Visit
Admission: RE | Admit: 2013-02-26 | Discharge: 2013-02-26 | Disposition: A | Discharge status: Home / Self Care | Payer: Medicare Other | Source: Ambulatory Visit | Attending: Radiation Oncology | Admitting: Radiation Oncology

## 2013-02-26 ENCOUNTER — Encounter: Payer: Self-pay | Admitting: Radiation Oncology

## 2013-02-26 VITALS — BP 99/82 | HR 93 | Temp 98.4°F | Resp 20 | Wt 130.0 lb

## 2013-02-26 DIAGNOSIS — C4A9 Merkel cell carcinoma, unspecified: Secondary | ICD-10-CM

## 2013-02-26 NOTE — Progress Notes (Signed)
CC: Dr. Karlyn Agee, Dr. Gwenlyn Fudge [facility] [place] fax #[phone]), Dr. Laroy Apple  Followup note:  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 As mentioned previously, he had a favorable response. He was seen by Dr. Tama Gander at [facility], and he told the patient and family that he really do not see much to operate on. He'll be seen by again in January of 2015. The patient and family were instructed to see him sooner should they note any interval growth. The patient is asymptomatic  Physical examination: Alert and oriented. Filed Vitals:   02/26/13 1032  BP: 99/82  Pulse: 93  Temp: 98.4 F (36.9 C)  Resp: 20   On inspection the dorsum the right hand there is purplish discoloration over an area measuring 3.5 x 3.5 cm. There is slight thickening of the skin but no discrete nodularity or induration. It is difficult to determine whether not there is any residual disease. There is no palpable epitrochlear adenopathy. Neurovascular intact.  Impression: Satisfactory progress. I reminded the patient and his family that he received only a "preoperative" dose (4500 cGy) which typically is not expected to eradicate gross disease. There is no role for further radiation therapy at this point in time. He is to be followed closely. He will see Dr. Tama Gander and his PA in January return here for a followup visit in approximately 6 months.  Plan: As discussed above.

## 2013-02-26 NOTE — Progress Notes (Signed)
Pt denies pain, fatigue, loss of appetite. Skin on dorsal right hand dry, flaking; pt applies lotion. Pt saw Dr Tama Gander, [facility] and was told that the dr did not feel any nodules on his right hand.

## 2013-02-27 NOTE — Telephone Encounter (Signed)
Echo recall 02/2014

## 2013-03-13 ENCOUNTER — Non-Acute Institutional Stay (SKILLED_NURSING_FACILITY): Payer: Medicare Other | Admitting: Internal Medicine

## 2013-03-13 DIAGNOSIS — I482 Chronic atrial fibrillation, unspecified: Secondary | ICD-10-CM

## 2013-03-13 DIAGNOSIS — N400 Enlarged prostate without lower urinary tract symptoms: Secondary | ICD-10-CM

## 2013-03-13 DIAGNOSIS — I5031 Acute diastolic (congestive) heart failure: Secondary | ICD-10-CM

## 2013-03-13 DIAGNOSIS — I509 Heart failure, unspecified: Secondary | ICD-10-CM

## 2013-03-13 DIAGNOSIS — K5900 Constipation, unspecified: Secondary | ICD-10-CM

## 2013-03-13 DIAGNOSIS — I4891 Unspecified atrial fibrillation: Secondary | ICD-10-CM

## 2013-03-13 NOTE — Progress Notes (Signed)
PROGRESS NOTE  DATE: 03/13/13  FACILITY: [facility]  LEVEL OF CARE: SNF  Routine Visit  CHIEF COMPLAINT:  Manage CHF and atrial fibrillation  HISTORY OF PRESENT ILLNESS:  REASSESSMENT OF ONGOING PROBLEMS:  CHF:The patient does not relate significant weight changes, denies sob, DOE, orthopnea, PNDs, palpitations or chest pain.  CHF remains stable.  No complications form the medications being used.  Complains of mild lower extremity swelling.  ATRIAL FIBRILLATION: the patients a-fib remains stable.  The patient denies DOE, tachycardia, orthopnea, transient neurological sx, palpitations, & PNDs.  No complications noted from the medications currently being used.  PAST MEDICAL HISTORY : Reviewed.  No changes.  CURRENT MEDICATIONS: Reviewed per [facility]  REVIEW OF SYSTEMS:  GENERAL: no change in appetite, no fatigue, no weight changes, no fever, chills or weakness RESPIRATORY: no cough, SOB, DOE,, wheezing, hemoptysis CARDIAC: no chest pain, or palpitations.  C/o LE swelling. GI: no abdominal pain, diarrhea, constipation, heart burn, nausea or vomiting  PHYSICAL EXAMINATION  VS:  T 97.4    P 82     BP  160/80    Pox  97%  GENERAL: no acute distress, normal body habitus NECK: supple, trachea midline, no neck masses, no thyroid tenderness, no thyromegaly RESPIRATORY: breathing is even & unlabored, BS CTAB CARDIAC: RRR, no murmur,no extra heart sounds, +2 bilateral lower extremity edema GI: abdomen soft, normal BS, no masses, no tenderness, no hepatomegaly, no splenomegaly PSYCHIATRIC: the patient is alert & oriented to person, affect & behavior appropriate  LABS/RADIOLOGY:  10-14 hemoglobin 12.5, MCV 94.9 otherwise CBC normal, chloride 94, CO2 32, glucose 106 otherwise BMP normal  7/14 BMP normal, liver profile normal, TSH 1.816, hemoglobin 12.8, MCV 90.7 otherwise CBC normal  3/14 hemoglobin 12.5, MCV 91.9 otherwise CBC normal 1/14 CMP  normal  ASSESSMENT/PLAN:  CHF-well-controlled. atrial fibrillation-rate controlled. BPH-denies symptoms. constipation-well-controlled. anemia of chronic disease-stable. GERD-well-controlled. Elevated blood pressure-will review a log  CPT CODE: 16109

## 2013-04-10 ENCOUNTER — Encounter: Payer: Self-pay | Admitting: Internal Medicine

## 2013-04-10 ENCOUNTER — Non-Acute Institutional Stay (SKILLED_NURSING_FACILITY): Payer: Medicare Other | Admitting: Internal Medicine

## 2013-04-10 DIAGNOSIS — I482 Chronic atrial fibrillation, unspecified: Secondary | ICD-10-CM

## 2013-04-10 DIAGNOSIS — I4891 Unspecified atrial fibrillation: Secondary | ICD-10-CM

## 2013-04-10 DIAGNOSIS — K5900 Constipation, unspecified: Secondary | ICD-10-CM

## 2013-04-10 DIAGNOSIS — N400 Enlarged prostate without lower urinary tract symptoms: Secondary | ICD-10-CM

## 2013-04-10 DIAGNOSIS — I509 Heart failure, unspecified: Secondary | ICD-10-CM

## 2013-04-10 NOTE — Progress Notes (Signed)
PROGRESS NOTE  DATE: 04/10/13  FACILITY: [facility]  LEVEL OF CARE: SNF  Routine Visit  CHIEF COMPLAINT:  Manage CHF and atrial fibrillation  HISTORY OF PRESENT ILLNESS:  REASSESSMENT OF ONGOING PROBLEMS:  CHF:The patient does not relate significant weight changes, denies sob, DOE, orthopnea, PNDs, palpitations or chest pain.  CHF remains stable.  No complications form the medications being used.  Complains of mild lower extremity swelling.  ATRIAL FIBRILLATION: the patients a-fib remains stable.  The patient denies DOE, tachycardia, orthopnea, transient neurological sx, palpitations, & PNDs.  No complications noted from the medications currently being used.  PAST MEDICAL HISTORY : Reviewed.  No changes.  CURRENT MEDICATIONS: Reviewed per [facility]  REVIEW OF SYSTEMS:  GENERAL: no change in appetite, no fatigue, no weight changes, no fever, chills or weakness RESPIRATORY: no cough, SOB, DOE,, wheezing, hemoptysis CARDIAC: no chest pain, or palpitations.  C/o LE swelling. GI: no abdominal pain, diarrhea, constipation, heart burn, nausea or vomiting  PHYSICAL EXAMINATION  VS:  T 97.5    P 76     RR 16     BP  132/85    Pox  95%  GENERAL: no acute distress, normal body habitus NECK: supple, trachea midline, no neck masses, no thyroid tenderness, no thyromegaly RESPIRATORY: breathing is even & unlabored, BS CTAB CARDIAC: RRR, no murmur,no extra heart sounds, +2 bilateral lower extremity edema GI: abdomen soft, normal BS, no masses, no tenderness, no hepatomegaly, no splenomegaly PSYCHIATRIC: the patient is alert & oriented to person, affect & behavior appropriate  LABS/RADIOLOGY:  10-14 hemoglobin 12.5, MCV 94.9 otherwise CBC normal, chloride 94, CO2 32, glucose 106 otherwise BMP normal  7/14 BMP normal, liver profile normal, TSH 1.816, hemoglobin 12.8, MCV 90.7 otherwise CBC normal  3/14 hemoglobin 12.5, MCV 91.9 otherwise CBC normal 1/14 CMP  normal  ASSESSMENT/PLAN:  CHF-well-controlled. atrial fibrillation-rate controlled. BPH-denies symptoms. constipation-well-controlled. anemia of chronic disease-stable. GERD-well-controlled.  CPT CODE: 40981

## 2013-04-29 ENCOUNTER — Other Ambulatory Visit: Payer: Self-pay | Admitting: *Deleted

## 2013-04-29 MED ORDER — CEFTRIAXONE SODIUM 1 G IJ SOLR: INTRAMUSCULAR | Status: DC

## 2013-06-04 ENCOUNTER — Non-Acute Institutional Stay (SKILLED_NURSING_FACILITY): Payer: Medicare Other | Admitting: Adult Health

## 2013-06-04 DIAGNOSIS — I509 Heart failure, unspecified: Secondary | ICD-10-CM

## 2013-06-04 DIAGNOSIS — K219 Gastro-esophageal reflux disease without esophagitis: Secondary | ICD-10-CM

## 2013-06-04 DIAGNOSIS — I10 Essential (primary) hypertension: Secondary | ICD-10-CM

## 2013-06-04 DIAGNOSIS — K5900 Constipation, unspecified: Secondary | ICD-10-CM

## 2013-06-04 DIAGNOSIS — L03116 Cellulitis of left lower limb: Secondary | ICD-10-CM

## 2013-06-04 DIAGNOSIS — I482 Chronic atrial fibrillation, unspecified: Secondary | ICD-10-CM

## 2013-06-04 DIAGNOSIS — I4891 Unspecified atrial fibrillation: Secondary | ICD-10-CM

## 2013-06-04 DIAGNOSIS — N400 Enlarged prostate without lower urinary tract symptoms: Secondary | ICD-10-CM

## 2013-06-04 DIAGNOSIS — L02619 Cutaneous abscess of unspecified foot: Secondary | ICD-10-CM

## 2013-06-04 DIAGNOSIS — L03119 Cellulitis of unspecified part of limb: Secondary | ICD-10-CM

## 2013-06-04 DIAGNOSIS — IMO0002 Reserved for concepts with insufficient information to code with codable children: Secondary | ICD-10-CM

## 2013-06-13 DIAGNOSIS — R627 Adult failure to thrive: Secondary | ICD-10-CM | POA: Insufficient documentation

## 2013-06-13 DIAGNOSIS — L03116 Cellulitis of left lower limb: Secondary | ICD-10-CM | POA: Insufficient documentation

## 2013-06-13 NOTE — Progress Notes (Signed)
Patient ID: [name], male   DOB: 10-14-1926, 80 y.o.   MRN: [id number]         PROGRESS NOTE  DATE:  06/04/13  FACILITY:  [facility]  LEVEL OF CARE: SNF  Routine Visit  CHIEF COMPLAINT:  Manage CHF, BPH and atrial fibrillation  HISTORY OF PRESENT ILLNESS: This is an 80 year old male who has bilateral plantar foot wound. Noted left plantar wound to have increased drainage with foul odor and increased in size.  REASSESSMENT OF ONGOING PROBLEMS:  HTN: Pt 's HTN remains stable.  Denies CP, sob, DOE, pedal edema, headaches, dizziness or visual disturbances.  No complications from the medications currently being used.  Last BP : 113/71  BPH: The patient's BPH remains stable. Patient denies urinary hesitancy or dribbling. No complications reported from the current medications being used.  ATRIAL FIBRILLATION: the patients a-fib remains stable.  The patient denies DOE, tachycardia, orthopnea, transient neurological sx, palpitations, & PNDs.  No complications noted from the medications currently being used.  PAST MEDICAL HISTORY : Reviewed.  No changes.  CURRENT MEDICATIONS: Reviewed per [facility]  REVIEW OF SYSTEMS:  GENERAL: no change in appetite, no fatigue, no weight changes, no fever, chills or weakness RESPIRATORY: no cough, SOB, DOE,, wheezing, hemoptysis CARDIAC: no chest pain, or palpitations.  C/o LE swelling. GI: no abdominal pain, diarrhea, constipation, heart burn, nausea or vomiting  PHYSICAL EXAMINATION  VS:  T 97.5    P 76     RR20    BP  113/71    Pox  95% WT 127 lbs - 5.3% over 11 days, 7.16% X 85          days and 11.44% x 180 days  GENERAL: no acute distress, normal body habitus EYES: PERRL, conj clear, no exudate NECK: supple, trachea midline, no neck masses, no thyroid tenderness, no thyromegaly RESPIRATORY: breathing is even & unlabored, BS CTAB CARDIAC: RRR, no murmur,no extra heart sounds, +2 bilateral lower extremity edema GI: abdomen soft, normal BS,  no masses, no tenderness, no hepatomegaly, no splenomegaly PSYCHIATRIC: the patient is alert & oriented to person, affect & behavior appropriate  LABS/RADIOLOGY: 05/07/13 sodium 1:30 potassium 4.8 glucose 106 BUN 24 creatinine 0.7 calcium 8.8 WBC 7.1 hemoglobin 11.5 hematocrit 35.3 04/12/13 sodium 132 potassium 3.9 glucose 127 BUN 23 creatinine 0.8 calcium 8.7 WBC 8.8 hemoglobin 10.0 hematocrit 40.2 10-14 hemoglobin 12.5, MCV 94.9 otherwise CBC normal, chloride 94, CO2 32, glucose 106 otherwise BMP normal 7/14 BMP normal, liver profile normal, TSH 1.816, hemoglobin 12.8, MCV 90.7 otherwise CBC normal 3/14 hemoglobin 12.5, MCV 91.9 otherwise CBC normal 1/14 CMP normal  ASSESSMENT/PLAN:  CHF - stable; continue Lasix atrial fibrillation-rate controlled, continue Coreg. BPH-denies symptoms, continue Avodart constipation-well-controlled. GERD-well-controlled; continue Protonix Hypertension - well-controlled; continue Cozaar. Cellulitis of left foot - start Doxycycline 100 mg PO BID x 14 days Failure to thrive - palliative consult for supportive care   CPT CODE: 84132

## 2013-07-03 ENCOUNTER — Encounter: Payer: Self-pay | Admitting: Adult Health

## 2013-07-03 ENCOUNTER — Non-Acute Institutional Stay (SKILLED_NURSING_FACILITY): Payer: Medicare Other | Admitting: Adult Health

## 2013-07-03 DIAGNOSIS — I509 Heart failure, unspecified: Secondary | ICD-10-CM

## 2013-07-03 DIAGNOSIS — K5900 Constipation, unspecified: Secondary | ICD-10-CM

## 2013-07-03 DIAGNOSIS — I10 Essential (primary) hypertension: Secondary | ICD-10-CM

## 2013-07-03 DIAGNOSIS — I4891 Unspecified atrial fibrillation: Secondary | ICD-10-CM

## 2013-07-03 DIAGNOSIS — IMO0002 Reserved for concepts with insufficient information to code with codable children: Secondary | ICD-10-CM

## 2013-07-03 DIAGNOSIS — K219 Gastro-esophageal reflux disease without esophagitis: Secondary | ICD-10-CM

## 2013-07-03 DIAGNOSIS — C4A9 Merkel cell carcinoma, unspecified: Secondary | ICD-10-CM

## 2013-07-03 DIAGNOSIS — I482 Chronic atrial fibrillation, unspecified: Secondary | ICD-10-CM

## 2013-07-03 DIAGNOSIS — E871 Hypo-osmolality and hyponatremia: Secondary | ICD-10-CM

## 2013-07-03 DIAGNOSIS — N400 Enlarged prostate without lower urinary tract symptoms: Secondary | ICD-10-CM

## 2013-07-03 NOTE — Progress Notes (Addendum)
Patient ID: [name], male   DOB: December 07, 1926, 80 y.o.   MRN: [id number]          PROGRESS NOTE  DATE:  07/03/13  FACILITY:  [facility]  LEVEL OF CARE: SNF  Routine Visit  CHIEF COMPLAINT:  Manage CHF, BPH and atrial fibrillation  HISTORY OF PRESENT ILLNESS:   REASSESSMENT OF ONGOING PROBLEMS:  HTN: Pt 's HTN remains stable.  Denies CP, sob, DOE, pedal edema, headaches, dizziness or visual disturbances.  No complications from the medications currently being used.  Last BP : 108/61  CHF:The patient does not relate significant weight changes, denies sob, DOE, orthopnea, PNDs, pedal edema, palpitations or chest pain.  CHF remains stable.  No complications form the medications being used.  ATRIAL FIBRILLATION: the patients a-fib remains stable.  The patient denies DOE, tachycardia, orthopnea, transient neurological sx, palpitations, & PNDs.  No complications noted from the medications currently being used.  PAST MEDICAL HISTORY : Reviewed.  No changes.  CURRENT MEDICATIONS: Reviewed per [facility]  REVIEW OF SYSTEMS:  GENERAL: no change in appetite, no fatigue, no weight changes, no fever, chills or weakness RESPIRATORY: no cough, SOB, DOE,, wheezing, hemoptysis CARDIAC: no chest pain, or palpitations.  C/o LE swelling. GI: no abdominal pain, diarrhea, constipation, heart burn, nausea or vomiting  PHYSICAL EXAMINATION  GENERAL: no acute distress, normal body habitus NECK: supple, trachea midline, no neck masses, no thyroid tenderness, no thyromegaly RESPIRATORY: breathing is even & unlabored, BS CTAB CARDIAC: irregularly irregular heart rate, no murmur,no extra heart sounds, +2 bilateral lower extremity edema GI: abdomen soft, normal BS, no masses, no tenderness, no hepatomegaly, no splenomegaly PSYCHIATRIC: the patient is alert & oriented to person, affect & behavior appropriate  LABS/RADIOLOGY: 06/05/13 NA131   K4.5   glucose138   BUN19   Creatinine0.7   CA9.0 05/07/13  sodium 1:30 potassium 4.8 glucose 106 BUN 24 creatinine 0.7 calcium 8.8 WBC 7.1 hemoglobin 11.5 hematocrit 35.3 04/12/13 sodium 132 potassium 3.9 glucose 127 BUN 23 creatinine 0.8 calcium 8.7 WBC 8.8 hemoglobin 10.0 hematocrit 40.2 10-14 hemoglobin 12.5, MCV 94.9 otherwise CBC normal, chloride 94, CO2 32, glucose 106 otherwise BMP normal 7/14 BMP normal, liver profile normal, TSH 1.816, hemoglobin 12.8, MCV 90.7 otherwise CBC normal 3/14 hemoglobin 12.5, MCV 91.9 otherwise CBC normal 1/14 CMP normal  ASSESSMENT/PLAN:  CHF - stable atrial fibrillation-rate controlledBPH-denies symptoms constipation-no complaints GERD-well-controlled Hypertension - well-controlled Failure to thrive - hospice/comfort care Merkel's Cell Cancer - hospice/comfort care; he wants to be full code at this time Hyponatremia - stable   CPT CODE: 15176  Akash Winski Vargas - NP [facility] [phone]

## 2013-07-16 ENCOUNTER — Encounter: Payer: Self-pay | Admitting: Radiation Oncology

## 2013-07-16 ENCOUNTER — Encounter: Payer: Self-pay | Admitting: *Deleted

## 2013-07-16 ENCOUNTER — Ambulatory Visit
Admission: RE | Admit: 2013-07-16 | Discharge: 2013-07-16 | Disposition: A | Discharge status: Home / Self Care | Payer: Medicare Other | Source: Ambulatory Visit | Attending: Radiation Oncology | Admitting: Radiation Oncology

## 2013-07-16 VITALS — BP 98/69 | HR 85 | Temp 98.3°F | Resp 20

## 2013-07-16 DIAGNOSIS — C4A9 Merkel cell carcinoma, unspecified: Secondary | ICD-10-CM

## 2013-07-16 NOTE — Progress Notes (Signed)
[facility] Follow up Note  Name: [name]   Date:   07/16/2013 MRN:  [id number] DOB: 1927-04-23   CC:  Ezequiel Kayser, MD  Bonnita Levan, MD Dr. 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 ALPRAZolam (XANAX) 0.25 MG tablet Take one tablet by mouth twice daily as needed for anxiety  60 tablet  5  . calcium carbonate (TUMS - DOSED IN MG ELEMENTAL CALCIUM) 500 MG chewable tablet Chew 2 tablets by mouth every 8 (eight) hours as needed for indigestion or heartburn.      . Calcium Carbonate-Vitamin D (CALCIUM 500 + D PO) Take 1 tablet by mouth daily.      . carvedilol (COREG) 6.25 MG tablet Take 6.25 mg by mouth 2 (two) times daily with a meal.      . cefTRIAXone (ROCEPHIN) 1 G injection Administer 1 Gram IV in 62ml normal saline daily. Run over 15 minutes  30 each  0  . dutasteride (AVODART) 0.5 MG capsule Take 0.5 mg by mouth daily.      . fish oil-omega-3 fatty acids 1000 MG capsule Take 2 g by mouth daily.      . furosemide (LASIX) 20 MG tablet Take 20 mg by mouth 2 (two) times daily.       Marland Kitchen HYDROcodone-acetaminophen (NORCO/VICODIN) 5-325 MG per tablet Take 1 tablet by mouth every 4 (four) hours as needed.      . hydrOXYzine (ATARAX/VISTARIL) 10 MG tablet Take 10 mg by mouth at bedtime as needed.       Marland Kitchen losartan (COZAAR) 25 MG tablet Take 25 mg by mouth daily.      . Multiple Vitamins-Minerals (DECUBI-VITE PO) Take 1 tablet by mouth daily.      . multivitamin-lutein (OCUVITE-LUTEIN) CAPS Take 1 capsule by mouth daily.      . nitroGLYCERIN (NITROSTAT) 0.4 MG SL tablet Place 0.4 mg under the tongue every 5 (five) minutes as needed. For chest pain      . oxybutynin (DITROPAN) 5 MG tablet Take 5 mg by mouth at  bedtime. Take 2 tabs = 10 mg      . pantoprazole (PROTONIX) 40 MG tablet Take 40 mg by mouth daily.      . polyethylene glycol (MIRALAX / GLYCOLAX) packet Take 17 g by mouth daily.      Marland Kitchen Propylene Glycol (SYSTANE BALANCE OP) Place 1 drop into both eyes 2 (two) times daily.      . Protein (PROCEL) POWD Take 2 scoop by mouth 2 (two) times daily.      . sodium chloride (SALINE MIST) 0.65 % nasal spray Place 2 sprays into the nose as needed for congestion.      . traMADol (ULTRAM) 50 MG tablet Take 50 mg by mouth every 6 (six) hours as needed for pain.       No current facility-administered medications for this encounter.     NARRATIVE: 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 He was to undergo surgery, but he had a complete response, and he is simply being followed. He continues to do well. He was seen by his  [facility] family nurse practitioner, Mora Appl,  on 05/28/2013. He was felt to be in complete remission.   PHYSICAL EXAM:   oral temperature is 98.3 F (36.8 C). His blood pressure is 98/69 and his pulse is 85. His respiration is 20.  Alert and oriented. There is no palpable epitrochlear or axillary lymphadenopathy. On inspection of the right hand there is residual hyperpigmentation/erythema along the tumor bed overlying the right fourth and fifth metacarpals. There is no palpable evidence for persistent/recurrent disease.   LABORATORY DATA:  Lab Results  Component Value Date   WBC 6.5 11/24/2011   HGB 12.9* 11/08/2012   HCT 38.0* 11/08/2012   MCV 85.4 11/24/2011   PLT 257 11/24/2011   Lab Results  Component Value Date   NA 131* 11/08/2012   K 4.0 11/08/2012   CL 92* 11/08/2012   CO2 28 11/27/2011   Lab Results  Component Value Date   ALT 21 11/01/2011   AST 18 11/01/2011   ALKPHOS 71 11/01/2011   BILITOT 0.5 11/01/2011      IMPRESSION:  Satisfactory progress with no evidence for persistent/recurrent disease along the dorsum of his right hand.    PLAN: Followup visit with Dr. Freddi Che in July, and followup visit with me in November 2015.   I spent 15 minutes minutes face to face with the patient and more than 50% of that time was spent in counseling and/or coordination of care.

## 2013-07-16 NOTE — Progress Notes (Signed)
Pt denies pain, loss of appetite. He continues to reside at [facility]. He saw Dr Leisa Lenz, [facility] in Jan. His caregiver states she massages his right hand frequently.

## 2013-07-26 ENCOUNTER — Encounter: Payer: Self-pay | Admitting: General Surgery

## 2013-07-31 ENCOUNTER — Encounter: Payer: Self-pay | Admitting: Cardiology

## 2013-07-31 ENCOUNTER — Ambulatory Visit (INDEPENDENT_AMBULATORY_CARE_PROVIDER_SITE_OTHER): Payer: Medicare Other | Admitting: Cardiology

## 2013-07-31 VITALS — BP 110/88 | HR 80 | Ht 70.0 in | Wt 125.0 lb

## 2013-07-31 DIAGNOSIS — I4891 Unspecified atrial fibrillation: Secondary | ICD-10-CM

## 2013-07-31 DIAGNOSIS — R600 Localized edema: Secondary | ICD-10-CM

## 2013-07-31 DIAGNOSIS — R609 Edema, unspecified: Secondary | ICD-10-CM

## 2013-07-31 DIAGNOSIS — I359 Nonrheumatic aortic valve disorder, unspecified: Secondary | ICD-10-CM

## 2013-07-31 DIAGNOSIS — I5032 Chronic diastolic (congestive) heart failure: Secondary | ICD-10-CM

## 2013-07-31 DIAGNOSIS — I350 Nonrheumatic aortic (valve) stenosis: Secondary | ICD-10-CM

## 2013-07-31 DIAGNOSIS — I509 Heart failure, unspecified: Secondary | ICD-10-CM

## 2013-07-31 DIAGNOSIS — I10 Essential (primary) hypertension: Secondary | ICD-10-CM

## 2013-07-31 DIAGNOSIS — I482 Chronic atrial fibrillation, unspecified: Secondary | ICD-10-CM

## 2013-07-31 NOTE — Patient Instructions (Signed)
Your physician recommends that you continue on your current medications as directed. Please refer to the Current Medication list given to you today.  Your physician wants you to follow-up in: 6 months with Dr Turner You will receive a reminder letter in the mail two months in advance. If you don't receive a letter, please call our office to schedule the follow-up appointment.  

## 2013-07-31 NOTE — Progress Notes (Signed)
[place], [place], [place]  [address] Phone: [phone] Fax:  [phone]  Date:  07/31/2013   ID:  Brance Dartt, DOB 06/30/1926, MRN [id number]  PCP:  Ezequiel Kayser, MD  Cardiologist:  Fransico Him, MD     History of Present Illness: [name] is a 80 y.o. male with a history of chronic atrial fibrillation, moderate AS, chronic diastolic CHF, moderate MR who presents today for followup.  He is doing well.  He denies any chest pain, SOB, DOE, LE edema, dizziness, palpitations or syncope.   Wt Readings from Last 3 Encounters:  07/31/13 125 lb (56.7 kg)  07/03/13 125 lb 3.2 oz (56.79 kg)  02/26/13 130 lb (58.968 kg)     Past Medical History  Diagnosis Date  . Cellulitis   . GERD (gastroesophageal reflux disease)   . Hypokalemia   . Hypertension   . CHF (congestive heart failure)   . Ankle deformity     broke right ankle and it "was set" no surgery  . Shortness of breath   . Atrial fibrillation     not on coumadin due to intracranial bleed from a fall while on coumadin  . Pneumonia   . History of bronchitis     "when I was young"  . H/O: whooping cough   . Arthritis     "hands"  . Melanoma of back   . Anxiety   . Wears glasses   . Diabetes mellitus 11/24/11    "I'm at the very beginning; not treated yet"  . Actinic skin damage   . Lentigines   . Seborrheic keratoses   . Cherry angioma   . Multiple benign nevi   . Asteatotic dermatitis   . Skin cancer 10/2012    merkel cell, right hand  . Macular degeneration, left eye   . Merkel cell cancer 10/2012    right hand  . History of radiation therapy 12/18/12- 02/07/13    dorsum right hand 4500 cGy 25 sessions  . Anemia   . CHF (congestive heart failure)   . Gait abnormality   . Recurrent falls   . Constipation   . Hiatal hernia   . Moderate aortic stenosis   . Hyponatremia 7/13    followed by Dr Posey Pronto, with noted also inappropriate ADHD secretion    Current Outpatient  Prescriptions  Medication Sig Dispense Refill  . ALPRAZolam (XANAX) 0.25 MG tablet Take one tablet by mouth twice daily as needed for anxiety  60 tablet  5  . calcium carbonate (TUMS - DOSED IN MG ELEMENTAL CALCIUM) 500 MG chewable tablet Chew 2 tablets by mouth every 8 (eight) hours as needed for indigestion or heartburn.      . Calcium Carbonate-Vitamin D (CALCIUM 500 + D PO) Take 1 tablet by mouth daily. For supplement      . carvedilol (COREG) 6.25 MG tablet Take 6.25 mg by mouth 2 (two) times daily with a meal. For A-Fib      . cefTRIAXone (ROCEPHIN) 1 G injection Administer 1 Gram IV in 65ml normal saline daily. Run over 15 minutes  30 each  0  . dutasteride (AVODART) 0.5 MG capsule Take 0.5 mg by mouth daily. For BPH      . fish oil-omega-3 fatty acids 1000 MG capsule Take 2 g by mouth daily.      . furosemide (LASIX) 20 MG tablet Take 20 mg by mouth 2 (two) times daily. For HTN/ Edema      .  HYDROcodone-acetaminophen (NORCO/VICODIN) 5-325 MG per tablet Take 1 tablet by mouth every 4 (four) hours as needed. For severe to moderate pain.      . hydrOXYzine (ATARAX/VISTARIL) 10 MG tablet Take 10 mg by mouth at bedtime as needed. For puritis      . losartan (COZAAR) 25 MG tablet Take 25 mg by mouth daily. For HTN      . Multiple Vitamins-Minerals (DECUBI-VITE PO) Take 1 tablet by mouth daily. For wound healing      . multivitamin-lutein (OCUVITE-LUTEIN) CAPS Take 1 capsule by mouth daily.      . nitroGLYCERIN (NITROSTAT) 0.4 MG SL tablet Place 0.4 mg under the tongue every 5 (five) minutes as needed. For chest pain      . oxybutynin (DITROPAN) 5 MG tablet Take 5 mg by mouth at bedtime. Take 2 tabs = 10 mg. For urinary urgency      . pantoprazole (PROTONIX) 40 MG tablet Take 40 mg by mouth daily.      . polyethylene glycol (MIRALAX / GLYCOLAX) packet Take 17 g by mouth daily. For constipation      . Propylene Glycol (SYSTANE BALANCE OP) Place 1 drop into both eyes 2 (two) times daily. For dry  eyes      . Protein (PROCEL) POWD Take 2 scoop by mouth 2 (two) times daily.      . sodium chloride (SALINE MIST) 0.65 % nasal spray Place 2 sprays into the nose as needed for congestion.      . traMADol (ULTRAM) 50 MG tablet Take 50 mg by mouth every 6 (six) hours as needed for pain.       No current facility-administered medications for this visit.    Allergies:    Allergies  Allergen Reactions  . Tape Other (See Comments)    "I do better w/paper tape"    Social History:  The patient  reports that he has quit smoking. He has never used smokeless tobacco. He reports that he drinks alcohol. He reports that he does not use illicit drugs.   Family History:  The patient's family history includes Arthritis in his mother; CAD in his mother; Cancer in his cousin and maternal aunt; Heart attack in his father; Heart disease in his mother.   ROS:  Please see the history of present illness.      All other systems reviewed and negative.   PHYSICAL EXAM: VS:  BP 110/88  Pulse 80  Ht 5\' 10"  (1.778 m)  Wt 125 lb (56.7 kg)  BMI 17.94 kg/m2 Well nourished, well developed, in no acute distress HEENT: normal Neck: no JVD Cardiac:  normal S1, S2; irregularly irregular; no murmur Lungs:  clear to auscultation bilaterally, no wheezing, rhonchi or rales Abd: soft, nontender, no hepatomegaly Ext: chronic venous stasis changes with trace edema Skin: warm and dry Neuro:  CNs 2-12 intact, no focal abnormalities noted       ASSESSMENT AND PLAN:  1. Moderate AS 2. Moderate MR 3. Chronic atrial fibrillation not on systemic anticoagulation due to intracranial bleed.  Rate controlled - continue beta blocker 4. Chronic diastolic CHF - continue ARB/lasix/beta blocker   Followup with me in 6  months  Signed, Fransico Him, MD 07/31/2013 1:46 PM

## 2013-08-30 ENCOUNTER — Encounter: Payer: Self-pay | Admitting: Adult Health

## 2013-08-30 ENCOUNTER — Non-Acute Institutional Stay (SKILLED_NURSING_FACILITY): Payer: Medicare Other | Admitting: Adult Health

## 2013-08-30 DIAGNOSIS — IMO0002 Reserved for concepts with insufficient information to code with codable children: Secondary | ICD-10-CM

## 2013-08-30 DIAGNOSIS — N400 Enlarged prostate without lower urinary tract symptoms: Secondary | ICD-10-CM

## 2013-08-30 DIAGNOSIS — C4A9 Merkel cell carcinoma, unspecified: Secondary | ICD-10-CM

## 2013-08-30 DIAGNOSIS — E871 Hypo-osmolality and hyponatremia: Secondary | ICD-10-CM

## 2013-08-30 DIAGNOSIS — I4891 Unspecified atrial fibrillation: Secondary | ICD-10-CM

## 2013-08-30 DIAGNOSIS — K5900 Constipation, unspecified: Secondary | ICD-10-CM

## 2013-08-30 DIAGNOSIS — I5032 Chronic diastolic (congestive) heart failure: Secondary | ICD-10-CM

## 2013-08-30 DIAGNOSIS — I482 Chronic atrial fibrillation, unspecified: Secondary | ICD-10-CM

## 2013-08-30 DIAGNOSIS — R3915 Urgency of urination: Secondary | ICD-10-CM

## 2013-08-30 DIAGNOSIS — K219 Gastro-esophageal reflux disease without esophagitis: Secondary | ICD-10-CM

## 2013-08-30 DIAGNOSIS — I509 Heart failure, unspecified: Secondary | ICD-10-CM

## 2013-08-30 NOTE — Progress Notes (Signed)
Patient ID: Marguerita Beards, male   DOB: [birth year], 80 y.o.   MRN: [id number]          PROGRESS NOTE  DATE:  08/30/13  FACILITY:  [facility]  LEVEL OF CARE: SNF  Routine Visit  CHIEF COMPLAINT:  Manage CHF, BPH and atrial fibrillation  HISTORY OF PRESENT ILLNESS:   REASSESSMENT OF ONGOING PROBLEMS:  CHF:The patient does not relate significant weight changes, denies sob, DOE, orthopnea, PNDs, pedal edema, palpitations or chest pain.  CHF remains stable.  No complications form the medications being used.  BPH: The patient's BPH remains stable. Patient denies urinary hesitancy or dribbling. No complications reported from the current medications being used.  GERD: pt's GERD is stable.  Denies ongoing heartburn, abd. Pain, nausea or vomiting.  Currently on a PPI & tolerates it without any adverse reactions.  ATRIAL FIBRILLATION: the patients a-fib remains stable.  The patient denies DOE, tachycardia, orthopnea, transient neurological sx, palpitations, & PNDs.  No complications noted from the medications currently being used.  PAST MEDICAL HISTORY : Reviewed.  No changes.  CURRENT MEDICATIONS: Reviewed per [facility]  REVIEW OF SYSTEMS:  GENERAL: no change in appetite, no fatigue, no weight changes, no fever, chills or weakness RESPIRATORY: no cough, SOB, DOE, wheezing, hemoptysis CARDIAC: no chest pain, or palpitations.  +edema GI: no abdominal pain, diarrhea, constipation, heart burn, nausea or vomiting  PHYSICAL EXAMINATION  GENERAL: no acute distress, normal body habitus NECK: supple, trachea midline, no thyroid tenderness, no thyromegaly RESPIRATORY: breathing is even & unlabored, BS CTAB CARDIAC: irregularly irregular heart rate, no murmur,no extra heart sounds, +2 bilateral lower extremity edema GI: abdomen soft, normal BS, no masses, no tenderness, no hepatomegaly, no splenomegaly EXTREMITIES: able to move all 4 extremities; uses wheelchair to move around; able to transfer  self PSYCHIATRIC: the patient is alert & oriented to person, affect & behavior appropriate  LABS/RADIOLOGY: 07/16/13 sodium 131 potassium 4.1 glucose 98 BUN 28 creatinine 0.9 calcium 8.9 06/05/13 NA131   K4.5   glucose138   BUN19   Creatinine0.7   CA9.0 05/07/13 sodium 1:30 potassium 4.8 glucose 106 BUN 24 creatinine 0.7 calcium 8.8 WBC 7.1 hemoglobin 11.5 hematocrit 35.3 04/12/13 sodium 132 potassium 3.9 glucose 127 BUN 23 creatinine 0.8 calcium 8.7 WBC 8.8 hemoglobin 10.0 hematocrit 40.2 10-14 hemoglobin 12.5, MCV 94.9 otherwise CBC normal, chloride 94, CO2 32, glucose 106 otherwise BMP normal 7/14 BMP normal, liver profile normal, TSH 1.816, hemoglobin 12.8, MCV 90.7 otherwise CBC normal 3/14 hemoglobin 12.5, MCV 91.9 otherwise CBC normal 1/14 CMP normal  ASSESSMENT/PLAN:  Chronic diastolic CHF - stable; continue Lasix and losartan Chronic atrial fibrillation - rate controlled BPH - continue Avodart Constipation - no complaints; continue Miralax GERD-well-controlled; continue Protonix Failure to thrive - hospice/comfort care Merkel's Cell Cancer - hospice/comfort care; he wants to be full code at this time Hyponatremia - stable Urinary urgency - continue oxybutynin  CPT CODE: 48185  Kewon Statler Vargas - NP [facility] [phone]

## 2013-09-20 ENCOUNTER — Encounter: Payer: Self-pay | Admitting: *Deleted

## 2013-10-03 ENCOUNTER — Non-Acute Institutional Stay (SKILLED_NURSING_FACILITY): Payer: Medicare Other | Admitting: Adult Health

## 2013-10-03 ENCOUNTER — Encounter: Payer: Self-pay | Admitting: Adult Health

## 2013-10-03 DIAGNOSIS — R3915 Urgency of urination: Secondary | ICD-10-CM

## 2013-10-03 DIAGNOSIS — I509 Heart failure, unspecified: Secondary | ICD-10-CM

## 2013-10-03 DIAGNOSIS — C4A9 Merkel cell carcinoma, unspecified: Secondary | ICD-10-CM

## 2013-10-03 DIAGNOSIS — N400 Enlarged prostate without lower urinary tract symptoms: Secondary | ICD-10-CM

## 2013-10-03 DIAGNOSIS — K219 Gastro-esophageal reflux disease without esophagitis: Secondary | ICD-10-CM

## 2013-10-03 DIAGNOSIS — I5032 Chronic diastolic (congestive) heart failure: Secondary | ICD-10-CM

## 2013-10-03 DIAGNOSIS — E871 Hypo-osmolality and hyponatremia: Secondary | ICD-10-CM

## 2013-10-03 DIAGNOSIS — K5900 Constipation, unspecified: Secondary | ICD-10-CM

## 2013-10-03 DIAGNOSIS — IMO0002 Reserved for concepts with insufficient information to code with codable children: Secondary | ICD-10-CM

## 2013-10-03 NOTE — Progress Notes (Signed)
Patient ID: Marguerita Beards, male   DOB: [birth year], 80 y.o.   MRN: [id number]          PROGRESS NOTE  DATE:  10/03/13  FACILITY:  [facility]  LEVEL OF CARE: SNF  Routine Visit  CHIEF COMPLAINT:  Manage CHF, BPH and atrial fibrillation  HISTORY OF PRESENT ILLNESS:   REASSESSMENT OF ONGOING PROBLEMS:  FTT: The failure to thrive remains stable.  No complications reported from the medication(s) currently being used.  Last weight: 121.4 lbs  CHF:The patient does not relate significant weight changes, denies sob, DOE, orthopnea, PNDs, pedal edema, palpitations or chest pain.  CHF remains stable.  No complications form the medications being used.  CONSTIPATION: The constipation remains stable. No complications from the medications presently being used. Patient denies ongoing constipation, abdominal pain, nausea or vomiting.  PAST MEDICAL HISTORY : Reviewed.  No changes.  CURRENT MEDICATIONS: Reviewed per [facility]  REVIEW OF SYSTEMS:  GENERAL: no change in appetite, no fatigue, no weight changes, no fever, chills or weakness RESPIRATORY: no cough, SOB, DOE, wheezing, hemoptysis CARDIAC: no chest pain, or palpitations.  +edema GI: no abdominal pain, diarrhea, constipation, heart burn, nausea or vomiting  PHYSICAL EXAMINATION  GENERAL: no acute distress, normal body habitus NECK: supple, trachea midline, no thyroid tenderness, no thyromegaly RESPIRATORY: breathing is even & unlabored, BS CTAB CARDIAC: irregularly irregular heart rate, no murmur,no extra heart sounds, +2 bilateral lower extremity edema GI: abdomen soft, normal BS, no masses, no tenderness, no hepatomegaly, no splenomegaly EXTREMITIES: able to move all 4 extremities; uses wheelchair to move around; able to transfer self PSYCHIATRIC: the patient is alert & oriented to person, affect & behavior appropriate  LABS/RADIOLOGY: 09/26/13  Wbc 6.0  hgb 12.9  hct 39.1 08/27/13  NA 131  K 4.6  Glucose 83  BUN 27  Creatinine 0.7   CA 8.9 07/16/13 sodium 131 potassium 4.1 glucose 98 BUN 28 creatinine 0.9 calcium 8.9 06/05/13 NA131   K4.5   glucose138   BUN19   Creatinine0.7   CA9.0 05/07/13 sodium 1:30 potassium 4.8 glucose 106 BUN 24 creatinine 0.7 calcium 8.8 WBC 7.1 hemoglobin 11.5 hematocrit 35.3 04/12/13 sodium 132 potassium 3.9 glucose 127 BUN 23 creatinine 0.8 calcium 8.7 WBC 8.8 hemoglobin 10.0 hematocrit 40.2 10-14 hemoglobin 12.5, MCV 94.9 otherwise CBC normal, chloride 94, CO2 32, glucose 106 otherwise BMP normal 7/14 BMP normal, liver profile normal, TSH 1.816, hemoglobin 12.8, MCV 90.7 otherwise CBC normal 3/14 hemoglobin 12.5, MCV 91.9 otherwise CBC normal 1/14 CMP normal  ASSESSMENT/PLAN:  Chronic diastolic CHF - stable; continue Lasix  Chronic atrial fibrillation - rate controlled BPH - continue Avodart Constipation - no complaints; continue Miralax GERD-well-controlled; continue Protonix Failure to thrive - hospice/comfort care Merkel's Cell Cancer - hospice/comfort care; he wants to be full code at this time Hyponatremia - stable Urinary urgency - continue oxybutynin   CPT CODE: 76195  Ruble Buttler Vargas - NP [facility] [phone]

## 2013-10-17 ENCOUNTER — Encounter: Payer: Self-pay | Admitting: *Deleted

## 2013-10-24 ENCOUNTER — Non-Acute Institutional Stay (SKILLED_NURSING_FACILITY): Payer: Medicare Other | Admitting: Internal Medicine

## 2013-10-24 DIAGNOSIS — I4891 Unspecified atrial fibrillation: Secondary | ICD-10-CM

## 2013-10-24 DIAGNOSIS — I482 Chronic atrial fibrillation, unspecified: Secondary | ICD-10-CM

## 2013-10-24 DIAGNOSIS — N400 Enlarged prostate without lower urinary tract symptoms: Secondary | ICD-10-CM

## 2013-10-24 DIAGNOSIS — R627 Adult failure to thrive: Secondary | ICD-10-CM

## 2013-10-24 DIAGNOSIS — I509 Heart failure, unspecified: Secondary | ICD-10-CM

## 2013-10-24 DIAGNOSIS — I5032 Chronic diastolic (congestive) heart failure: Secondary | ICD-10-CM

## 2013-10-24 DIAGNOSIS — IMO0002 Reserved for concepts with insufficient information to code with codable children: Secondary | ICD-10-CM

## 2013-10-24 NOTE — Progress Notes (Signed)
        PROGRESS NOTE  DATE: 10-24-13  FACILITY: [facility]  LEVEL OF CARE: SNF  Routine Visit  CHIEF COMPLAINT:  Manage failure to thrive, CHF and atrial fibrillation  HISTORY OF PRESENT ILLNESS:  REASSESSMENT OF ONGOING PROBLEMS:  FTT: The failure to thrive remains stable.  No complications reported from the medication(s) currently being used. Patient has had a significant weight loss. He is now hospice.  CHF:The patient does not relate significant weight changes, denies sob, DOE, orthopnea, PNDs, palpitations or chest pain.  CHF remains stable.  No complications form the medications being used.  Complains of mild lower extremity swelling.  ATRIAL FIBRILLATION: the patients a-fib remains stable.  The patient denies DOE, tachycardia, orthopnea, transient neurological sx, palpitations, & PNDs.  No complications noted from the medications currently being used.  PAST MEDICAL HISTORY : Reviewed.  No changes.  CURRENT MEDICATIONS: Reviewed per [facility]  REVIEW OF SYSTEMS:  GENERAL: no change in appetite, no fatigue, no weight changes, no fever, chills or weakness RESPIRATORY: no cough, SOB, DOE,, wheezing, hemoptysis CARDIAC: no chest pain, or palpitations.  C/o LE swelling. GI: no abdominal pain, diarrhea, constipation, heart burn, nausea or vomiting  PHYSICAL EXAMINATION  VS: See vital signs section  GENERAL: no acute distress, normal body habitus EYES: Normal sclerae, normal conjunctivae, no discharge NECK: supple, trachea midline, no neck masses, no thyroid tenderness, no thyromegaly LYMPHATICS: No cervical lymphadenopathy, no supraclavicular lymphadenopathy RESPIRATORY: breathing is even & unlabored, BS CTAB CARDIAC: RRR, no murmur,no extra heart sounds, +1 bilateral lower extremity edema GI: abdomen soft, normal BS, no masses, no tenderness, no hepatomegaly, no splenomegaly PSYCHIATRIC: the patient is alert & oriented to person, affect & behavior  appropriate  LABS/RADIOLOGY: 5-15 hemoglobin 12.9, MCV 95.4 otherwise CBC normal, BUN 27, chloride 93 otherwise BMP normal 10-14 hemoglobin 12.5, MCV 94.9 otherwise CBC normal, chloride 94, CO2 32, glucose 106 otherwise BMP normal  7/14 BMP normal, liver profile normal, TSH 1.816, hemoglobin 12.8, MCV 90.7 otherwise CBC normal  3/14 hemoglobin 12.5, MCV 91.9 otherwise CBC normal 1/14 CMP normal  ASSESSMENT/PLAN:  Failure to  Thrive-now on hospice CHF-well-controlled. atrial fibrillation-rate controlled. BPH-denies symptoms. constipation-well-controlled. anemia of chronic disease-stable. GERD-well-controlled. Urinary incontinence-continue oxybutynin  Check liver profile  CPT CODE: 37106  Edgar Frisk. Durwin Reges, [place] [phone]

## 2013-11-11 ENCOUNTER — Other Ambulatory Visit: Payer: Self-pay | Admitting: Dermatology

## 2013-11-21 ENCOUNTER — Encounter: Payer: Self-pay | Admitting: Cardiology

## 2013-11-27 ENCOUNTER — Other Ambulatory Visit: Payer: Self-pay | Admitting: *Deleted

## 2013-11-27 ENCOUNTER — Encounter: Payer: Self-pay | Admitting: Adult Health

## 2013-11-27 ENCOUNTER — Non-Acute Institutional Stay (SKILLED_NURSING_FACILITY): Payer: Medicare Other | Admitting: Adult Health

## 2013-11-27 DIAGNOSIS — I482 Chronic atrial fibrillation, unspecified: Secondary | ICD-10-CM

## 2013-11-27 DIAGNOSIS — R627 Adult failure to thrive: Secondary | ICD-10-CM

## 2013-11-27 DIAGNOSIS — I5032 Chronic diastolic (congestive) heart failure: Secondary | ICD-10-CM

## 2013-11-27 DIAGNOSIS — K5900 Constipation, unspecified: Secondary | ICD-10-CM

## 2013-11-27 DIAGNOSIS — K219 Gastro-esophageal reflux disease without esophagitis: Secondary | ICD-10-CM

## 2013-11-27 DIAGNOSIS — R3915 Urgency of urination: Secondary | ICD-10-CM

## 2013-11-27 DIAGNOSIS — C4A9 Merkel cell carcinoma, unspecified: Secondary | ICD-10-CM

## 2013-11-27 DIAGNOSIS — E876 Hypokalemia: Secondary | ICD-10-CM

## 2013-11-27 DIAGNOSIS — N400 Enlarged prostate without lower urinary tract symptoms: Secondary | ICD-10-CM

## 2013-11-27 DIAGNOSIS — I509 Heart failure, unspecified: Secondary | ICD-10-CM

## 2013-11-27 DIAGNOSIS — I4891 Unspecified atrial fibrillation: Secondary | ICD-10-CM

## 2013-11-27 MED ORDER — ALPRAZOLAM 0.25 MG PO TABS: ORAL_TABLET | ORAL | Status: AC

## 2013-11-27 NOTE — Progress Notes (Signed)
Patient ID: [name], male   DOB: [birth year], 80 y.o.   MRN: [id number]        PROGRESS NOTE  DATE:   11/27/13 FACILITY:  [facility]  LEVEL OF CARE: SNF  Routine Visit  CHIEF COMPLAINT:  Manage failure to thrive, CHF and atrial fibrillation  HISTORY OF PRESENT ILLNESS:  REASSESSMENT OF ONGOING PROBLEMS:  BPH: The patient's BPH remains stable. Patient denies urinary hesitancy or dribbling. No complications reported from the current medications being used.  GERD: pt's GERD is stable.  Denies ongoing heartburn, abd. Pain, nausea or vomiting.  Currently on a PPI & tolerates it without any adverse reactions.  CHF:The patient does not relate significant weight changes, denies sob, DOE, orthopnea, PNDs, palpitations or chest pain.  CHF remains stable.  No complications form the medications being used.  Complains of mild lower extremity swelling.  PAST MEDICAL HISTORY : Reviewed.  No changes.  CURRENT MEDICATIONS: Reviewed per [facility]  REVIEW OF SYSTEMS:  GENERAL: no change in appetite, no fatigue, no weight changes, no fever, chills or weakness RESPIRATORY: no cough, SOB, DOE,, wheezing, hemoptysis CARDIAC: no chest pain, or palpitations.  C/o LE swelling. GI: no abdominal pain, diarrhea, constipation, heart burn, nausea or vomiting  PHYSICAL EXAMINATION  GENERAL: no acute distress, normal body habitus NECK: supple, trachea midline, no neck masses, no thyroid tenderness, no thyromegaly RESPIRATORY: breathing is even & unlabored, BS CTAB CARDIAC: RRR, no murmur,no extra heart sounds, +1 bilateral lower extremity edema GI: abdomen soft, normal BS, no masses, no tenderness, no hepatomegaly, no splenomegaly PSYCHIATRIC: the patient is alert & oriented to person, affect & behavior appropriate  LABS/RADIOLOGY: 11/22/13  TSH 1.3790 WBC 27.5 hemoglobin 13.5 hematocrit 41.5 sodium 134 potassium 2.6 glucose 127 BUN 34 creatinine 0.9 calcium 9.7 11/20/13  sodium 133 potassium 2.9  glucose 90 BUN 28 creatinine 0.7 calcium 9.0 5-15 hemoglobin 12.9, MCV 95.4 otherwise CBC normal, BUN 27, chloride 93 otherwise BMP normal 10-14 hemoglobin 12.5, MCV 94.9 otherwise CBC normal, chloride 94, CO2 32, glucose 106 otherwise BMP normal 7/14 BMP normal, liver profile normal, TSH 1.816, hemoglobin 12.8, MCV 90.7 otherwise CBC normal 3/14 hemoglobin 12.5, MCV 91.9 otherwise CBC normal 1/14 CMP normal  ASSESSMENT/PLAN:  Failure to  Thrive-now on hospice Merkel Cell CA, metastatic - comfort care; patient wishes to be full code but doesn't want to be hospitalized. Diastolic CHF- well-controlled. atrial fibrillation-rate controlled. BPH-denies symptoms. constipation-well-controlled. GERD-well-controlled. Urinary incontinence-continue oxybutynin  Hypokalemia - increase KCL 10% 20 meq/15 ml PO TID; bmp in 1 week   CPT CODE: 59563  Seth Bake - NP [facility] [phone]

## 2013-11-27 NOTE — Telephone Encounter (Signed)
[facility] 

## 2013-11-29 ENCOUNTER — Other Ambulatory Visit: Payer: Self-pay | Admitting: *Deleted

## 2013-11-29 MED ORDER — HYDROCODONE-ACETAMINOPHEN 5-325 MG PO TABS: 1.0000 | ORAL_TABLET | ORAL | Status: AC | PRN

## 2013-11-29 NOTE — Telephone Encounter (Signed)
[facility] 

## 2013-12-02 ENCOUNTER — Other Ambulatory Visit: Payer: Self-pay | Admitting: *Deleted

## 2013-12-02 MED ORDER — AMBULATORY NON FORMULARY MEDICATION: Status: DC

## 2013-12-02 MED ORDER — LORAZEPAM 1 MG PO TABS: ORAL_TABLET | ORAL | Status: AC

## 2013-12-02 MED ORDER — AMBULATORY NON FORMULARY MEDICATION: Status: AC

## 2013-12-02 NOTE — Telephone Encounter (Signed)
[facility] 

## 2013-12-02 NOTE — Telephone Encounter (Signed)
rx faxed to [facility] @ [phone]. 

## 2013-12-10 ENCOUNTER — Telehealth: Payer: Self-pay

## 2013-12-10 NOTE — Telephone Encounter (Signed)
Patient died @ [facility] per [employer] [facility]

## 2013-12-14 DEATH — deceased

## 2014-01-24 IMAGING — CR DG CHEST 2V
2 series · 2 of 2 positions shown · non-contrast
Comparison: 03/03/2011.  11/03/2010. CT 08/22/2010.

CLINICAL DATA: Shortness of breath.  Controlled hypertension.
History of congestive heart failure.  Former smoker.

CHEST - 2 VIEW

[w chest lat]
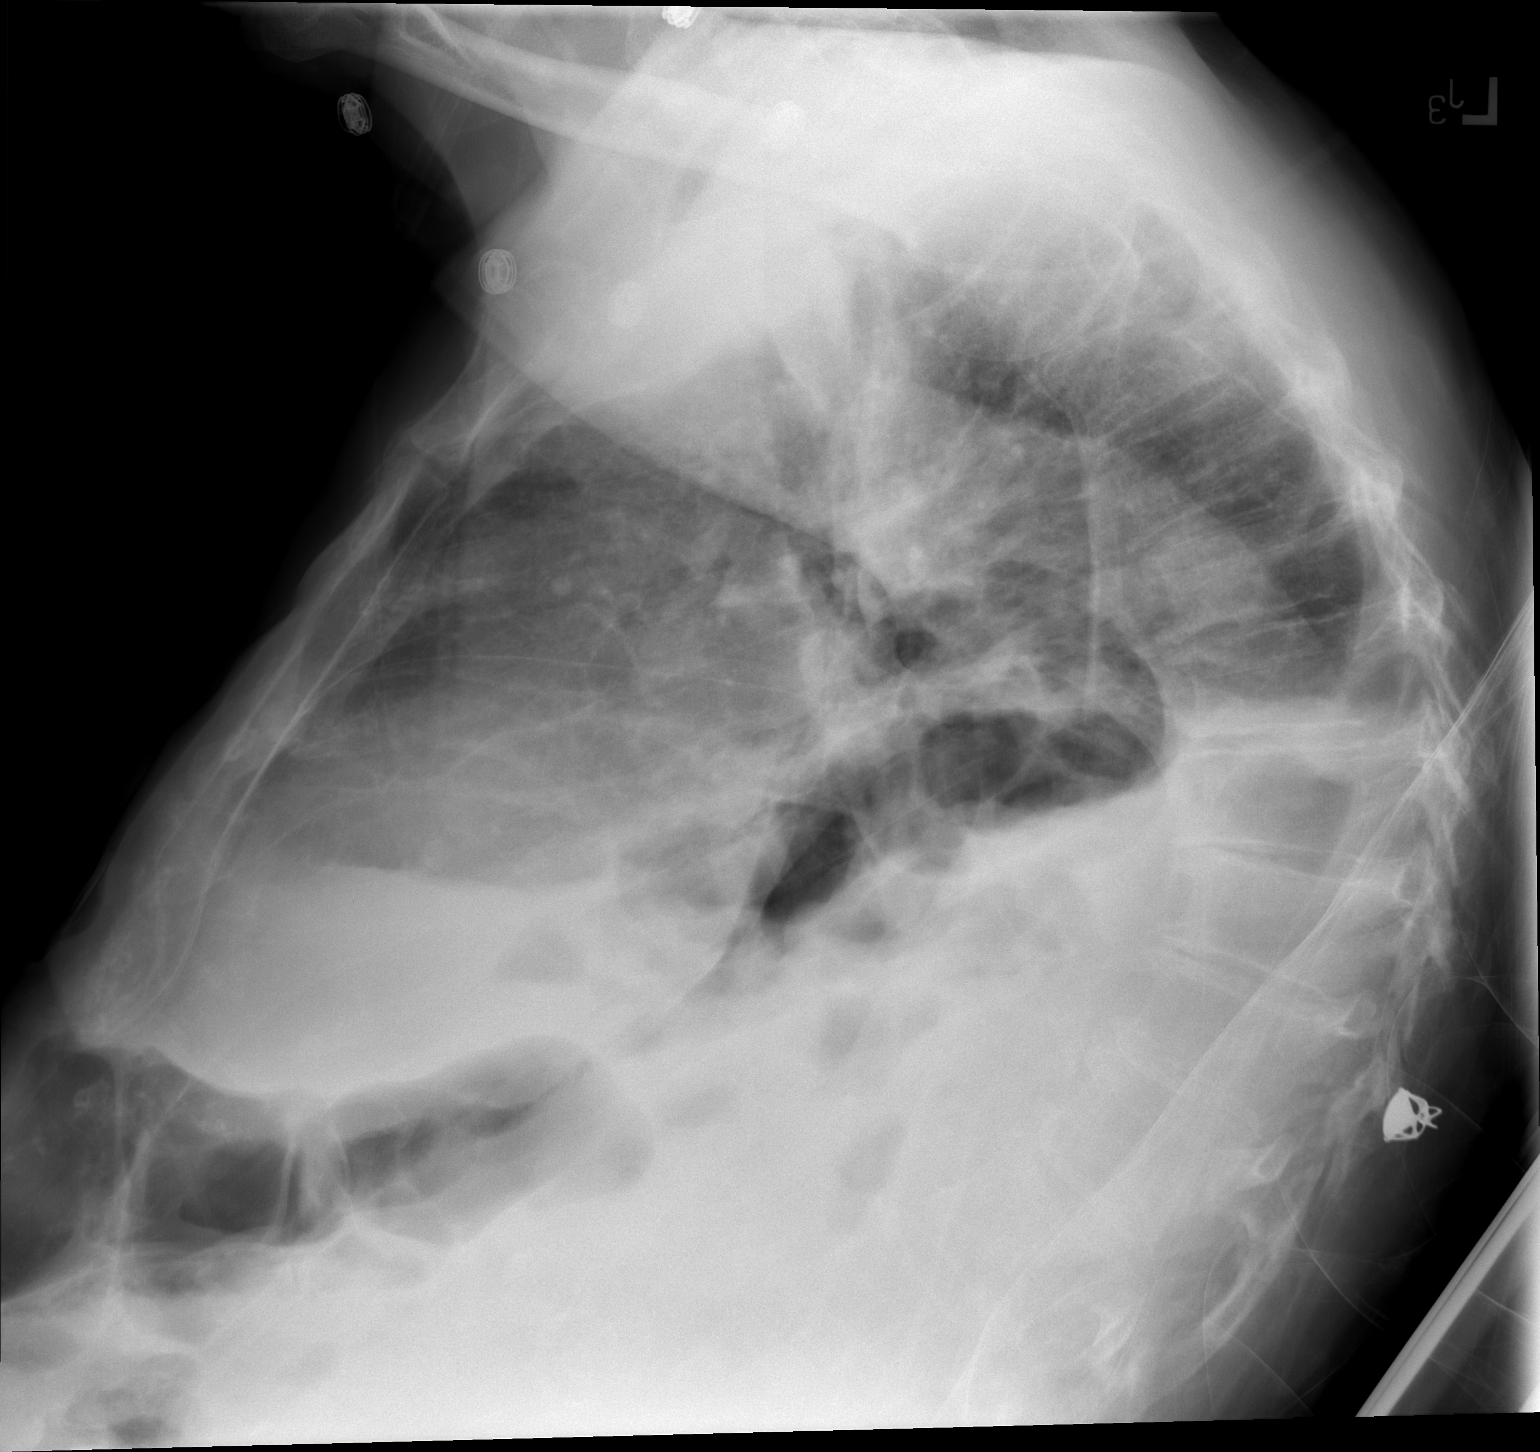

[view not recorded]
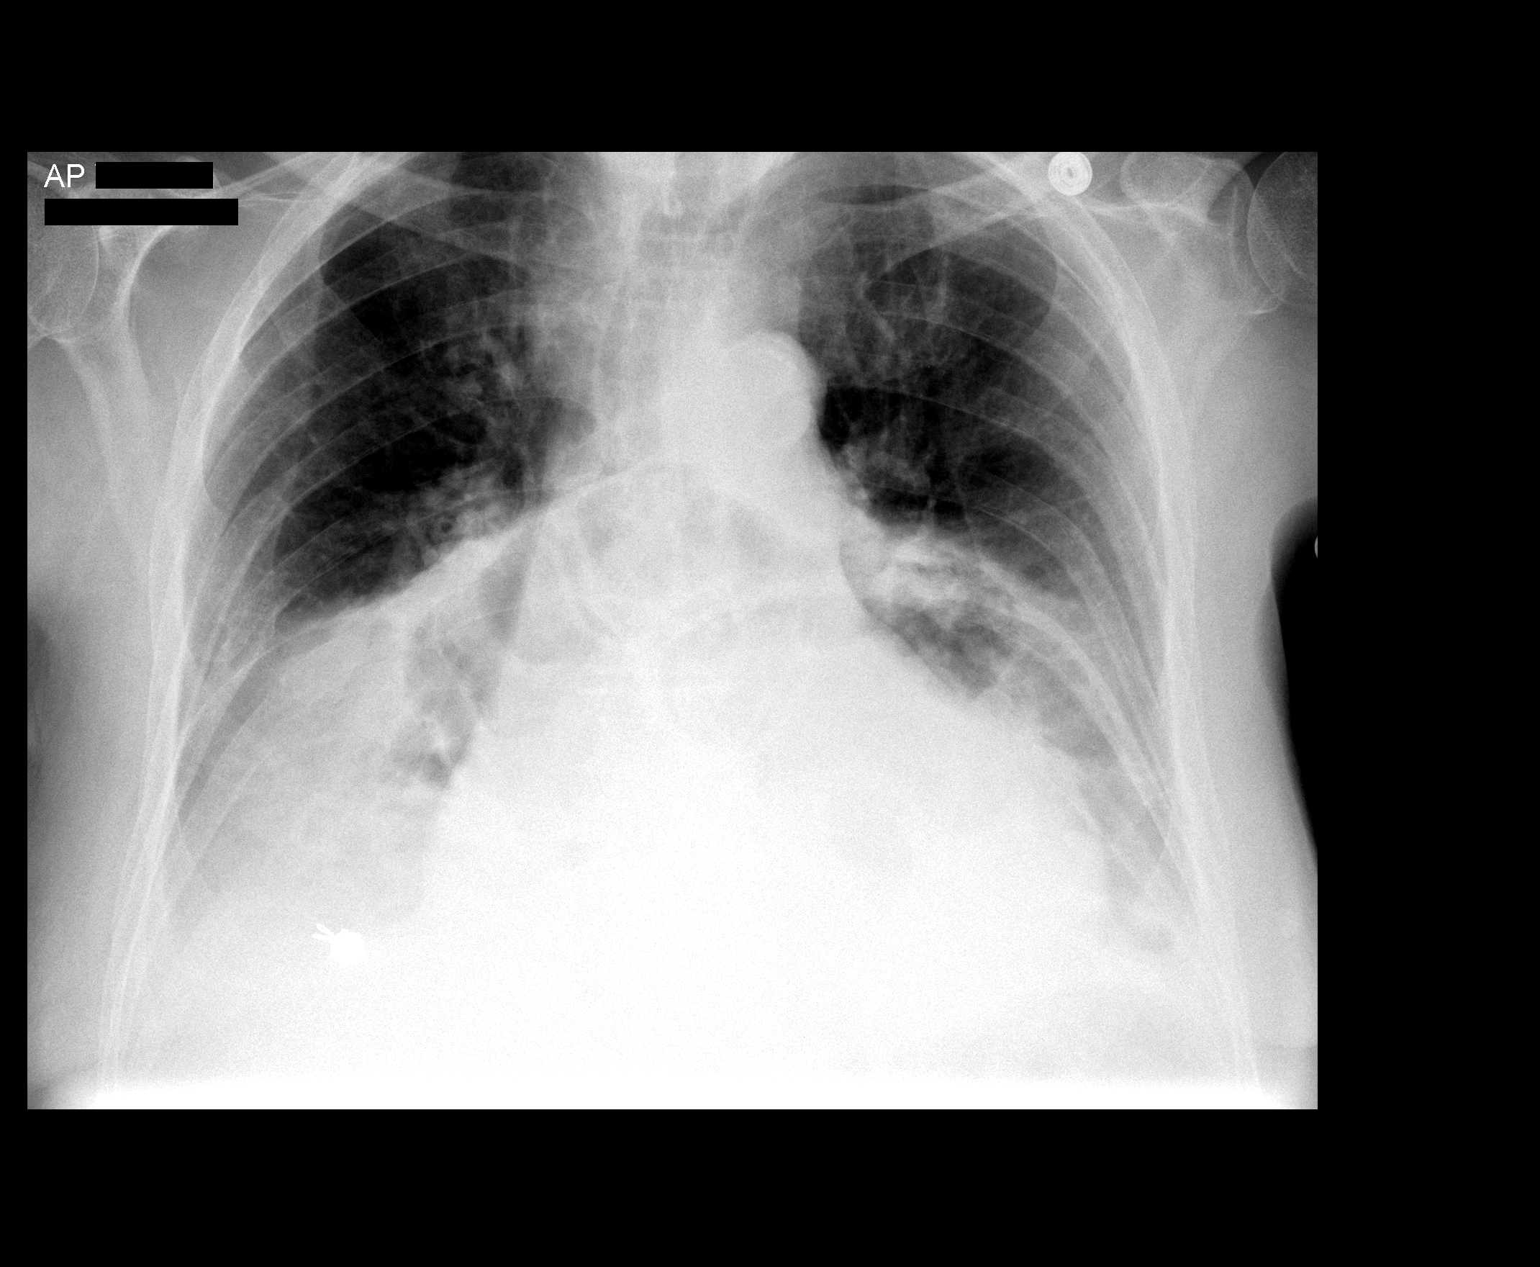

[2 of 2 positions shown; findings below may reference images not displayed]

FINDINGS: There is stable cardiac silhouette enlargement. Ectasia
and nonaneurysmal calcification of the thoracic aorta are seen.

On previous CT a large hiatal hernia was demonstrated with the
majority the stomach and a large portion of the transverse colon in
the lower chest.  There appears to be of similar configuration on
the current examination with bibasilar atelectasis.  No
consolidation or pleural effusion is demonstrated.  There is
osteopenic appearance of the bones with changes of degenerative
disc disease and degenerative spondylosis. Old healed rib trauma is
present.
IMPRESSION: Stable cardiac silhouette enlargement.

On previous CT a large hiatal hernia was demonstrated with the
majority the stomach and a large portion of the transverse colon in
the lower chest.  There appears to be of similar configuration on
the current examination.

There is an element of bibasilar atelectasis associated with the
large hiatal hernia.  No consolidation or pleural effusion is seen.
Chronic vascular and bony findings are stable and are detailed
above.

## 2014-01-29 ENCOUNTER — Ambulatory Visit: Admitting: Cardiology

## 2014-03-25 ENCOUNTER — Ambulatory Visit: Payer: Medicare Other | Admitting: Radiation Oncology
